# Patient Record
Sex: Female | Born: 1980 | Race: White | Hispanic: No | Smoking: Current every day smoker
Health system: Southern US, Community
[De-identification: ages and names within clinical notes are randomized; demographics above are authoritative.]

## PROBLEM LIST (undated history)

## (undated) DIAGNOSIS — I639 Cerebral infarction, unspecified: Secondary | ICD-10-CM

## (undated) DIAGNOSIS — F4310 Post-traumatic stress disorder, unspecified: Secondary | ICD-10-CM

## (undated) DIAGNOSIS — D332 Benign neoplasm of brain, unspecified: Secondary | ICD-10-CM

## (undated) DIAGNOSIS — F609 Personality disorder, unspecified: Secondary | ICD-10-CM

## (undated) DIAGNOSIS — F319 Bipolar disorder, unspecified: Secondary | ICD-10-CM

## (undated) DIAGNOSIS — R569 Unspecified convulsions: Secondary | ICD-10-CM

## (undated) DIAGNOSIS — F1910 Other psychoactive substance abuse, uncomplicated: Secondary | ICD-10-CM

## (undated) HISTORY — PX: TUMOR REMOVAL: SHX12

## (undated) HISTORY — PX: TUBAL LIGATION: SHX77

## (undated) HISTORY — PX: WRIST SURGERY: SHX841

## (undated) HISTORY — PX: LEG SURGERY: SHX1003

---

## 2008-05-12 ENCOUNTER — Emergency Department (HOSPITAL_BASED_OUTPATIENT_CLINIC_OR_DEPARTMENT_OTHER): Admission: EM | Admit: 2008-05-12 | Discharge: 2008-05-12 | Payer: Self-pay | Admitting: Emergency Medicine

## 2009-01-16 ENCOUNTER — Emergency Department (HOSPITAL_BASED_OUTPATIENT_CLINIC_OR_DEPARTMENT_OTHER): Admission: EM | Admit: 2009-01-16 | Discharge: 2009-01-16 | Payer: Self-pay | Admitting: Emergency Medicine

## 2009-01-16 ENCOUNTER — Ambulatory Visit: Payer: Self-pay | Admitting: Diagnostic Radiology

## 2009-06-24 ENCOUNTER — Emergency Department (HOSPITAL_BASED_OUTPATIENT_CLINIC_OR_DEPARTMENT_OTHER): Admission: EM | Admit: 2009-06-24 | Discharge: 2009-06-25 | Payer: Self-pay | Admitting: Emergency Medicine

## 2009-06-25 ENCOUNTER — Ambulatory Visit: Payer: Self-pay | Admitting: Radiology

## 2009-08-17 ENCOUNTER — Emergency Department (HOSPITAL_BASED_OUTPATIENT_CLINIC_OR_DEPARTMENT_OTHER): Admission: EM | Admit: 2009-08-17 | Discharge: 2009-08-17 | Payer: Self-pay | Admitting: Emergency Medicine

## 2009-08-17 ENCOUNTER — Ambulatory Visit: Payer: Self-pay | Admitting: Diagnostic Radiology

## 2010-05-11 LAB — URINALYSIS, ROUTINE W REFLEX MICROSCOPIC
Hgb urine dipstick: NEGATIVE
Ketones, ur: 40 mg/dL — AB
Protein, ur: 100 mg/dL — AB
Urobilinogen, UA: 4 mg/dL — ABNORMAL HIGH (ref 0.0–1.0)

## 2010-05-11 LAB — URINE MICROSCOPIC-ADD ON

## 2010-05-11 LAB — URINE CULTURE: Colony Count: >=100000

## 2010-05-28 LAB — URINALYSIS, ROUTINE W REFLEX MICROSCOPIC
Bilirubin Urine: NEGATIVE
Glucose, UA: NEGATIVE mg/dL
Hgb urine dipstick: NEGATIVE
Ketones, ur: NEGATIVE mg/dL
Nitrite: NEGATIVE
Protein, ur: NEGATIVE mg/dL
Specific Gravity, Urine: 1.02 (ref 1.005–1.030)
Urobilinogen, UA: 0.2 mg/dL (ref 0.0–1.0)

## 2010-05-28 LAB — DIFFERENTIAL
Eosinophils Relative: 0 % (ref 0–5)
Lymphocytes Relative: 11 % — ABNORMAL LOW (ref 12–46)

## 2010-05-28 LAB — CBC
Hemoglobin: 14.6 g/dL (ref 12.0–15.0)
MCHC: 34.5 g/dL (ref 30.0–36.0)
MCV: 84.2 fL (ref 78.0–100.0)
RBC: 5.03 MIL/uL (ref 3.87–5.11)
WBC: 9.7 10*3/uL (ref 4.0–10.5)

## 2010-05-28 LAB — COMPREHENSIVE METABOLIC PANEL
ALT: 17 U/L (ref 0–35)
Alkaline Phosphatase: 71 U/L (ref 39–117)
BUN: 13 mg/dL (ref 6–23)
Calcium: 9.4 mg/dL (ref 8.4–10.5)
Creatinine, Ser: 0.8 mg/dL (ref 0.4–1.2)
GFR calc Af Amer: >60 mL/min (ref >60)
GFR calc non Af Amer: >60 mL/min (ref >60)
Potassium: 4.4 mEq/L (ref 3.5–5.1)
Total Bilirubin: 0.6 mg/dL (ref 0.3–1.2)

## 2010-07-30 ENCOUNTER — Emergency Department (HOSPITAL_BASED_OUTPATIENT_CLINIC_OR_DEPARTMENT_OTHER)
Admission: EM | Admit: 2010-07-30 | Discharge: 2010-07-30 | Disposition: A | Discharge status: Home / Self Care | Payer: Medicaid Other | Attending: Emergency Medicine | Admitting: Emergency Medicine

## 2010-07-30 DIAGNOSIS — Z79899 Other long term (current) drug therapy: Secondary | ICD-10-CM | POA: Insufficient documentation

## 2010-07-30 DIAGNOSIS — L02219 Cutaneous abscess of trunk, unspecified: Secondary | ICD-10-CM | POA: Insufficient documentation

## 2010-07-30 DIAGNOSIS — L03319 Cellulitis of trunk, unspecified: Secondary | ICD-10-CM | POA: Insufficient documentation

## 2010-07-30 DIAGNOSIS — F319 Bipolar disorder, unspecified: Secondary | ICD-10-CM | POA: Insufficient documentation

## 2011-01-14 ENCOUNTER — Encounter: Payer: Self-pay | Admitting: *Deleted

## 2011-01-14 ENCOUNTER — Emergency Department (INDEPENDENT_AMBULATORY_CARE_PROVIDER_SITE_OTHER): Payer: Medicaid Other

## 2011-01-14 ENCOUNTER — Emergency Department (HOSPITAL_BASED_OUTPATIENT_CLINIC_OR_DEPARTMENT_OTHER)
Admission: EM | Admit: 2011-01-14 | Discharge: 2011-01-15 | Disposition: A | Discharge status: Home / Self Care | Payer: Medicaid Other | Attending: Emergency Medicine | Admitting: Emergency Medicine

## 2011-01-14 DIAGNOSIS — W1809XA Striking against other object with subsequent fall, initial encounter: Secondary | ICD-10-CM | POA: Insufficient documentation

## 2011-01-14 DIAGNOSIS — M7989 Other specified soft tissue disorders: Secondary | ICD-10-CM | POA: Insufficient documentation

## 2011-01-14 DIAGNOSIS — M79609 Pain in unspecified limb: Secondary | ICD-10-CM

## 2011-01-14 DIAGNOSIS — F17200 Nicotine dependence, unspecified, uncomplicated: Secondary | ICD-10-CM | POA: Insufficient documentation

## 2011-01-14 DIAGNOSIS — S82109A Unspecified fracture of upper end of unspecified tibia, initial encounter for closed fracture: Secondary | ICD-10-CM | POA: Insufficient documentation

## 2011-01-14 DIAGNOSIS — S82143A Displaced bicondylar fracture of unspecified tibia, initial encounter for closed fracture: Secondary | ICD-10-CM

## 2011-01-14 DIAGNOSIS — W19XXXA Unspecified fall, initial encounter: Secondary | ICD-10-CM

## 2011-01-14 HISTORY — DX: Unspecified convulsions: R56.9

## 2011-01-14 MED ORDER — HYDROMORPHONE HCL PF 1 MG/ML IJ SOLN
1.0000 mg | Freq: Once | INTRAMUSCULAR | Status: AC
  Administered 2011-01-14: 1 mg via INTRAVENOUS
  Filled 2011-01-14: qty 1

## 2011-01-14 MED ORDER — ONDANSETRON HCL 4 MG/2ML IJ SOLN
4.0000 mg | Freq: Once | INTRAMUSCULAR | Status: AC
  Administered 2011-01-14: 4 mg via INTRAVENOUS
  Filled 2011-01-14: qty 2

## 2011-01-14 MED ORDER — OXYCODONE-ACETAMINOPHEN 5-325 MG PO TABS
2.0000 | ORAL_TABLET | Freq: Once | ORAL | Status: AC
  Administered 2011-01-15: 2 via ORAL
  Filled 2011-01-14: qty 2

## 2011-01-14 MED ORDER — OXYCODONE-ACETAMINOPHEN 5-325 MG PO TABS: 2.0000 | ORAL_TABLET | ORAL | Status: AC | PRN

## 2011-01-14 MED ORDER — MORPHINE SULFATE 4 MG/ML IJ SOLN
6.0000 mg | Freq: Once | INTRAMUSCULAR | Status: AC
  Administered 2011-01-14: 4 mg via INTRAVENOUS
  Filled 2011-01-14: qty 1

## 2011-01-14 MED ORDER — MORPHINE SULFATE 2 MG/ML IJ SOLN
INTRAMUSCULAR | Status: AC
  Administered 2011-01-14: 2 mg via INTRAVENOUS
  Filled 2011-01-14: qty 1

## 2011-01-14 MED ORDER — LIDOCAINE HCL (PF) 1 % IJ SOLN
INTRAMUSCULAR | Status: AC
  Administered 2011-01-15: 2 mL via SUBCUTANEOUS
  Filled 2011-01-14: qty 5

## 2011-01-14 NOTE — ED Notes (Signed)
Family at bedside. 

## 2011-01-14 NOTE — ED Notes (Signed)
Pt presents to ED today after jumping a ditch playing with her kids.  Pt has noted deformity and swelling to outer aspect of RLE.  Pt reports pain extending up to knee and around to inner aspect.  No pain or swelling noted to foot

## 2011-01-14 NOTE — ED Provider Notes (Addendum)
History     CSN: [id number] Arrival date & time: 01/14/2011  6:56 PM   First MD Initiated Contact with Patient 01/14/11 1920      Chief Complaint  Patient presents with  . Leg Injury    (Consider location/radiation/quality/duration/timing/severity/associated sxs/prior treatment) HPI Comments: Pt did not hit head.  No neck or back pain  Patient is a 30 y.o. female presenting with fall.  Fall The accident occurred less than 1 hour ago. The fall occurred while walking (pt was trying to jump over ditch, hit root, twisted knee). She landed on dirt. The point of impact was the right knee. The pain is present in the right knee. The pain is severe. She was not ambulatory at the scene. Pertinent negatives include no fever, no numbness, no abdominal pain, no nausea, no vomiting, no hematuria, no headaches and no loss of consciousness. She has tried nothing for the symptoms.    Past Medical History  Diagnosis Date  . Seizures     Past Surgical History  Procedure Date  . Tubal ligation   . Tumor removal     No family history on file.  History  Substance Use Topics  . Smoking status: Current Everyday Smoker  . Smokeless tobacco: Not on file  . Alcohol Use: Yes    OB History    Grav Para Term Preterm Abortions TAB SAB Ect Mult Living                  Review of Systems  Constitutional: Negative for fever, chills, diaphoresis and fatigue.  HENT: Negative for congestion, rhinorrhea and sneezing.   Eyes: Negative.   Respiratory: Negative for cough, chest tightness and shortness of breath.   Cardiovascular: Negative for chest pain and leg swelling.  Gastrointestinal: Negative for nausea, vomiting, abdominal pain, diarrhea and blood in stool.  Genitourinary: Negative for frequency, hematuria, flank pain and difficulty urinating.  Musculoskeletal: Positive for joint swelling. Negative for back pain and arthralgias.  Skin: Negative for rash.  Neurological: Negative for dizziness,  loss of consciousness, speech difficulty, weakness, numbness and headaches.    Allergies  Sulfa antibiotics and Vicodin  Home Medications   Current Outpatient Rx  Name Route Sig Dispense Refill  . CLONAZEPAM 1 MG PO TABS Oral Take 1 mg by mouth 2 (two) times daily as needed. For anxiety     . ESCITALOPRAM OXALATE 20 MG PO TABS Oral Take 20 mg by mouth daily.      Marland Kitchen HYDROXYZINE PAMOATE 25 MG PO CAPS Oral Take 25 mg by mouth 2 (two) times daily.      Marland Kitchen LEVETIRACETAM 750 MG PO TABS Oral Take 750 mg by mouth every 12 (twelve) hours.      . TOPIRAMATE 50 MG PO TABS Oral Take 50-100 mg by mouth 2 (two) times daily. Take 1 tab in the morning and 2 tabs in the evening     . OXYCODONE-ACETAMINOPHEN 5-325 MG PO TABS Oral Take 2 tablets by mouth every 4 (four) hours as needed for pain. 30 tablet 0    BP 110/81  Pulse 98  Temp 98.9 F (37.2 C)  Resp 18  Ht 5\' 6"  (1.676 m)  Wt 130 lb (58.968 kg)  BMI 20.98 kg/m2  SpO2 99%  LMP 12/14/2010  Physical Exam  Constitutional: She is oriented to person, place, and time. She appears well-developed and well-nourished.  HENT:  Head: Normocephalic and atraumatic.  Eyes: Pupils are equal, round, and reactive to light.  Neck:  Normal range of motion. Neck supple.  Cardiovascular: Normal rate, regular rhythm and normal heart sounds.   Pulmonary/Chest: Effort normal and breath sounds normal. No respiratory distress. She has no wheezes. She has no rales. She exhibits no tenderness.  Abdominal: Soft. Bowel sounds are normal. There is no tenderness. There is no rebound and no guarding.  Musculoskeletal: Normal range of motion. She exhibits no edema.       Moderate swelling to knee/tib fib area on right.  Compartments soft.  Able to move foot without significant pain to compartments.  Good pulses in foot, normal sensation.  No open wounds  Lymphadenopathy:    She has no cervical adenopathy.  Neurological: She is alert and oriented to person, place, and time.   Skin: Skin is warm and dry. No rash noted.  Psychiatric: She has a normal mood and affect.    ED Course  ARTHOCENTESIS Date/Time: 01/14/2011 11:30 PM Performed by: Rolan Bucco Authorized by: Rolan Bucco Consent: Verbal consent obtained. Risks and benefits: risks, benefits and alternatives were discussed Consent given by: patient Patient understanding: patient states understanding of the procedure being performed Patient consent: the patient's understanding of the procedure matches consent given Procedure consent: procedure consent matches procedure scheduled Patient identity confirmed: verbally with patient Time out: Immediately prior to procedure a "time out" was called to verify the correct patient, procedure, equipment, support staff and site/side marked as required. Indications: joint swelling and pain  Body area: knee Joint: right knee Local anesthesia used: yes Anesthesia: local infiltration Local anesthetic: lidocaine 1% without epinephrine Anesthetic total: 3 ml Patient sedated: no Needle gauge: 18 G Approach: lateral Aspirate: bloody Aspirate amount: 50 ml Patient tolerance: Patient tolerated the procedure well with no immediate complications. Comments: Pain was improved after procedure   (including critical care time)  Labs Reviewed - No data to display Dg Tibia/fibula Right  01/14/2011  *RADIOLOGY REPORT*  Clinical Data: Fall, pain and swelling.  RIGHT TIBIA AND FIBULA - 2 VIEW  Comparison: None.  Findings: The patient has a fracture of the proximal tibia.  The fracture is best visualized exiting the medial tibial metaphysis and extending between the tibial eminences.  There is also a fracture of the fibular head.  Associated lipohemarthrosis noted.  IMPRESSION: Medial tibial plateau and fibular head fractures with associated lipohemarthrosis.  Original Report Authenticated By: Bernadene Bell. D'ALESSIO, M.D.     1. Tibial plateau fracture     Ortho paged, pt  given morphine, dilaudid for pain  MDM  Tibial plateau fx, no evidence of compartment syndrome   Spoke with Dr. Priscille Kluver who feels that pt can go home, f/u on Monday in their office.  Pt placed in knee immobilizer, advised absolutely non-weight bearing, keep elevated, ice, f/u with ortho on Monday.  Return for any increasing pain.  Advised symptoms to watch for for compartment syndrome.  Dr. Priscille Kluver recommended arthrocentesis of knee for better pain control which was done here.     Rolan Bucco, MD 01/14/11 1610  Rolan Bucco, MD 01/14/11 [id number]  Rolan Bucco, MD 01/15/11 0030

## 2011-01-14 NOTE — ED Notes (Signed)
Pt was playing with her kids and jumped over a ditch. Pt now has pain and swelling to her right lower leg just below her knee.

## 2011-01-14 NOTE — ED Notes (Signed)
Patient transported to X-ray 

## 2011-01-14 NOTE — ED Notes (Signed)
Pt given Dilaudid for unchanged pain 10/10

## 2011-01-14 NOTE — ED Notes (Signed)
Pt given additional ice bags and pillow and blankets for comfort.

## 2011-01-15 MED ORDER — LIDOCAINE HCL (PF) 1 % IJ SOLN
2.0000 mL | Freq: Once | INTRAMUSCULAR | Status: AC
  Administered 2011-01-15: 2 mL via SUBCUTANEOUS

## 2011-01-15 NOTE — ED Notes (Signed)
Extensive crutch teaching and mobility assistance giventopt adn family.  Assisted pt to car with assitance of security.

## 2012-11-21 ENCOUNTER — Emergency Department (HOSPITAL_BASED_OUTPATIENT_CLINIC_OR_DEPARTMENT_OTHER)
Admission: EM | Admit: 2012-11-21 | Discharge: 2012-11-21 | Disposition: A | Discharge status: Home / Self Care | Payer: Medicaid Other | Attending: Emergency Medicine | Admitting: Emergency Medicine

## 2012-11-21 ENCOUNTER — Encounter (HOSPITAL_BASED_OUTPATIENT_CLINIC_OR_DEPARTMENT_OTHER): Payer: Self-pay | Admitting: *Deleted

## 2012-11-21 DIAGNOSIS — X503XXA Overexertion from repetitive movements, initial encounter: Secondary | ICD-10-CM | POA: Insufficient documentation

## 2012-11-21 DIAGNOSIS — F17200 Nicotine dependence, unspecified, uncomplicated: Secondary | ICD-10-CM | POA: Insufficient documentation

## 2012-11-21 DIAGNOSIS — Y929 Unspecified place or not applicable: Secondary | ICD-10-CM | POA: Insufficient documentation

## 2012-11-21 DIAGNOSIS — Y990 Civilian activity done for income or pay: Secondary | ICD-10-CM | POA: Insufficient documentation

## 2012-11-21 DIAGNOSIS — Z8673 Personal history of transient ischemic attack (TIA), and cerebral infarction without residual deficits: Secondary | ICD-10-CM | POA: Insufficient documentation

## 2012-11-21 DIAGNOSIS — Z79899 Other long term (current) drug therapy: Secondary | ICD-10-CM | POA: Insufficient documentation

## 2012-11-21 DIAGNOSIS — Y9389 Activity, other specified: Secondary | ICD-10-CM | POA: Insufficient documentation

## 2012-11-21 DIAGNOSIS — G40909 Epilepsy, unspecified, not intractable, without status epilepticus: Secondary | ICD-10-CM | POA: Insufficient documentation

## 2012-11-21 DIAGNOSIS — IMO0002 Reserved for concepts with insufficient information to code with codable children: Secondary | ICD-10-CM | POA: Insufficient documentation

## 2012-11-21 HISTORY — DX: Cerebral infarction, unspecified: I63.9

## 2012-11-21 HISTORY — DX: Benign neoplasm of brain, unspecified: D33.2

## 2012-11-21 NOTE — ED Notes (Signed)
Pt presents with blisters on both feet from new shoes

## 2012-11-21 NOTE — ED Notes (Signed)
D/c home with rx x 1 for antibiotic/lisocaine cream. Wound care discussed

## 2012-11-21 NOTE — ED Provider Notes (Addendum)
CSN: [id number]     Arrival date & time 11/21/12  0124 History   First MD Initiated Contact with Patient 11/21/12 0138     Chief Complaint  Patient presents with  . 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 She bought some new shoes recently and worked in them for the past 2 days. She has rubbed blisters onto the dorsal medial aspects of both feet as well as the dorsal aspect of the left little toe. These blisters are open. She states she is having 9/10 pain, particularly when she walks in her shoes rubbed them. She is planning on avoiding work until these heal but requests help with the pain.  Past Medical History  Diagnosis Date  . Seizures     last seizure 2 weeks ago  . Stroke   . Brain tumor (benign)    Past Surgical History  Procedure Laterality Date  . Tubal ligation    . Tumor removal    . Leg surgery     No family history on file. History  Substance Use Topics  . Smoking status: Current Every Day Smoker    Types: Cigarettes  . Smokeless tobacco: Never Used  . Alcohol Use: No   OB History   Grav Para Term Preterm Abortions TAB SAB Ect Mult Living                 Review of Systems  All other systems reviewed and are negative.    Allergies  Sulfa antibiotics and Vicodin  Home Medications   Current Outpatient Rx  Name  Route  Sig  Dispense  Refill  . clonazePAM (KLONOPIN) 1 MG tablet   Oral   Take 1 mg by mouth 2 (two) times daily as needed. For anxiety          . escitalopram (LEXAPRO) 20 MG tablet   Oral   Take 20 mg by mouth daily.           . hydrOXYzine (VISTARIL) 25 MG capsule   Oral   Take 25 mg by mouth 2 (two) times daily.           Marland Kitchen levETIRAcetam (KEPPRA) 750 MG tablet   Oral   Take 750 mg by mouth every 12 (twelve) hours.           . topiramate (TOPAMAX) 50 MG tablet   Oral   Take 50-100 mg by mouth 2  (two) times daily. Take 1 tab in the morning and 2 tabs in the evening           BP 119/92  Pulse 100  Temp(Src) 98.3 F (36.8 C) (Oral)  Resp 20  SpO2 99%  LMP 11/07/2012  Physical Exam General: Well-developed, well-nourished female in no acute distress; appearance consistent with age of record HENT: normocephalic; atraumatic Eyes: pupils equal, round and reactive to light; extraocular muscles intact Neck: supple Heart: regular rate and rhythm Lungs: Normal respiratory effort and excursion Abdomen: soft; nondistended Extremities: No deformity; full range of motion Neurologic: Awake, alert and oriented; motor function intact in all extremities and symmetric; no facial droop Skin: Warm and dry; friction wounds of the dorsal medial aspects of both feet, approximately 0.5 cm x 3 cm without evidence of infection; friction wound of the dorsal left middle toe without evidence of infection Psychiatric: Normal mood and affect    ED Course  Procedures (including critical care time)  MDM  We'll treat with topical lidocaine 5% ointment mixed one-to-one with Neosporin ointment. Paper prescription written.    Hanley Seamen, MD 11/21/12 0141  Hanley Seamen, MD 11/21/12 9604

## 2013-06-14 ENCOUNTER — Encounter (HOSPITAL_BASED_OUTPATIENT_CLINIC_OR_DEPARTMENT_OTHER): Payer: Self-pay | Admitting: Emergency Medicine

## 2013-06-14 ENCOUNTER — Emergency Department (HOSPITAL_BASED_OUTPATIENT_CLINIC_OR_DEPARTMENT_OTHER)
Admission: EM | Admit: 2013-06-14 | Discharge: 2013-06-14 | Disposition: A | Discharge status: Home / Self Care | Payer: Medicaid Other | Attending: Emergency Medicine | Admitting: Emergency Medicine

## 2013-06-14 DIAGNOSIS — Z86011 Personal history of benign neoplasm of the brain: Secondary | ICD-10-CM | POA: Insufficient documentation

## 2013-06-14 DIAGNOSIS — F17200 Nicotine dependence, unspecified, uncomplicated: Secondary | ICD-10-CM | POA: Insufficient documentation

## 2013-06-14 DIAGNOSIS — R5383 Other fatigue: Secondary | ICD-10-CM

## 2013-06-14 DIAGNOSIS — IMO0002 Reserved for concepts with insufficient information to code with codable children: Secondary | ICD-10-CM | POA: Insufficient documentation

## 2013-06-14 DIAGNOSIS — R110 Nausea: Secondary | ICD-10-CM | POA: Insufficient documentation

## 2013-06-14 DIAGNOSIS — R5381 Other malaise: Secondary | ICD-10-CM | POA: Insufficient documentation

## 2013-06-14 DIAGNOSIS — G40909 Epilepsy, unspecified, not intractable, without status epilepticus: Secondary | ICD-10-CM | POA: Insufficient documentation

## 2013-06-14 DIAGNOSIS — Z79899 Other long term (current) drug therapy: Secondary | ICD-10-CM | POA: Insufficient documentation

## 2013-06-14 DIAGNOSIS — Z8673 Personal history of transient ischemic attack (TIA), and cerebral infarction without residual deficits: Secondary | ICD-10-CM | POA: Insufficient documentation

## 2013-06-14 DIAGNOSIS — L02414 Cutaneous abscess of left upper limb: Secondary | ICD-10-CM

## 2013-06-14 MED ORDER — IBUPROFEN 800 MG PO TABS
800.0000 mg | ORAL_TABLET | Freq: Once | ORAL | Status: AC
  Administered 2013-06-14: 800 mg via ORAL
  Filled 2013-06-14: qty 1

## 2013-06-14 MED ORDER — CEPHALEXIN 500 MG PO CAPS: 500.0000 mg | ORAL_CAPSULE | Freq: Two times a day (BID) | ORAL | Status: DC

## 2013-06-14 NOTE — ED Provider Notes (Signed)
CSN: [id number]     Arrival date & time 06/14/13  1755 History   First MD Initiated Contact with Patient 06/14/13 1805     Chief Complaint  Patient presents with  . Abscess    HPI  Heather Barton is a 33 y.o. female with a PMH of seizures, stroke and benign brain tumor who presents to the ED for evaluation of abscess. History was provided by the patient. Patient states that she has had an abscess to her left shoulder for the past week. Pain and redness has been gradually worsening. Minimal purulent drainage. Last week she had itching to her arms and back, which resulted in multiple scabs after vigorous scratching. She states she "just feet itchy." She denies any rash. She states she may have had a new soap which could have caused this. She states she has had abscesses in the past on her thighs and above her right eye. She denies any history of diabetes. She states she has been feeling tired and a little nauseated however denies any fever, chills, vomiting, abdominal pain or other concerns. She denies any IV drug use. Her tetanus is up-to-date.    Past Medical History  Diagnosis Date  . Seizures     last seizure 2 weeks ago  . Stroke   . Brain tumor (benign)    Past Surgical History  Procedure Laterality Date  . Tubal ligation    . Tumor removal    . Leg surgery     History reviewed. No pertinent family history. History  Substance Use Topics  . Smoking status: Current Every Day Smoker -- 0.50 packs/day    Types: Cigarettes  . Smokeless tobacco: Never Used  . Alcohol Use: No   OB History   Grav Para Term Preterm Abortions TAB SAB Ect Mult Living                 Review of Systems  Constitutional: Positive for fatigue. Negative for fever, chills, activity change and appetite change.  Gastrointestinal: Positive for nausea. Negative for vomiting and abdominal pain.  Musculoskeletal: Negative for arthralgias, back pain and myalgias.  Skin: Positive for color change and wound.  Negative for rash.  Neurological: Negative for weakness, light-headedness and headaches.    Allergies  Sulfa antibiotics and Vicodin  Home Medications   Prior to Admission medications   Medication Sig Start Date End Date Taking? Authorizing Provider  omeprazole (PRILOSEC) 40 MG capsule Take 40 mg by mouth daily.   Yes Historical Provider, MD  clonazePAM (KLONOPIN) 1 MG tablet Take 1 mg by mouth 2 (two) times daily as needed. For anxiety     Historical Provider, MD  dexmethylphenidate (FOCALIN XR) 15 MG 24 hr capsule Take 30 mg by mouth daily.    Historical Provider, MD  escitalopram (LEXAPRO) 20 MG tablet Take 20 mg by mouth daily.      Historical Provider, MD  gabapentin (NEURONTIN) 300 MG capsule Take 300 mg by mouth 3 (three) times daily.    Historical Provider, MD  hydrOXYzine (VISTARIL) 25 MG capsule Take 25 mg by mouth 2 (two) times daily.      Historical Provider, MD  levETIRAcetam (KEPPRA) 750 MG tablet Take 1,000 mg by mouth every 12 (twelve) hours.     Historical Provider, MD  Sertraline HCl (ZOLOFT PO) Take by mouth.    Historical Provider, MD  topiramate (TOPAMAX) 50 MG tablet Take 50-100 mg by mouth 2 (two) times daily. Take 1 tab in the morning  and 2 tabs in the evening     Historical Provider, MD   BP 135/82  Pulse 100  Temp(Src) 97.7 F (36.5 C) (Oral)  Resp 18  Ht 5\' 7"  (1.702 m)  Wt 145 lb (65.772 kg)  BMI 22.71 kg/m2  SpO2 100%  LMP 06/07/2013  Filed Vitals:   06/14/13 1758  BP: 135/82  Pulse: 100  Temp: 97.7 F (36.5 C)  TempSrc: Oral  Resp: 18  Height: 5\' 7"  (1.702 m)  Weight: 145 lb (65.772 kg)  SpO2: 100%    Physical Exam  Nursing note and vitals reviewed. Constitutional: She is oriented to person, place, and time. She appears well-developed and well-nourished. No distress.  Nontoxic  HENT:  Head: Normocephalic and atraumatic.  Right Ear: External ear normal.  Left Ear: External ear normal.  Mouth/Throat: Oropharynx is clear and moist.   Eyes: Conjunctivae are normal. Right eye exhibits no discharge. Left eye exhibits no discharge.  Neck: Neck supple.  Cardiovascular: Normal rate, regular rhythm and normal heart sounds.  Exam reveals no gallop and no friction rub.   No murmur heard. Pulmonary/Chest: Effort normal and breath sounds normal. No respiratory distress. She has no wheezes. She has no rales. She exhibits no tenderness.  Abdominal: Soft. She exhibits no distension. There is no tenderness.  Musculoskeletal: Normal range of motion. She exhibits no edema and no tenderness.       Back:  Neurological: She is alert and oriented to person, place, and time.  Skin: Skin is warm and dry. She is not diaphoretic.  4 cm x 4 cm circular area of erythema and induration to the left superior posterior shoulder. Closed scab present in the center of the area. Area is tender to palpation. No open lesion or drainage. Multiple scabs and scratch marks present in the arms and upper back. No rash    ED Course  INCISION AND DRAINAGE Date/Time: 06/14/2013 7:00 PM Performed by: Vernie Murders K Authorized by: Vernie Murders K Consent: Verbal consent obtained. Consent given by: patient Type: abscess Body area: upper extremity Location details: left shoulder Anesthesia: local infiltration Local anesthetic: lidocaine 2% without epinephrine Anesthetic total: 3 ml Patient sedated: no Risk factor: underlying major vessel Scalpel size: 11 Incision type: single straight Complexity: simple Drainage: purulent Drainage amount: scant Wound treatment: drain placed Packing material: 1/4 in gauze Patient tolerance: Patient tolerated the procedure well with no immediate complications.   (including critical care time) Labs Review Labs Reviewed - No data to display  Imaging Review No results found.   EKG Interpretation None      MDM   Heather Barton is a 33 y.o. female with a PMH of seizures, stroke and benign brain tumor who presents  to the ED for evaluation of abscess. Abscess drained in the ED and packed. Patient instructed to return to the ED in 2 days or follow-up with her PCP for packing removal. Wound care discussed with patient. Patient instructed to keep skin clean and dry and take Benadryl as needed for itching. No rash on exam. Patient afebrile and non-toxic in appearance. Due to multiple areas of skin lesions will treat with Keflex. Return precautions, discharge instructions, and follow-up was discussed with the patient before discharge.    Rechecks  7:30 PM = Patient upset about not getting anything for pain. Offered patient many options, however, she refused. Did not feel patient required narcotics at this time.     New Prescriptions   CEPHALEXIN (KEFLEX) 500 MG CAPSULE  Take 1 capsule (500 mg total) by mouth 2 (two) times daily.    Final impressions: 1. Abscess of left shoulder       Harold Hedge Evelyne Makepeace PA-C          Lucila Maine, PA-C 06/16/13 1133

## 2013-06-14 NOTE — Discharge Instructions (Signed)
Take antibiotics for full dose  Packing removal in 2 days! Warm compresses to abscess areas  Keep area clean and dry  Take Ibuprofen or Tylenol as needed for pain control  Apply antibiotic ointment to skin lesions  Avoid scratching skin - take benadryl for itching if needed  Return to the emergency department if you develop any changing/worsening condition, fever, spreading redness/swelling, increased drainage, or any other concerns (please read additional information regarding your condition below)  Abscess An abscess is an infected area that contains a collection of pus and debris.It can occur in almost any part of the body. An abscess is also known as a furuncle or boil. CAUSES  An abscess occurs when tissue gets infected. This can occur from blockage of oil or sweat glands, infection of hair follicles, or a minor injury to the skin. As the body tries to fight the infection, pus collects in the area and creates pressure under the skin. This pressure causes pain. People with weakened immune systems have difficulty fighting infections and get certain abscesses more often.  SYMPTOMS Usually an abscess develops on the skin and becomes a painful mass that is red, warm, and tender. If the abscess forms under the skin, you may feel a moveable soft area under the skin. Some abscesses break open (rupture) on their own, but most will continue to get worse without care. The infection can spread deeper into the body and eventually into the bloodstream, causing you to feel ill.  DIAGNOSIS  Your caregiver will take your medical history and perform a physical exam. A sample of fluid may also be taken from the abscess to determine what is causing your infection. TREATMENT  Your caregiver may prescribe antibiotic medicines to fight the infection. However, taking antibiotics alone usually does not cure an abscess. Your caregiver may need to make a small cut (incision) in the abscess to drain the pus. In some  cases, gauze is packed into the abscess to reduce pain and to continue draining the area. HOME CARE INSTRUCTIONS   Only take over-the-counter or prescription medicines for pain, discomfort, or fever as directed by your caregiver.  If you were prescribed antibiotics, take them as directed. Finish them even if you start to feel better.  If gauze is used, follow your caregiver's directions for changing the gauze.  To avoid spreading the infection:  Keep your draining abscess covered with a bandage.  Wash your hands well.  Do not share personal care items, towels, or whirlpools with others.  Avoid skin contact with others.  Keep your skin and clothes clean around the abscess.  Keep all follow-up appointments as directed by your caregiver. SEEK MEDICAL CARE IF:   You have increased pain, swelling, redness, fluid drainage, or bleeding.  You have muscle aches, chills, or a general ill feeling.  You have a fever. MAKE SURE YOU:   Understand these instructions.  Will watch your condition.  Will get help right away if you are not doing well or get worse. Document Released: 11/19/2004 Document Revised: 08/11/2011 Document Reviewed: 04/24/2011 [facility] Patient Information 2014 [place].   Emergency Department Resource Guide 1) Find a Doctor and Pay Out of Pocket Although you won't have to find out who is covered by your insurance plan, it is a good idea to ask around and get recommendations. You will then need to call the office and see if the doctor you have chosen will accept you as a new patient and what types of options they  offer for patients who are self-pay. Some doctors offer discounts or will set up payment plans for their patients who do not have insurance, but you will need to ask so you aren't surprised when you get to your appointment.  2) Contact Your Local Health Department Not all health departments have doctors that can see patients for sick visits, but many  do, so it is worth a call to see if yours does. If you don't know where your local health department is, you can check in your phone book. The CDC also has a tool to help you locate your state's health department, and many state websites also have listings of all of their local health departments.  3) Find a [facility] If your illness is not likely to be very severe or complicated, you may want to try a walk in clinic. These are popping up all over the country in pharmacies, drugstores, and shopping centers. They're usually staffed by nurse practitioners or physician assistants that have been trained to treat common illnesses and complaints. They're usually fairly quick and inexpensive. However, if you have serious medical issues or chronic medical problems, these are probably not your best option.  No Primary Care Doctor: - Call Health Connect at  [phone] - they can help you locate a primary care doctor that  accepts your insurance, provides certain services, etc. - Physician Referral Service- [phone]  Chronic Pain Problems: Organization         Address  Phone   Notes  [facility]  [phone] Patients need to be referred by their primary care doctor.   Medication Assistance: Organization         Address  Phone   Notes  [facility] Medication [facility] [address] [place], [place] [address] [phone] --Must be a resident of [facility] -- Must have NO insurance coverage whatsoever (no Medicaid/ Medicare, etc.) -- The pt. MUST have a primary care doctor that directs their care regularly and follows them in the community   [facility]  [phone]   [employer]  [phone]    Agencies that provide inexpensive medical care: Organization         Address  Phone   Notes  [place]  [phone]   [facility]    [phone]   [facility] [place], [place] [address] [phone]   [place] [address], [place] [phone]   Planned Parenthood    [phone]   [facility]    [phone]   [address], [place] Phone:  [phone], Fax:  [phone] Hours of Operation:  9 am - 6 pm, M-F.  Also accepts Medicaid/Medicare and self-pay.  [facility] for [address], [place] Phone: [phone], Fax: [phone]. Hours of Operation:  8:30 am - 5:30 pm, M-F.  Also accepts Medicaid and self-pay.  [facility] [address], [place] Phone: [phone]   [place], [place], [place] [phone], Ext. [phone] Mondays & Thursdays: 7-9 AM.  First 15 patients are seen on a first come, first serve basis.    [place] Providers:  Organization         Address  Phone   Notes  [facility] [address], [place] [phone] Also accepts self-pay patients.  [facility] [id number] [place], [place]  [phone]   [place], [address], [place] [phone]   [facility] [address], [place] [phone]   Lucianne Lei [address], [place]   [phone] Only accepts [place] Access [place] patients after they have their name applied to their card.   Self-Pay (no insurance) in [facility]:  Organization         Address  Phone   Notes  Sickle Cell Patients, [facility] Internal Medicine [place] [phone]   [facility] [place] [phone]   [facility] [place]  [place], [place], [place] [phone]   [facility]/Dr. Osei-Bonsu  [address] [place] or  [address], [place] [phone] Phone number for both [place] and [place] locations is the same.  Urgent Medical and [facility] [address], [place] [phone]   [facility] [address], [place] or [address] [phone]   [facility] [address] [place] [phone], phone; [phone], fax Sees patients 1st and 3rd Saturday of every month.  Must not qualify for public or private insurance (i.e. Medicaid, Medicare, [place] Health Choice, Veterans' Benefits)  Household income should be no more than 200% of the poverty level The clinic cannot treat you if you are pregnant or think you are pregnant  Sexually transmitted diseases are not treated at the clinic.    Dental Care: Organization         Address  Phone  Notes  [facility] [place] [phone] Accepts children up to age 10 who are enrolled in [place] or [place]; pregnant women with a Medicaid card; and children who have applied for Medicaid or [place] Health Choice, but were declined, whose parents can pay a reduced fee at time of service.  [facility] Department of [facility]  [address], [place] [phone] Accepts children up to age 22 who are enrolled in [place] or [place]; pregnant women with a Medicaid card; and children who have applied for Medicaid or [place] Health Choice, but were declined, whose parents can pay a reduced fee at time of service.  [place] Adult Dental Access PROGRAM  [place] [phone] Patients are seen by appointment only. Walk-ins are not accepted. [place] will see patients 61 years of age and older. Monday - Tuesday (8am-5pm) Most Wednesdays (8:30-5pm) $30 per visit, cash only  [facility] Adult Dental Access PROGRAM  [address], [facility] [phone] Patients are seen by appointment only. Walk-ins are not accepted. [place] will see patients 28 years of age and older. One Wednesday Evening (Monthly: Volunteer Based).  $30 per visit, cash only  [place]  [phone] for adults; Children under age 70, call Graduate Pediatric Dentistry at [phone]. Children aged 67-14, please call [phone] to request a pediatric application.  Dental services are provided in all areas of dental care including fillings, crowns and bridges, complete and partial dentures, implants,  gum treatment, root canals, and extractions. Preventive care is also provided. Treatment is provided to both adults and children. Patients are selected via a lottery and there is often a waiting list.   [facility] [address] [place]  [phone] [email or web address]   [facility] [address] [place], [place] [phone], Ext. [phone] Second and Fourth Thursday of each month, opens at 6:30 AM; Clinic ends at 9 AM.  Patients are seen on a first-come first-served basis, and a limited number are seen during each clinic.   [facility]  [address] Hillard Danker [place], [place] [phone]   Eligibility Requirements You must have lived in [place], [place], or [place] counties for at least the last three months.   You cannot be eligible for state or federal sponsored [employer], including [employer], [place], or [employer].   You generally cannot be eligible for healthcare insurance through your employer.    How to apply: Eligibility screenings are held every Tuesday and Wednesday afternoon from 1:00 pm until 4:00 pm. You do not need an appointment for the interview!  [facility] [address] [place], [place]   [place]  [place] Department  [place]  [phone]    Behavioral Health Resources in the Community: Intensive Outpatient Programs Organization         Address  Phone  Notes  [place] Bonsall. [address], [place], [place] [phone]   [facility] [address] [place], [place]   ADS: Alcohol & Drug S[facility] [address] [place], [place]   [place] [address]  [place], [place] or [phone]   Substance Abuse Resources Organization         Address  Phone  Notes  Alcohol and Drug Services  [phone]   [place]  [phone]   [facility] [place]   Chinita Pester  [phone]   Residential & Outpatient Substance Abuse Program  [phone]   Psychological Services Organization         Address  Phone  Notes  [facility] [place]  [place]  [phone]   [place] [address], [place] or [phone]    Mobile Crisis Teams Organization         Address  Phone  Notes  [facility], Mobile Crisis Care Unit  [phone]   [facility]  [address] [place], [place]   [place] [address], [place] [phone]    Self-Help/Support Groups Organization         Address  Phone             Notes  [place]. of [place] - variety of support groups  [place] Call for more information  Narcotics Anonymous (NA), [facility] [address], [employer] [place]  2 meetings at this location   Special educational needs teacher         Address  Phone  Notes  ASAP Residential Treatment [place],    [place]  [place]  [address], [place], [place], [place]   [place], [place] [phone]  Admissions: 8am-3pm M-F  Incentives Substance  Abuse Treatment Center [address],    [place], [place] [phone]   [facility] [address] [place], [place], [place]   [facility] [address]  [place], [place]   [facility] Programs - Intensive Outpatient [address], [place], [place]   [facility] ([place].,  [place], [place] [phone] or [phone]   [facility]idential Treatment Services (RTS) [address], [place], [place] Accepts Medicaid  [facility] [place] [address],  [place] [phone] Substance Abuse/Addiction Treatment   [facility] The Organization         Address  Phone  Notes  [facility]  [phone]   Domenic Schwab, PhD [address] Arlis Porta [place], [place]   [phone] or [phone]   [place], [place] [phone]   [facility] [address] [place], [place] [phone] Insurance/Medicaid/sponsorship through [facility] and Families [address], [address] [place]                                    [place], [place] [phone] [place] [address], [place] [phone]    Dr. Adele Schilder  [phone]   [facility] Dept. 1) [address], [place] 2) [place] 3)  [place] [address], [place] [phone]  [phone]   [place] [phone] or [phone] (After Hours)

## 2013-06-14 NOTE — ED Notes (Signed)
Pt angry that she did not get pain medication.  Asked to speak to PA.  Janett Billow informed.

## 2013-06-14 NOTE — ED Notes (Signed)
Pt angry after speaking to Webb.  Pt refused to sign out at discharge.

## 2013-06-14 NOTE — ED Notes (Signed)
Pt c/o abscess to left shoulder x 1 week

## 2013-06-17 NOTE — ED Provider Notes (Signed)
Medical screening examination/treatment/procedure(s) were performed by non-physician practitioner and as supervising physician I was immediately available for consultation/collaboration.   EKG Interpretation None        Malvin Johns, MD 06/17/13 [phone]

## 2014-03-22 ENCOUNTER — Emergency Department (HOSPITAL_BASED_OUTPATIENT_CLINIC_OR_DEPARTMENT_OTHER)
Admission: EM | Admit: 2014-03-22 | Discharge: 2014-03-22 | Disposition: A | Discharge status: Home / Self Care | Payer: Medicaid Other | Attending: Emergency Medicine | Admitting: Emergency Medicine

## 2014-03-22 ENCOUNTER — Encounter (HOSPITAL_BASED_OUTPATIENT_CLINIC_OR_DEPARTMENT_OTHER): Payer: Self-pay | Admitting: *Deleted

## 2014-03-22 DIAGNOSIS — B9789 Other viral agents as the cause of diseases classified elsewhere: Secondary | ICD-10-CM | POA: Insufficient documentation

## 2014-03-22 DIAGNOSIS — Z720 Tobacco use: Secondary | ICD-10-CM | POA: Insufficient documentation

## 2014-03-22 DIAGNOSIS — G40909 Epilepsy, unspecified, not intractable, without status epilepticus: Secondary | ICD-10-CM | POA: Insufficient documentation

## 2014-03-22 DIAGNOSIS — F4310 Post-traumatic stress disorder, unspecified: Secondary | ICD-10-CM | POA: Diagnosis not present

## 2014-03-22 DIAGNOSIS — Z792 Long term (current) use of antibiotics: Secondary | ICD-10-CM | POA: Insufficient documentation

## 2014-03-22 DIAGNOSIS — J028 Acute pharyngitis due to other specified organisms: Secondary | ICD-10-CM | POA: Insufficient documentation

## 2014-03-22 DIAGNOSIS — J029 Acute pharyngitis, unspecified: Secondary | ICD-10-CM

## 2014-03-22 DIAGNOSIS — Z8673 Personal history of transient ischemic attack (TIA), and cerebral infarction without residual deficits: Secondary | ICD-10-CM | POA: Diagnosis not present

## 2014-03-22 DIAGNOSIS — J069 Acute upper respiratory infection, unspecified: Secondary | ICD-10-CM | POA: Insufficient documentation

## 2014-03-22 DIAGNOSIS — Z79899 Other long term (current) drug therapy: Secondary | ICD-10-CM | POA: Insufficient documentation

## 2014-03-22 DIAGNOSIS — Z86011 Personal history of benign neoplasm of the brain: Secondary | ICD-10-CM | POA: Diagnosis not present

## 2014-03-22 HISTORY — DX: Post-traumatic stress disorder, unspecified: F43.10

## 2014-03-22 LAB — RAPID STREP SCREEN (MED CTR MEBANE ONLY): STREPTOCOCCUS, GROUP A SCREEN (DIRECT): NEGATIVE

## 2014-03-22 MED ORDER — SALINE SPRAY 0.65 % NA SOLN: 1.0000 | NASAL | Status: DC | PRN

## 2014-03-22 NOTE — Discharge Instructions (Signed)
Use nasal saline as prescribed along with salt water gargles. Stay well hydrated.  Salt Water Gargle This solution will help make your mouth and throat feel better. HOME CARE INSTRUCTIONS   Mix 1 teaspoon of salt in 8 ounces of warm water.  Gargle with this solution as much or often as you need or as directed. Swish and gargle gently if you have any sores or wounds in your mouth.  Do not swallow this mixture. Document Released: 11/14/2003 Document Revised: 05/04/2011 Document Reviewed: 04/06/2008 [facility] Patient Information 2015 [place], [place]. This information is not intended to replace advice given to you by your health care provider. Make sure you discuss any questions you have with your health care provider.  Pharyngitis Pharyngitis is redness, pain, and swelling (inflammation) of your pharynx.  CAUSES  Pharyngitis is usually caused by infection. Most of the time, these infections are from viruses (viral) and are part of a cold. However, sometimes pharyngitis is caused by bacteria (bacterial). Pharyngitis can also be caused by allergies. Viral pharyngitis may be spread from person to person by coughing, sneezing, and personal items or utensils (cups, forks, spoons, toothbrushes). Bacterial pharyngitis may be spread from person to person by more intimate contact, such as kissing.  SIGNS AND SYMPTOMS  Symptoms of pharyngitis include:   Sore throat.   Tiredness (fatigue).   Low-grade fever.   Headache.  Joint pain and muscle aches.  Skin rashes.  Swollen lymph nodes.  Plaque-like film on throat or tonsils (often seen with bacterial pharyngitis). DIAGNOSIS  Your health care provider will ask you questions about your illness and your symptoms. Your medical history, along with a physical exam, is often all that is needed to diagnose pharyngitis. Sometimes, a rapid strep test is done. Other lab tests may also be done, depending on the suspected cause.  TREATMENT  Viral  pharyngitis will usually get better in 3-4 days without the use of medicine. Bacterial pharyngitis is treated with medicines that kill germs (antibiotics).  HOME CARE INSTRUCTIONS   Drink enough water and fluids to keep your urine clear or pale yellow.   Only take over-the-counter or prescription medicines as directed by your health care provider:   If you are prescribed antibiotics, make sure you finish them even if you start to feel better.   Do not take aspirin.   Get lots of rest.   Gargle with 8 oz of salt water ( tsp of salt per 1 qt of water) as often as every 1-2 hours to soothe your throat.   Throat lozenges (if you are not at risk for choking) or sprays may be used to soothe your throat. SEEK MEDICAL CARE IF:   You have large, tender lumps in your neck.  You have a rash.  You cough up green, yellow-brown, or bloody spit. SEEK IMMEDIATE MEDICAL CARE IF:   Your neck becomes stiff.  You drool or are unable to swallow liquids.  You vomit or are unable to keep medicines or liquids down.  You have severe pain that does not go away with the use of recommended medicines.  You have trouble breathing (not caused by a stuffy nose). MAKE SURE YOU:   Understand these instructions.  Will watch your condition.  Will get help right away if you are not doing well or get worse. Document Released: 02/09/2005 Document Revised: 11/30/2012 Document Reviewed: 10/17/2012 [facility] Patient Information 2015 [place], [place]. This information is not intended to replace advice given to you by your health care  provider. Make sure you discuss any questions you have with your health care provider.  Upper Respiratory Infection, Adult An upper respiratory infection (URI) is also sometimes known as the common cold. The upper respiratory tract includes the nose, sinuses, throat, trachea, and bronchi. Bronchi are the airways leading to the lungs. Most people improve within 1 week, but  symptoms can last up to 2 weeks. A residual cough may last even longer.  CAUSES Many different viruses can infect the tissues lining the upper respiratory tract. The tissues become irritated and inflamed and often become very moist. Mucus production is also common. A cold is contagious. You can easily spread the virus to others by oral contact. This includes kissing, sharing a glass, coughing, or sneezing. Touching your mouth or nose and then touching a surface, which is then touched by another person, can also spread the virus. SYMPTOMS  Symptoms typically develop 1 to 3 days after you come in contact with a cold virus. Symptoms vary from person to person. They may include:  Runny nose.  Sneezing.  Nasal congestion.  Sinus irritation.  Sore throat.  Loss of voice (laryngitis).  Cough.  Fatigue.  Muscle aches.  Loss of appetite.  Headache.  Low-grade fever. DIAGNOSIS  You might diagnose your own cold based on familiar symptoms, since most people get a cold 2 to 3 times a year. Your caregiver can confirm this based on your exam. Most importantly, your caregiver can check that your symptoms are not due to another disease such as strep throat, sinusitis, pneumonia, asthma, or epiglottitis. Blood tests, throat tests, and X-rays are not necessary to diagnose a common cold, but they may sometimes be helpful in excluding other more serious diseases. Your caregiver will decide if any further tests are required. RISKS AND COMPLICATIONS  You may be at risk for a more severe case of the common cold if you smoke cigarettes, have chronic heart disease (such as heart failure) or lung disease (such as asthma), or if you have a weakened immune system. The very young and very old are also at risk for more serious infections. Bacterial sinusitis, middle ear infections, and bacterial pneumonia can complicate the common cold. The common cold can worsen asthma and chronic obstructive pulmonary disease  (COPD). Sometimes, these complications can require emergency medical care and may be life-threatening. PREVENTION  The best way to protect against getting a cold is to practice good hygiene. Avoid oral or hand contact with people with cold symptoms. Wash your hands often if contact occurs. There is no clear evidence that vitamin C, vitamin E, echinacea, or exercise reduces the chance of developing a cold. However, it is always recommended to get plenty of rest and practice good nutrition. TREATMENT  Treatment is directed at relieving symptoms. There is no cure. Antibiotics are not effective, because the infection is caused by a virus, not by bacteria. Treatment may include:  Increased fluid intake. Sports drinks offer valuable electrolytes, sugars, and fluids.  Breathing heated mist or steam (vaporizer or shower).  Eating chicken soup or other clear broths, and maintaining good nutrition.  Getting plenty of rest.  Using gargles or lozenges for comfort.  Controlling fevers with ibuprofen or acetaminophen as directed by your caregiver.  Increasing usage of your inhaler if you have asthma. Zinc gel and zinc lozenges, taken in the first 24 hours of the common cold, can shorten the duration and lessen the severity of symptoms. Pain medicines may help with fever, muscle aches, and throat  pain. A variety of non-prescription medicines are available to treat congestion and runny nose. Your caregiver can make recommendations and may suggest nasal or lung inhalers for other symptoms.  HOME CARE INSTRUCTIONS   Only take over-the-counter or prescription medicines for pain, discomfort, or fever as directed by your caregiver.  Use a warm mist humidifier or inhale steam from a shower to increase air moisture. This may keep secretions moist and make it easier to breathe.  Drink enough water and fluids to keep your urine clear or pale yellow.  Rest as needed.  Return to work when your temperature has  returned to normal or as your caregiver advises. You may need to stay home longer to avoid infecting others. You can also use a face mask and careful hand washing to prevent spread of the virus. SEEK MEDICAL CARE IF:   After the first few days, you feel you are getting worse rather than better.  You need your caregiver's advice about medicines to control symptoms.  You develop chills, worsening shortness of breath, or brown or red sputum. These may be signs of pneumonia.  You develop yellow or brown nasal discharge or pain in the face, especially when you bend forward. These may be signs of sinusitis.  You develop a fever, swollen neck glands, pain with swallowing, or white areas in the back of your throat. These may be signs of strep throat. SEEK IMMEDIATE MEDICAL CARE IF:   You have a fever.  You develop severe or persistent headache, ear pain, sinus pain, or chest pain.  You develop wheezing, a prolonged cough, cough up blood, or have a change in your usual mucus (if you have chronic lung disease).  You develop sore muscles or a stiff neck. Document Released: 08/05/2000 Document Revised: 05/04/2011 Document Reviewed: 05/17/2013 [facility] Patient Information 2015 [place], [place]. This information is not intended to replace advice given to you by your health care provider. Make sure you discuss any questions you have with your health care provider.

## 2014-03-22 NOTE — ED Notes (Signed)
Sore throat. Sore tongue. Symptoms x 1 week.

## 2014-03-22 NOTE — ED Provider Notes (Signed)
CSN: [id number]     Arrival date & time 03/22/14  1415 History   First MD Initiated Contact with Patient 03/22/14 1541     Chief Complaint  Patient presents with  . Sore Throat     (Consider location/radiation/quality/duration/timing/severity/associated sxs/prior Treatment) HPI Comments: 34 year old female presenting to the ED complaining of sore throat and sore tongue towards the back 1 week. Patient states her throat feels swollen, worse when swallowing. Denies any difficulty swallowing. Admits to associated productive cough with clear mucus. States she is congested. Mrs. subjective low-grade fevers. Denies nausea or vomiting. She states her boyfriend drank from the same cup as her and believes that is why she got sick. Her boyfriend is not sick. She has not tried any alleviating factors for her symptoms.  Patient is a 34 y.o. female presenting with pharyngitis. The history is provided by the patient.  Sore Throat Associated symptoms include chills, congestion, coughing, a fever and a sore throat.    Past Medical History  Diagnosis Date  . Seizures     last seizure 2 weeks ago  . Stroke   . Brain tumor (benign)   . PTSD (post-traumatic stress disorder)    Past Surgical History  Procedure Laterality Date  . Tubal ligation    . Tumor removal    . Leg surgery     No family history on file. History  Substance Use Topics  . Smoking status: Current Every Day Smoker -- 0.50 packs/day    Types: Cigarettes  . Smokeless tobacco: Never Used  . Alcohol Use: No   OB History    No data available     Review of Systems  Constitutional: Positive for fever and chills.  HENT: Positive for congestion and sore throat.   Respiratory: Positive for cough.   All other systems reviewed and are negative.     Allergies  Sulfa antibiotics and Vicodin  Home Medications   Prior to Admission medications   Medication Sig Start Date End Date Taking? Authorizing Provider  Atomoxetine HCl  (STRATTERA PO) Take by mouth.   Yes Historical Provider, MD  cephALEXin (KEFLEX) 500 MG capsule Take 1 capsule (500 mg total) by mouth 2 (two) times daily. 06/14/13   Lucila [place], PA-C  clonazePAM (KLONOPIN) 1 MG tablet Take 1 mg by mouth 2 (two) times daily as needed. For anxiety     Historical Provider, MD  dexmethylphenidate (FOCALIN XR) 15 MG 24 hr capsule Take 30 mg by mouth daily.    Historical Provider, MD  escitalopram (LEXAPRO) 20 MG tablet Take 20 mg by mouth daily.      Historical Provider, MD  gabapentin (NEURONTIN) 300 MG capsule Take 300 mg by mouth 3 (three) times daily.    Historical Provider, MD  hydrOXYzine (VISTARIL) 25 MG capsule Take 25 mg by mouth 2 (two) times daily.      Historical Provider, MD  levETIRAcetam (KEPPRA) 750 MG tablet Take 1,000 mg by mouth every 12 (twelve) hours.     Historical Provider, MD  omeprazole (PRILOSEC) 40 MG capsule Take 40 mg by mouth daily.    Historical Provider, MD  Sertraline HCl (ZOLOFT PO) Take by mouth.    Historical Provider, MD  sodium chloride (OCEAN) 0.65 % SOLN nasal spray Place 1 spray into both nostrils as needed for congestion. 03/22/14   Graylin Sperling M Pratham Cassatt, PA-C  topiramate (TOPAMAX) 50 MG tablet Take 50-100 mg by mouth 2 (two) times daily. Take 1 tab in the morning and 2  tabs in the evening     Historical Provider, MD   LMP 03/15/2014 Physical Exam  Constitutional: She is oriented to person, place, and time. She appears well-developed and well-nourished. No distress.  HENT:  Head: Normocephalic and atraumatic.  Post oropharyngeal erythema without edema or exudate. Nasal congestion, mucosal edema, post nasal drip.  Eyes: Conjunctivae and EOM are normal.  Neck: Normal range of motion. Neck supple.  Cardiovascular: Normal rate, regular rhythm and normal heart sounds.   Pulmonary/Chest: Effort normal and breath sounds normal. No respiratory distress.  Musculoskeletal: Normal range of motion. She exhibits no edema.   Lymphadenopathy:    She has no cervical adenopathy.  Neurological: She is alert and oriented to person, place, and time. No sensory deficit.  Skin: Skin is warm and dry.  Psychiatric: She has a normal mood and affect. Her behavior is normal.  Nursing note and vitals reviewed.   ED Course  Procedures (including critical care time) Labs Review Labs Reviewed  RAPID STREP SCREEN  CULTURE, GROUP A STREP    Imaging Review No results found.   EKG Interpretation None      MDM   Final diagnoses:  URI (upper respiratory infection)  Viral pharyngitis   Pt in NAD. Rapid strep negative. Lungs clear. Discussed symptomatic treatment- OTC decongestants, nasal saline, salt-water gargles. Stable for d/c. Return precautions given. Patient states understanding of treatment care plan and is agreeable.  Carman Ching, PA-C 03/22/14 [place], MD 03/22/14 [phone]

## 2014-03-24 LAB — CULTURE, GROUP A STREP

## 2014-09-05 ENCOUNTER — Encounter (HOSPITAL_COMMUNITY): Payer: Self-pay | Admitting: *Deleted

## 2014-09-05 ENCOUNTER — Emergency Department (HOSPITAL_COMMUNITY)
Admission: EM | Admit: 2014-09-05 | Discharge: 2014-09-05 | Disposition: A | Discharge status: Home / Self Care | Payer: Medicaid Other | Attending: Emergency Medicine | Admitting: Emergency Medicine

## 2014-09-05 DIAGNOSIS — X58XXXA Exposure to other specified factors, initial encounter: Secondary | ICD-10-CM | POA: Insufficient documentation

## 2014-09-05 DIAGNOSIS — F319 Bipolar disorder, unspecified: Secondary | ICD-10-CM | POA: Insufficient documentation

## 2014-09-05 DIAGNOSIS — Z792 Long term (current) use of antibiotics: Secondary | ICD-10-CM | POA: Diagnosis not present

## 2014-09-05 DIAGNOSIS — Y999 Unspecified external cause status: Secondary | ICD-10-CM | POA: Insufficient documentation

## 2014-09-05 DIAGNOSIS — Z720 Tobacco use: Secondary | ICD-10-CM | POA: Diagnosis not present

## 2014-09-05 DIAGNOSIS — S00512A Abrasion of oral cavity, initial encounter: Secondary | ICD-10-CM | POA: Diagnosis not present

## 2014-09-05 DIAGNOSIS — Z3202 Encounter for pregnancy test, result negative: Secondary | ICD-10-CM | POA: Diagnosis not present

## 2014-09-05 DIAGNOSIS — Y939 Activity, unspecified: Secondary | ICD-10-CM | POA: Insufficient documentation

## 2014-09-05 DIAGNOSIS — Y929 Unspecified place or not applicable: Secondary | ICD-10-CM | POA: Insufficient documentation

## 2014-09-05 DIAGNOSIS — Z8673 Personal history of transient ischemic attack (TIA), and cerebral infarction without residual deficits: Secondary | ICD-10-CM | POA: Diagnosis not present

## 2014-09-05 DIAGNOSIS — Z86011 Personal history of benign neoplasm of the brain: Secondary | ICD-10-CM | POA: Insufficient documentation

## 2014-09-05 DIAGNOSIS — G40909 Epilepsy, unspecified, not intractable, without status epilepticus: Secondary | ICD-10-CM | POA: Insufficient documentation

## 2014-09-05 DIAGNOSIS — R569 Unspecified convulsions: Secondary | ICD-10-CM

## 2014-09-05 DIAGNOSIS — Z79899 Other long term (current) drug therapy: Secondary | ICD-10-CM | POA: Insufficient documentation

## 2014-09-05 HISTORY — DX: Bipolar disorder, unspecified: F31.9

## 2014-09-05 HISTORY — DX: Personality disorder, unspecified: F60.9

## 2014-09-05 LAB — BASIC METABOLIC PANEL
Anion gap: 5 (ref 5–15)
BUN: 9 mg/dL (ref 6–20)
CO2: 20 mmol/L — AB (ref 22–32)
Calcium: 8.5 mg/dL — ABNORMAL LOW (ref 8.9–10.3)
Chloride: 112 mmol/L — ABNORMAL HIGH (ref 101–111)
Creatinine, Ser: 1 mg/dL (ref 0.44–1.00)
Glucose, Bld: 82 mg/dL (ref 65–99)
POTASSIUM: 3.7 mmol/L (ref 3.5–5.1)
Sodium: 137 mmol/L (ref 135–145)

## 2014-09-05 LAB — CBC WITH DIFFERENTIAL/PLATELET
Basophils Absolute: 0 10*3/uL (ref 0.0–0.1)
Basophils Relative: 0 % (ref 0–1)
EOS PCT: 0 % (ref 0–5)
Eosinophils Absolute: 0 10*3/uL (ref 0.0–0.7)
HEMATOCRIT: 33.6 % — AB (ref 36.0–46.0)
HEMOGLOBIN: 11.3 g/dL — AB (ref 12.0–15.0)
Lymphocytes Relative: 38 % (ref 12–46)
Lymphs Abs: 2.1 10*3/uL (ref 0.7–4.0)
MCH: 27.8 pg (ref 26.0–34.0)
MCHC: 33.6 g/dL (ref 30.0–36.0)
MCV: 82.8 fL (ref 78.0–100.0)
Monocytes Absolute: 0.4 10*3/uL (ref 0.1–1.0)
Monocytes Relative: 7 % (ref 3–12)
NEUTROS PCT: 55 % (ref 43–77)
Neutro Abs: 3 10*3/uL (ref 1.7–7.7)
PLATELETS: 195 10*3/uL (ref 150–400)
RBC: 4.06 MIL/uL (ref 3.87–5.11)
RDW: 16.2 % — ABNORMAL HIGH (ref 11.5–15.5)
WBC: 5.4 10*3/uL (ref 4.0–10.5)

## 2014-09-05 LAB — URINALYSIS, ROUTINE W REFLEX MICROSCOPIC
Bilirubin Urine: NEGATIVE
Glucose, UA: NEGATIVE mg/dL
HGB URINE DIPSTICK: NEGATIVE
KETONES UR: NEGATIVE mg/dL
LEUKOCYTES UA: NEGATIVE
NITRITE: NEGATIVE
PH: 7.5 (ref 5.0–8.0)
PROTEIN: NEGATIVE mg/dL
SPECIFIC GRAVITY, URINE: 1.011 (ref 1.005–1.030)
UROBILINOGEN UA: 1 mg/dL (ref 0.0–1.0)

## 2014-09-05 LAB — POC URINE PREG, ED: PREG TEST UR: NEGATIVE

## 2014-09-05 MED ORDER — SODIUM CHLORIDE 0.9 % IV SOLN
1000.0000 mg | Freq: Once | INTRAVENOUS | Status: AC
  Administered 2014-09-05: 1000 mg via INTRAVENOUS
  Filled 2014-09-05: qty 10

## 2014-09-05 MED ORDER — SODIUM CHLORIDE 0.9 % IV BOLUS (SEPSIS)
500.0000 mL | Freq: Once | INTRAVENOUS | Status: AC
  Administered 2014-09-05: 500 mL via INTRAVENOUS

## 2014-09-05 NOTE — ED Notes (Signed)
Patient resting in bed. BP decreased. Assisted patient to reposition to sitting position in bed. BP 108/71. Denies pain.

## 2014-09-05 NOTE — ED Provider Notes (Signed)
CSN: [id number]     Arrival date & time 09/05/14  1317 History   First MD Initiated Contact with Patient 09/05/14 1337     Chief Complaint  Patient presents with  . Seizures     (Consider location/radiation/quality/duration/timing/severity/associated sxs/prior Treatment) HPI Comments: Presents to the emergency department for evaluation of seizures. Patient does have a history of seizure disorder. She takes Keppra and Topamax for her seizures. She has not had any changes in her medications. [facility] Patient reports that she has been having frequent seizures over the past week. She has had significant social stressors and she believes this as a cause of her seizures. Her seizures are often caused by stress.  Patient reports that her seizures are generalized tonic-clonic in nature. Last seizure was last night. She did bite her tongue, has a small abrasion on the left side of her tongue.  Patient is a 34 y.o. female presenting with seizures.  Seizures   Past Medical History  Diagnosis Date  . Seizures     last seizure 2 weeks ago  . Stroke   . Brain tumor (benign)   . PTSD (post-traumatic stress disorder)   . Bipolar 1 disorder   . Personality disorder    Past Surgical History  Procedure Laterality Date  . Tubal ligation    . Tumor removal    . Leg surgery     History reviewed. No pertinent family history. History  Substance Use Topics  . Smoking status: Current Every Day Smoker -- 0.50 packs/day    Types: Cigarettes  . Smokeless tobacco: Never Used  . Alcohol Use: No   OB History    No data available     Review of Systems  Neurological: Positive for seizures.  All other systems reviewed and are negative.     Allergies  Sulfa antibiotics and Vicodin  Home Medications   Prior to Admission medications   Medication Sig Start Date End Date Taking? Authorizing Provider  atomoxetine (STRATTERA) 25 MG capsule Take 25 mg by  mouth daily.   Yes Historical Provider, MD  gabapentin (NEURONTIN) 300 MG capsule Take 300 mg by mouth 3 (three) times daily.   Yes Historical Provider, MD  levETIRAcetam (KEPPRA) 1000 MG tablet Take 1,000 mg by mouth 2 (two) times daily.   Yes Historical Provider, MD  sodium chloride (OCEAN) 0.65 % SOLN nasal spray Place 1 spray into both nostrils as needed for congestion. 03/22/14  Yes Robyn M Hess, PA-C  topiramate (TOPAMAX) 50 MG tablet Take 50 mg by mouth 3 (three) times daily. Take 1 tab in the morning and 2 tabs in the evening   Yes Historical Provider, MD  cephALEXin (KEFLEX) 500 MG capsule Take 1 capsule (500 mg total) by mouth 2 (two) times daily. Patient not taking: Reported on 09/05/2014 06/14/13   Lucila [place], PA-C   BP 111/68 mmHg  Pulse 71  Temp(Src) 98.2 F (36.8 C) (Oral)  Resp 13  Ht 5\' 6"  (1.676 m)  Wt 157 lb 1 oz (71.243 kg)  BMI 25.36 kg/m2  SpO2 100%  LMP 08/22/2014 (Approximate) Physical Exam  Constitutional: She is oriented to person, place, and time. She appears well-developed and well-nourished. No distress.  HENT:  Head: Normocephalic and atraumatic.  Right Ear: Hearing normal.  Left Ear: Hearing normal.  Nose: Nose normal.  Mouth/Throat: Oropharynx is clear and moist and mucous membranes are normal.    Eyes: Conjunctivae and EOM are normal. Pupils  are equal, round, and reactive to light.  Neck: Normal range of motion. Neck supple.  Cardiovascular: Regular rhythm, S1 normal and S2 normal.  Exam reveals no gallop and no friction rub.   No murmur heard. Pulmonary/Chest: Effort normal and breath sounds normal. No respiratory distress. She exhibits no tenderness.  Abdominal: Soft. Normal appearance and bowel sounds are normal. There is no hepatosplenomegaly. There is no tenderness. There is no rebound, no guarding, no tenderness at McBurney's point and negative Murphy's sign. No hernia.  Musculoskeletal: Normal range of motion.  Neurological: She is alert  and oriented to person, place, and time. She has normal strength. No cranial nerve deficit or sensory deficit. Coordination normal. GCS eye subscore is 4. GCS verbal subscore is 5. GCS motor subscore is 6.  Skin: Skin is warm, dry and intact. No rash noted. No cyanosis.  Psychiatric: She has a normal mood and affect. Her speech is normal and behavior is normal. Thought content normal.  Nursing note and vitals reviewed.   ED Course  Procedures (including critical care time) Labs Review Labs Reviewed  CBC WITH DIFFERENTIAL/PLATELET - Abnormal; Notable for the following:    Hemoglobin 11.3 (*)    HCT 33.6 (*)    RDW 16.2 (*)    All other components within normal limits  BASIC METABOLIC PANEL - Abnormal; Notable for the following:    Chloride 112 (*)    CO2 20 (*)    Calcium 8.5 (*)    All other components within normal limits  URINALYSIS, ROUTINE W REFLEX MICROSCOPIC (NOT AT [facility]) - Abnormal; Notable for the following:    APPearance HAZY (*)    All other components within normal limits  POC URINE PREG, ED    Imaging Review No results found.   EKG Interpretation None      MDM   Final diagnoses:  Seizure    Patient with a previous history of seizure disorder, treated with Keppra and Topamax, presents to the ER for increased seizure activity. Patient has been experiencing daily seizures for the last week. She has attributed this to increased stress. She does not think she has missed any medications. Neither Keppra or Topamax levels are easily obtained. Patient was given additional Keppra and IV fluids here in the ER. Her workup was otherwise unremarkable. Patient is to be discharged, follow-up with her neurologist tomorrow by phone.    Orpah Greek, MD 09/05/14 [id number]

## 2014-09-05 NOTE — ED Notes (Signed)
Removed 20 gauge catheter from L antecubital. Catheter intact. Patient tolerated well.

## 2014-09-05 NOTE — Discharge Instructions (Signed)
Seizure, Adult A seizure is abnormal electrical activity in the brain. Seizures usually last from 30 seconds to 2 minutes. There are various types of seizures. Before a seizure, you may have a warning sensation (aura) that a seizure is about to occur. An aura may include the following symptoms:   Fear or anxiety.  Nausea.  Feeling like the room is spinning (vertigo).  Vision changes, such as seeing flashing lights or spots. Common symptoms during a seizure include:  A change in attention or behavior (altered mental status).  Convulsions with rhythmic jerking movements.  Drooling.  Rapid eye movements.  Grunting.  Loss of bladder and bowel control.  Bitter taste in the mouth.  Tongue biting. After a seizure, you may feel confused and sleepy. You may also have an injury resulting from convulsions during the seizure. HOME CARE INSTRUCTIONS   If you are given medicines, take them exactly as prescribed by your health care provider.  Keep all follow-up appointments as directed by your health care provider.  Do not swim or drive or engage in risky activity during which a seizure could cause further injury to you or others until your health care provider says it is OK.  Get adequate rest.  Teach friends and family what to do if you have a seizure. They should:  Lay you on the ground to prevent a fall.  Put a cushion under your head.  Loosen any tight clothing around your neck.  Turn you on your side. If vomiting occurs, this helps keep your airway clear.  Stay with you until you recover.  Know whether or not you need emergency care. SEEK IMMEDIATE MEDICAL CARE IF:  The seizure lasts longer than 5 minutes.  The seizure is severe or you do not wake up immediately after the seizure.  You have an altered mental status after the seizure.  You are having more frequent or worsening seizures. Someone should drive you to the emergency department or call local emergency  services (911 in [place] MAKE SURE YOU:  Understand these instructions.  Will watch your condition.  Will get help right away if you are not doing well or get worse. Document Released: 02/07/2000 Document Revised: 11/30/2012 Document Reviewed: 09/21/2012 [facility] Patient Information 2015 [place], [place]. This information is not intended to replace advice given to you by your health care provider. Make sure you discuss any questions you have with your health care provider.  Epilepsy Epilepsy is a disorder in which a person has repeated seizures over time. A seizure is a release of abnormal electrical activity in the brain. Seizures can cause a change in attention, behavior, or the ability to remain awake and alert (altered mental status). Seizures often involve uncontrollable shaking (convulsions).  Most people with epilepsy lead normal lives. However, people with epilepsy are at an increased risk of falls, accidents, and injuries. Therefore, it is important to begin treatment right away. CAUSES  Epilepsy has many possible causes. Anything that disturbs the normal pattern of brain cell activity can lead to seizures. This may include:   Head injury.  Birth trauma.  High fever as a child.  Stroke.  Bleeding into or around the brain.  Certain drugs.  Prolonged low oxygen, such as what occurs after CPR efforts.  Abnormal brain development.  Certain illnesses, such as meningitis, encephalitis (brain infection), malaria, and other infections.  An imbalance of nerve signaling chemicals (neurotransmitters).  SIGNS AND SYMPTOMS  The symptoms of a seizure can vary greatly from one person  to another. Right before a seizure, you may have a warning (aura) that a seizure is about to occur. An aura may include the following symptoms:  Fear or anxiety.  Nausea.  Feeling like the room is spinning (vertigo).  Vision changes, such as seeing flashing lights or spots. Common symptoms during a  seizure include:  Abnormal sensations, such as an abnormal smell or a bitter taste in the mouth.   Sudden, general body stiffness.   Convulsions that involve rhythmic jerking of the face, arm, or leg on one or both sides.   Sudden change in consciousness.   Appearing to be awake but not responding.   Appearing to be asleep but cannot be awakened.   Grimacing, chewing, lip smacking, drooling, tongue biting, or loss of bowel or bladder control. After a seizure, you may feel sleepy for a while. DIAGNOSIS  Your health care provider will ask about your symptoms and take a medical history. Descriptions from any witnesses to your seizures will be very helpful in the diagnosis. A physical exam, including a detailed neurological exam, is necessary. Various tests may be done, such as:   An electroencephalogram (EEG). This is a painless test of your brain waves. In this test, a diagram is created of your brain waves. These diagrams can be interpreted by a specialist.  An MRI of the brain.   A CT scan of the brain.   A spinal tap (lumbar puncture, LP).  Blood tests to check for signs of infection or abnormal blood chemistry. TREATMENT  There is no cure for epilepsy, but it is generally treatable. Once epilepsy is diagnosed, it is important to begin treatment as soon as possible. For most people with epilepsy, seizures can be controlled with medicines. The following may also be used:  A pacemaker for the brain (vagus nerve stimulator) can be used for people with seizures that are not well controlled by medicine.  Surgery on the brain. For some people, epilepsy eventually goes away. HOME CARE INSTRUCTIONS   Follow your health care provider's recommendations on driving and safety in normal activities.  Get enough rest. Lack of sleep can cause seizures.  Only take over-the-counter or prescription medicines as directed by your health care provider. Take any prescribed medicine  exactly as directed.  Avoid any known triggers of your seizures.  Keep a seizure diary. Record what you recall about any seizure, especially any possible trigger.   Make sure the people you live and work with know that you are prone to seizures. They should receive instructions on how to help you. In general, a witness to a seizure should:   Cushion your head and body.   Turn you on your side.   Avoid unnecessarily restraining you.   Not place anything inside your mouth.   Call for emergency medical help if there is any question about what has occurred.   Follow up with your health care provider as directed. You may need regular blood tests to monitor the levels of your medicine.  SEEK MEDICAL CARE IF:   You develop signs of infection or other illness. This might increase the risk of a seizure.   You seem to be having more frequent seizures.   Your seizure pattern is changing.  SEEK IMMEDIATE MEDICAL CARE IF:   You have a seizure that does not stop after a few moments.   You have a seizure that causes any difficulty in breathing.   You have a seizure that results in a  very severe headache.   You have a seizure that leaves you with the inability to speak or use a part of your body.  Document Released: 02/09/2005 Document Revised: 11/30/2012 Document Reviewed: 09/21/2012 [facility] Patient Information 2015 [place], [place]. This information is not intended to replace advice given to you by your health care provider. Make sure you discuss any questions you have with your health care provider.

## 2014-09-05 NOTE — ED Notes (Signed)
MD Pollina aware of BP.

## 2014-09-05 NOTE — ED Notes (Signed)
Pt states she has been having seizures for 7 years. She has been having seizures every day for the last week. Her seizures are brought on by stress and she is having more stress this week. She is taking her meds. She takes keppa 1000mg  bid and topomax 50 mg tid and gabipenten 300mg  tid. No injury, she has had a fever for one day. No meds for fever taken today. Last seizure was last night.

## 2014-09-26 ENCOUNTER — Encounter (HOSPITAL_BASED_OUTPATIENT_CLINIC_OR_DEPARTMENT_OTHER): Payer: Self-pay

## 2014-09-26 ENCOUNTER — Emergency Department (HOSPITAL_BASED_OUTPATIENT_CLINIC_OR_DEPARTMENT_OTHER)
Admission: EM | Admit: 2014-09-26 | Discharge: 2014-09-26 | Disposition: A | Discharge status: Home / Self Care | Payer: Medicaid Other | Attending: Emergency Medicine | Admitting: Emergency Medicine

## 2014-09-26 ENCOUNTER — Emergency Department (HOSPITAL_BASED_OUTPATIENT_CLINIC_OR_DEPARTMENT_OTHER): Payer: Medicaid Other

## 2014-09-26 DIAGNOSIS — Z3202 Encounter for pregnancy test, result negative: Secondary | ICD-10-CM | POA: Insufficient documentation

## 2014-09-26 DIAGNOSIS — Y998 Other external cause status: Secondary | ICD-10-CM | POA: Diagnosis not present

## 2014-09-26 DIAGNOSIS — Z720 Tobacco use: Secondary | ICD-10-CM | POA: Diagnosis not present

## 2014-09-26 DIAGNOSIS — B9689 Other specified bacterial agents as the cause of diseases classified elsewhere: Secondary | ICD-10-CM

## 2014-09-26 DIAGNOSIS — X58XXXA Exposure to other specified factors, initial encounter: Secondary | ICD-10-CM | POA: Insufficient documentation

## 2014-09-26 DIAGNOSIS — N72 Inflammatory disease of cervix uteri: Secondary | ICD-10-CM | POA: Insufficient documentation

## 2014-09-26 DIAGNOSIS — N760 Acute vaginitis: Secondary | ICD-10-CM | POA: Diagnosis not present

## 2014-09-26 DIAGNOSIS — Z79899 Other long term (current) drug therapy: Secondary | ICD-10-CM | POA: Diagnosis not present

## 2014-09-26 DIAGNOSIS — Y9289 Other specified places as the place of occurrence of the external cause: Secondary | ICD-10-CM | POA: Diagnosis not present

## 2014-09-26 DIAGNOSIS — Z8673 Personal history of transient ischemic attack (TIA), and cerebral infarction without residual deficits: Secondary | ICD-10-CM | POA: Insufficient documentation

## 2014-09-26 DIAGNOSIS — G40909 Epilepsy, unspecified, not intractable, without status epilepticus: Secondary | ICD-10-CM | POA: Insufficient documentation

## 2014-09-26 DIAGNOSIS — F319 Bipolar disorder, unspecified: Secondary | ICD-10-CM | POA: Diagnosis not present

## 2014-09-26 DIAGNOSIS — R197 Diarrhea, unspecified: Secondary | ICD-10-CM | POA: Diagnosis not present

## 2014-09-26 DIAGNOSIS — Z9851 Tubal ligation status: Secondary | ICD-10-CM | POA: Insufficient documentation

## 2014-09-26 DIAGNOSIS — S9031XA Contusion of right foot, initial encounter: Secondary | ICD-10-CM | POA: Diagnosis not present

## 2014-09-26 DIAGNOSIS — Y9389 Activity, other specified: Secondary | ICD-10-CM | POA: Diagnosis not present

## 2014-09-26 DIAGNOSIS — R1030 Lower abdominal pain, unspecified: Secondary | ICD-10-CM | POA: Diagnosis present

## 2014-09-26 DIAGNOSIS — Z86011 Personal history of benign neoplasm of the brain: Secondary | ICD-10-CM | POA: Insufficient documentation

## 2014-09-26 DIAGNOSIS — F4310 Post-traumatic stress disorder, unspecified: Secondary | ICD-10-CM | POA: Insufficient documentation

## 2014-09-26 LAB — URINALYSIS, ROUTINE W REFLEX MICROSCOPIC
Bilirubin Urine: NEGATIVE
GLUCOSE, UA: NEGATIVE mg/dL
HGB URINE DIPSTICK: NEGATIVE
Ketones, ur: NEGATIVE mg/dL
Nitrite: NEGATIVE
Protein, ur: NEGATIVE mg/dL
Specific Gravity, Urine: 1.02 (ref 1.005–1.030)
UROBILINOGEN UA: 1 mg/dL (ref 0.0–1.0)
pH: 6 (ref 5.0–8.0)

## 2014-09-26 LAB — WET PREP, GENITAL
TRICH WET PREP: NONE SEEN
YEAST WET PREP: NONE SEEN

## 2014-09-26 LAB — URINE MICROSCOPIC-ADD ON

## 2014-09-26 LAB — PREGNANCY, URINE: Preg Test, Ur: NEGATIVE

## 2014-09-26 MED ORDER — CEFTRIAXONE SODIUM 250 MG IJ SOLR
250.0000 mg | Freq: Once | INTRAMUSCULAR | Status: AC
  Administered 2014-09-26: 250 mg via INTRAMUSCULAR
  Filled 2014-09-26: qty 250

## 2014-09-26 MED ORDER — DOXYCYCLINE HYCLATE 100 MG PO CAPS: 100.0000 mg | ORAL_CAPSULE | Freq: Two times a day (BID) | ORAL | Status: DC

## 2014-09-26 MED ORDER — METRONIDAZOLE 500 MG PO TABS
500.0000 mg | ORAL_TABLET | Freq: Two times a day (BID) | ORAL | Status: DC
Start: 2014-09-26 — End: 2014-12-14

## 2014-09-26 MED ORDER — AZITHROMYCIN 250 MG PO TABS
1000.0000 mg | ORAL_TABLET | Freq: Once | ORAL | Status: AC
  Administered 2014-09-26: 1000 mg via ORAL
  Filled 2014-09-26: qty 4

## 2014-09-26 MED ORDER — IBUPROFEN 800 MG PO TABS
800.0000 mg | ORAL_TABLET | Freq: Once | ORAL | Status: AC
  Administered 2014-09-26: 800 mg via ORAL
  Filled 2014-09-26: qty 1

## 2014-09-26 NOTE — ED Provider Notes (Signed)
CSN: [id number]     Arrival date & time 09/26/14  1218 History   First MD Initiated Contact with Patient 09/26/14 1219     Chief Complaint  Patient presents with  . Abdominal Pain     (Consider location/radiation/quality/duration/timing/severity/associated sxs/prior Treatment) HPI Comments: 34 year old female complaining of lower abdominal pain 1 week. Pain described as sharp and cramping, constant, radiating across her lower abdomen rated 9/10. She came off her menstrual cycle 3 days ago and states it lasted 3 days longer than normal, and when her menstrual cycle ended, her pain increased. Admits to associated nausea with one episode of vomiting 2 days ago. Also reports nonbloody diarrhea 2 days ago. Once her menstrual cycle ended she began having yellow/green, thick vaginal discharge with an odor. She is sexually active with one female partner, but states he has gone for months at a time and believes he is sleeping with other women. She is concerned she has a sexually transmitted disease. Denies fevers. Also complaining of right foot pain beginning today after hitting her foot on the bed. States there is a bruise. Pain increased with walking. No alleviating factors.  Patient is a 34 y.o. female presenting with abdominal pain. The history is provided by the patient.  Abdominal Pain Associated symptoms: diarrhea (2 days ago), nausea, vaginal discharge and vomiting (2 days ago)     Past Medical History  Diagnosis Date  . Seizures     last seizure 2 weeks ago  . Stroke   . Brain tumor (benign)   . PTSD (post-traumatic stress disorder)   . Bipolar 1 disorder   . Personality disorder    Past Surgical History  Procedure Laterality Date  . Tubal ligation    . Tumor removal    . Leg surgery     No family history on file. History  Substance Use Topics  . Smoking status: Current Every Day Smoker -- 0.50 packs/day    Types: Cigarettes  . Smokeless tobacco: Never Used  . Alcohol Use: No    OB History    No data available     Review of Systems  Gastrointestinal: Positive for nausea, vomiting (2 days ago), abdominal pain and diarrhea (2 days ago).  Genitourinary: Positive for vaginal discharge.  Musculoskeletal:       + R foot pain.  Skin: Positive for color change (bruise on foot).  All other systems reviewed and are negative.     Allergies  Sulfa antibiotics and Vicodin  Home Medications   Prior to Admission medications   Medication Sig Start Date End Date Taking? Authorizing Provider  METHADONE HCL PO Take by mouth.   Yes Historical Provider, MD  Sertraline HCl (ZOLOFT PO) Take by mouth.   Yes Historical Provider, MD  doxycycline (VIBRAMYCIN) 100 MG capsule Take 1 capsule (100 mg total) by mouth 2 (two) times daily. One po bid x 14 days 09/26/14   Carman Ching, PA-C  gabapentin (NEURONTIN) 300 MG capsule Take 300 mg by mouth 3 (three) times daily.    Historical Provider, MD  levETIRAcetam (KEPPRA) 1000 MG tablet Take 1,000 mg by mouth 2 (two) times daily.    Historical Provider, MD  metroNIDAZOLE (FLAGYL) 500 MG tablet Take 1 tablet (500 mg total) by mouth 2 (two) times daily. One po bid x 7 days 09/26/14   Carman Ching, PA-C  topiramate (TOPAMAX) 50 MG tablet Take 50 mg by mouth 3 (three) times daily. Take 1 tab in the morning and 2  tabs in the evening    Historical Provider, MD   BP 107/73 mmHg  Pulse 77  Temp(Src) 98.2 F (36.8 C) (Oral)  Resp 16  Ht 5\' 6"  (1.676 m)  Wt 147 lb (66.679 kg)  BMI 23.74 kg/m2  SpO2 100%  LMP 09/22/2014 Physical Exam  Constitutional: She is oriented to person, place, and time. She appears well-developed and well-nourished. No distress.  HENT:  Head: Normocephalic and atraumatic.  Mouth/Throat: Oropharynx is clear and moist.  Eyes: Conjunctivae and EOM are normal.  Neck: Normal range of motion. Neck supple.  Cardiovascular: Normal rate, regular rhythm and normal heart sounds.   Pulmonary/Chest: Effort normal and breath  sounds normal. No respiratory distress.  Abdominal: Normal appearance. She exhibits no distension. There is tenderness in the suprapubic area. There is no rigidity, no rebound, no guarding and no CVA tenderness.  No tenderness noted with distraction. No peritoneal signs.  Genitourinary: Uterus normal. Cervix exhibits discharge. Cervix exhibits no motion tenderness. No erythema, tenderness or bleeding in the vagina. Vaginal discharge (white/yellow) found.  BL adnexal tenderness, no tenderness with distraction.  Musculoskeletal: Normal range of motion. She exhibits no edema.  Neurological: She is alert and oriented to person, place, and time. No sensory deficit.  Skin: Skin is warm and dry.  Psychiatric: She has a normal mood and affect. Her behavior is normal.  Nursing note and vitals reviewed.   ED Course  Procedures (including critical care time) Labs Review Labs Reviewed  WET PREP, GENITAL - Abnormal; Notable for the following:    Clue Cells Wet Prep HPF POC FEW (*)    WBC, Wet Prep HPF POC TOO NUMEROUS TO COUNT (*)    All other components within normal limits  URINALYSIS, ROUTINE W REFLEX MICROSCOPIC (NOT AT [facility]) - Abnormal; Notable for the following:    Leukocytes, UA SMALL (*)    All other components within normal limits  URINE MICROSCOPIC-ADD ON - Abnormal; Notable for the following:    Squamous Epithelial / LPF FEW (*)    Bacteria, UA FEW (*)    All other components within normal limits  PREGNANCY, URINE  GC/CHLAMYDIA PROBE AMP ([place]) NOT AT [facility]    Imaging Review Dg Foot Complete Right  09/26/2014   CLINICAL DATA:  Dropped chair on the right foot. Pain at the base of the third toe.  EXAM: RIGHT FOOT COMPLETE - 3+ VIEW  COMPARISON:  None.  FINDINGS: There is no evidence of fracture or dislocation. There is no evidence of arthropathy or other focal bone abnormality. Soft tissues are unremarkable.  IMPRESSION: Negative.   Electronically Signed   By: Nelson Chimes M.D.    On: 09/26/2014 13:33     EKG Interpretation None      MDM   Final diagnoses:  Cervicitis  Foot contusion, right, initial encounter  BV (bacterial vaginosis)   Non-toxic appearing, NAD. AFVSS. Abdomen soft with no peritoneal signs. No tenderness with distraction. No tenderness of McBurney's point or RUQ. Doubt appendicitis/cholecystitis. Upreg negative. Unlikely ectopic. Large amount of vaginal discharge on exam, wet prep sig for TNTC WBC. Cervicitis treated with rocephin/azithro in ED, will d/c home with doxy/flagyl (few clue cells). No CMT. Doubt TOA. Safe sexual practices discussed. No sexual intercourse for 10 days after completing antibiotics. Regarding foot pain, x-ray negative. Advised rice and NSAIDs. Stable for discharge. Return precautions given. Patient states understanding of treatment care plan and is agreeable.  Carman Ching, PA-C 09/26/14 [place], MD  09/26/14 1427 

## 2014-09-26 NOTE — ED Notes (Addendum)
C/o abd pain x 1 week-also c/o injury to right foot today-also c/o HA x 1-2 months

## 2014-09-26 NOTE — ED Notes (Signed)
MD at bedside. 

## 2014-09-26 NOTE — Discharge Instructions (Signed)
Take both antibiotics as directed to completion. No sexual intercourse for 10 days after completing the antibiotics. Rest, ice and elevate your foot. You may take ibuprofen, Tylenol or naproxen for pain.  Cervicitis Cervicitis is a soreness and swelling (inflammation) of the cervix. Your cervix is located at the bottom of your uterus. It opens up to the vagina. CAUSES   Sexually transmitted infections (STIs).   Allergic reaction.   Medicines or birth control devices that are put in the vagina.   Injury to the cervix.   Bacterial infections.  RISK FACTORS You are at greater risk if you:  Have unprotected sexual intercourse.  Have sexual intercourse with many partners.  Began sexual intercourse at an early age.  Have a history of STIs. SYMPTOMS  There may be no symptoms. If symptoms occur, they may include:   Gray, white, yellow, or bad-smelling vaginal discharge.   Pain or itching of the area outside the vagina.   Painful sexual intercourse.   Lower abdominal or lower back pain, especially during intercourse.   Frequent urination.   Abnormal vaginal bleeding between periods, after sexual intercourse, or after menopause.   Pressure or a heavy feeling in the pelvis.  DIAGNOSIS  Diagnosis is made after a pelvic exam. Other tests may include:   Examination of any discharge under a microscope (wet prep).   A Pap test.  TREATMENT  Treatment will depend on the cause of cervicitis. If it is caused by an STI, both you and your partner will need to be treated. Antibiotic medicines will be given.  HOME CARE INSTRUCTIONS   Do not have sexual intercourse until your health care provider says it is okay.   Do not have sexual intercourse until your partner has been treated, if your cervicitis is caused by an STI.   Take your antibiotics as directed. Finish them even if you start to feel better.  SEEK MEDICAL CARE IF:  Your symptoms come back.   You have a  fever.  MAKE SURE YOU:   Understand these instructions.  Will watch your condition.  Will get help right away if you are not doing well or get worse. Document Released: 02/09/2005 Document Revised: 02/14/2013 Document Reviewed: 08/03/2012 [facility] Patient Information 2015 [place], [place]. This information is not intended to replace advice given to you by your health care provider. Make sure you discuss any questions you have with your health care provider.  Bacterial Vaginosis Bacterial vaginosis is a vaginal infection that occurs when the normal balance of bacteria in the vagina is disrupted. It results from an overgrowth of certain bacteria. This is the most common vaginal infection in women of childbearing age. Treatment is important to prevent complications, especially in pregnant women, as it can cause a premature delivery. CAUSES  Bacterial vaginosis is caused by an increase in harmful bacteria that are normally present in smaller amounts in the vagina. Several different kinds of bacteria can cause bacterial vaginosis. However, the reason that the condition develops is not fully understood. RISK FACTORS Certain activities or behaviors can put you at an increased risk of developing bacterial vaginosis, including:  Having a new sex partner or multiple sex partners.  Douching.  Using an intrauterine device (IUD) for contraception. Women do not get bacterial vaginosis from toilet seats, bedding, swimming pools, or contact with objects around them. SIGNS AND SYMPTOMS  Some women with bacterial vaginosis have no signs or symptoms. Common symptoms include:  Grey vaginal discharge.  A fishlike odor with discharge,  especially after sexual intercourse.  Itching or burning of the vagina and vulva.  Burning or pain with urination. DIAGNOSIS  Your health care provider will take a medical history and examine the vagina for signs of bacterial vaginosis. A sample of vaginal fluid may be  taken. Your health care provider will look at this sample under a microscope to check for bacteria and abnormal cells. A vaginal pH test may also be done.  TREATMENT  Bacterial vaginosis may be treated with antibiotic medicines. These may be given in the form of a pill or a vaginal cream. A second round of antibiotics may be prescribed if the condition comes back after treatment.  HOME CARE INSTRUCTIONS   Only take over-the-counter or prescription medicines as directed by your health care provider.  If antibiotic medicine was prescribed, take it as directed. Make sure you finish it even if you start to feel better.  Do not have sex until treatment is completed.  Tell all sexual partners that you have a vaginal infection. They should see their health care provider and be treated if they have problems, such as a mild rash or itching.  Practice safe sex by using condoms and only having one sex partner. SEEK MEDICAL CARE IF:   Your symptoms are not improving after 3 days of treatment.  You have increased discharge or pain.  You have a fever. MAKE SURE YOU:   Understand these instructions.  Will watch your condition.  Will get help right away if you are not doing well or get worse. FOR MORE INFORMATION  Centers for Disease Control and Prevention, Division of STD Prevention: [employer] American Sexual Health Association (ASHA): www.ashastd.org  Document Released: 02/09/2005 Document Revised: 11/30/2012 Document Reviewed: 09/21/2012 [facility] Patient Information 2015 [place], [place]. This information is not intended to replace advice given to you by your health care provider. Make sure you discuss any questions you have with your health care provider.  Foot Contusion A foot contusion is a deep bruise to the foot. Contusions are the result of an injury that caused bleeding under the skin. The contusion may turn blue, purple, or yellow. Minor injuries will give you a painless contusion,  but more severe contusions may stay painful and swollen for a few weeks. CAUSES  A foot contusion comes from a direct blow to that area, such as a heavy object falling on the foot. SYMPTOMS   Swelling of the foot.  Discoloration of the foot.  Tenderness or soreness of the foot. DIAGNOSIS  You will have a physical exam and will be asked about your history. You may need an X-ray of your foot to look for a broken bone (fracture).  TREATMENT  An elastic wrap may be recommended to support your foot. Resting, elevating, and applying cold compresses to your foot are often the best treatments for a foot contusion. Over-the-counter medicines may also be recommended for pain control. HOME CARE INSTRUCTIONS   Put ice on the injured area.  Put ice in a plastic bag.  Place a towel between your skin and the bag.  Leave the ice on for 15-20 minutes, 03-04 times a day.  Only take over-the-counter or prescription medicines for pain, discomfort, or fever as directed by your caregiver.  If told, use an elastic wrap as directed. This can help reduce swelling. You may remove the wrap for sleeping, showering, and bathing. If your toes become numb, cold, or blue, take the wrap off and reapply it more loosely.  Elevate your  foot with pillows to reduce swelling.  Try to avoid standing or walking while the foot is painful. Do not resume use until instructed by your caregiver. Then, begin use gradually. If pain develops, decrease use. Gradually increase activities that do not cause discomfort until you have normal use of your foot.  See your caregiver as directed. It is very important to keep all follow-up appointments in order to avoid any lasting problems with your foot, including long-term (chronic) pain. SEEK IMMEDIATE MEDICAL CARE IF:   You have increased redness, swelling, or pain in your foot.  Your swelling or pain is not relieved with medicines.  You have loss of feeling in your foot or are  unable to move your toes.  Your foot turns cold or blue.  You have pain when you move your toes.  Your foot becomes warm to the touch.  Your contusion does not improve in 2 days. MAKE SURE YOU:   Understand these instructions.  Will watch your condition.  Will get help right away if you are not doing well or get worse. Document Released: 12/01/2005 Document Revised: 08/11/2011 Document Reviewed: 01/13/2011 [facility] Patient Information 2015 [place], [place]. This information is not intended to replace advice given to you by your health care provider. Make sure you discuss any questions you have with your health care provider.

## 2014-09-27 LAB — GC/CHLAMYDIA PROBE AMP (~~LOC~~) NOT AT ARMC
Chlamydia: NEGATIVE
NEISSERIA GONORRHEA: NEGATIVE

## 2014-12-14 ENCOUNTER — Encounter (HOSPITAL_COMMUNITY): Payer: Self-pay | Admitting: Cardiology

## 2014-12-14 ENCOUNTER — Emergency Department (HOSPITAL_COMMUNITY)
Admission: EM | Admit: 2014-12-14 | Discharge: 2014-12-14 | Disposition: A | Discharge status: Home / Self Care | Payer: Medicaid Other | Attending: Emergency Medicine | Admitting: Emergency Medicine

## 2014-12-14 DIAGNOSIS — F4310 Post-traumatic stress disorder, unspecified: Secondary | ICD-10-CM | POA: Diagnosis not present

## 2014-12-14 DIAGNOSIS — Z3202 Encounter for pregnancy test, result negative: Secondary | ICD-10-CM | POA: Insufficient documentation

## 2014-12-14 DIAGNOSIS — Z8673 Personal history of transient ischemic attack (TIA), and cerebral infarction without residual deficits: Secondary | ICD-10-CM | POA: Diagnosis not present

## 2014-12-14 DIAGNOSIS — G40909 Epilepsy, unspecified, not intractable, without status epilepticus: Secondary | ICD-10-CM | POA: Diagnosis not present

## 2014-12-14 DIAGNOSIS — Z9851 Tubal ligation status: Secondary | ICD-10-CM | POA: Diagnosis not present

## 2014-12-14 DIAGNOSIS — Z79899 Other long term (current) drug therapy: Secondary | ICD-10-CM | POA: Insufficient documentation

## 2014-12-14 DIAGNOSIS — F1310 Sedative, hypnotic or anxiolytic abuse, uncomplicated: Secondary | ICD-10-CM | POA: Insufficient documentation

## 2014-12-14 DIAGNOSIS — F315 Bipolar disorder, current episode depressed, severe, with psychotic features: Secondary | ICD-10-CM | POA: Diagnosis not present

## 2014-12-14 DIAGNOSIS — Z720 Tobacco use: Secondary | ICD-10-CM | POA: Diagnosis not present

## 2014-12-14 DIAGNOSIS — R1084 Generalized abdominal pain: Secondary | ICD-10-CM | POA: Diagnosis not present

## 2014-12-14 DIAGNOSIS — R109 Unspecified abdominal pain: Secondary | ICD-10-CM

## 2014-12-14 DIAGNOSIS — Z86011 Personal history of benign neoplasm of the brain: Secondary | ICD-10-CM | POA: Insufficient documentation

## 2014-12-14 LAB — RAPID URINE DRUG SCREEN, HOSP PERFORMED
Amphetamines: NOT DETECTED
Barbiturates: NOT DETECTED
Benzodiazepines: POSITIVE — AB
COCAINE: NOT DETECTED
OPIATES: NOT DETECTED
Tetrahydrocannabinol: NOT DETECTED

## 2014-12-14 LAB — COMPREHENSIVE METABOLIC PANEL
ALT: 16 U/L (ref 14–54)
ANION GAP: 7 (ref 5–15)
AST: 20 U/L (ref 15–41)
Albumin: 3.5 g/dL (ref 3.5–5.0)
Alkaline Phosphatase: 58 U/L (ref 38–126)
BUN: 12 mg/dL (ref 6–20)
CO2: 21 mmol/L — AB (ref 22–32)
Calcium: 8.9 mg/dL (ref 8.9–10.3)
Chloride: 113 mmol/L — ABNORMAL HIGH (ref 101–111)
Creatinine, Ser: 1.07 mg/dL — ABNORMAL HIGH (ref 0.44–1.00)
GFR calc Af Amer: >60 mL/min (ref >60)
GFR calc non Af Amer: >60 mL/min (ref >60)
GLUCOSE: 99 mg/dL (ref 65–99)
POTASSIUM: 3.9 mmol/L (ref 3.5–5.1)
SODIUM: 141 mmol/L (ref 135–145)
Total Bilirubin: 0.4 mg/dL (ref 0.3–1.2)
Total Protein: 6 g/dL — ABNORMAL LOW (ref 6.5–8.1)

## 2014-12-14 LAB — CBC
HCT: 36.6 % (ref 36.0–46.0)
HEMOGLOBIN: 12.2 g/dL (ref 12.0–15.0)
MCH: 28.8 pg (ref 26.0–34.0)
MCHC: 33.3 g/dL (ref 30.0–36.0)
MCV: 86.5 fL (ref 78.0–100.0)
Platelets: 194 10*3/uL (ref 150–400)
RBC: 4.23 MIL/uL (ref 3.87–5.11)
RDW: 13.9 % (ref 11.5–15.5)
WBC: 4.9 10*3/uL (ref 4.0–10.5)

## 2014-12-14 LAB — URINALYSIS, ROUTINE W REFLEX MICROSCOPIC
GLUCOSE, UA: NEGATIVE mg/dL
HGB URINE DIPSTICK: NEGATIVE
Ketones, ur: NEGATIVE mg/dL
LEUKOCYTES UA: NEGATIVE
Nitrite: NEGATIVE
Protein, ur: NEGATIVE mg/dL
Specific Gravity, Urine: 1.034 — ABNORMAL HIGH (ref 1.005–1.030)
Urobilinogen, UA: 1 mg/dL (ref 0.0–1.0)
pH: 5.5 (ref 5.0–8.0)

## 2014-12-14 LAB — I-STAT BETA HCG BLOOD, ED (MC, WL, AP ONLY): I-stat hCG, quantitative: <5 m[IU]/mL (ref <5)

## 2014-12-14 LAB — ETHANOL

## 2014-12-14 LAB — LIPASE, BLOOD: Lipase: 38 U/L (ref 11–51)

## 2014-12-14 NOTE — Discharge Instructions (Signed)
Pregnancy test was negative. Tylenol for pain. Try to get a primary care doctor. Resource guide given.   Emergency Department Resource Guide 1) Find a Doctor and Pay Out of Pocket Although you won't have to find out who is covered by your insurance plan, it is a good idea to ask around and get recommendations. You will then need to call the office and see if the doctor you have chosen will accept you as a new patient and what types of options they offer for patients who are self-pay. Some doctors offer discounts or will set up payment plans for their patients who do not have insurance, but you will need to ask so you aren't surprised when you get to your appointment.  2) Contact Your Local Health Department Not all health departments have doctors that can see patients for sick visits, but many do, so it is worth a call to see if yours does. If you don't know where your local health department is, you can check in your phone book. The CDC also has a tool to help you locate your state's health department, and many state websites also have listings of all of their local health departments.  3) Find a [facility] If your illness is not likely to be very severe or complicated, you may want to try a walk in clinic. These are popping up all over the country in pharmacies, drugstores, and shopping centers. They're usually staffed by nurse practitioners or physician assistants that have been trained to treat common illnesses and complaints. They're usually fairly quick and inexpensive. However, if you have serious medical issues or chronic medical problems, these are probably not your best option.  No Primary Care Doctor: - Call Health Connect at  [phone] - they can help you locate a primary care doctor that  accepts your insurance, provides certain services, etc. - Physician Referral Service- [phone]  Chronic Pain Problems: Organization         Address  Phone   Notes  [facility]  [phone] Patients need to be referred by their primary care doctor.   Medication Assistance: Organization         Address  Phone   Notes  [facility] Medication [facility] [address] [place], [place] [address] [phone] --Must be a resident of [facility] -- Must have NO insurance coverage whatsoever (no Medicaid/ Medicare, etc.) -- The pt. MUST have a primary care doctor that directs their care regularly and follows them in the community   [facility]ist  [phone]   [employer]  [phone]    Agencies that provide inexpensive medical care: Organization         Address  Phone   Notes  [place]  [phone]   [facility]    [phone]   [facility] [place], [place] [address] [phone]   [place] [address] [place] [phone]   Planned Parenthood    [phone]   [facility]    [phone]   [place] and [place] [address], [place] Phone:  [phone], Fax:  [phone] Hours of Operation:  9 am - 6 pm, M-F.  Also accepts Medicaid/Medicare and self-pay.  [facility] for [place], [address], [place] Phone: [phone], Fax: [phone]. Hours of  Operation:  8:30 am - 5:30 pm, M-F.  Also accepts Medicaid and self-pay.  [facility] [place] [address] [place] Phone: [phone]   [place], [place], [place] [phone], Ext. [phone] Mondays & Thursdays: 7-9 AM.  First 15 patients are seen on a first come, first serve basis.    [place] Providers:  Organization         Address  Phone   Notes  [facility] [address], [place] [phone] Also accepts self-pay patients.  [facility] [address], [place]  [phone]   [place], [address], [place] [phone]   [facility] [address] [place] [phone]   Lucianne Lei [address], [place]   [phone] Only accepts [place] Access [place] patients after they have their name applied to their card.   Self-Pay (no insurance) in [facility]:  Organization         Address  Phone   Notes  Sickle Cell Patients, [facility] [place] [phone]   [facility]gent Care [place] [phone]   [facility] [place]  [place], [place], [place] [phone]   [facility]/Dr. Osei-Bonsu  [address], [place] or [address], [place] [phone] Phone number for both [place] and [place] locations is the same.  Urgent Medical and [facility] [address], [place] [phone]   [facility] [address], [place] or [address] [phone]   [facility] [address], [place] [phone], phone; [phone], fax Sees patients 1st and 3rd Saturday of every month.  Must not qualify for public or private insurance (i.e. Medicaid, Medicare, [place] Health Choice, Veterans' Benefits)  Household income should be no more than 200% of the poverty level The clinic cannot treat you if you are pregnant or think you are pregnant  Sexually transmitted diseases are not treated at the clinic.    Dental Care: Organization         Address  Phone  Notes  [facility] Department of [facility] [place] [phone] Accepts children up to age 56 who are enrolled in [place] or [place]; pregnant women with a Medicaid card; and children who have applied for Medicaid or [place] Health  Choice, but were declined, whose parents can pay a reduced fee at time of service.  [facility] Department of [facility]  [address], [place] [phone] Accepts children up to age 43 who are enrolled in [place] or [place]; pregnant women with a Medicaid card; and children who have applied for Medicaid or [place] Health Choice, but were declined, whose parents can pay a reduced fee at time of service.  [place] Adult Dental Access PROGRAM  [place] [phone] Patients are seen by appointment only. Walk-ins are not accepted. [place] will see patients 74 years of age and older. Monday - Tuesday (8am-5pm) Most Wednesdays (8:30-5pm) $30 per visit, cash only  [facility] Adult Dental Access PROGRAM  [address], [facility] [phone] Patients are seen  by appointment only. Walk-ins are not accepted. [place] will see patients 67 years of age and older. One Wednesday Evening (Monthly: Volunteer Based).  $30 per visit, cash only  [place]  [phone] for adults; Children under age 78, call Graduate Pediatric Dentistry at [phone]. Children aged 72-14, please call [phone] to request a pediatric application.  Dental services are provided in all areas of dental care including fillings, crowns and bridges, complete and partial dentures, implants, gum treatment, root canals, and extractions. Preventive care is also provided. Treatment is provided to both adults and children. Patients are selected via a lottery and there is often a waiting list.   [facility] [address], [place]  [phone] [email or web address]   [facility] [address] [place], [place] [phone], Ext. [phone] Second and Fourth Thursday of each month, opens at 6:30 AM; Clinic ends at 9 AM.  Patients are seen on a first-come first-served basis, and a limited number are seen during each  clinic.   [facility]  [address] Hillard Danker [place], [place] [phone]   Eligibility Requirements You must have lived in [place], [place], or [place] counties for at least the last three months.   You cannot be eligible for state or federal sponsored [employer], including [employer], [place], or [employer].   You generally cannot be eligible for healthcare insurance through your employer.    How to apply: Eligibility screenings are held every Tuesday and Wednesday afternoon from 1:00 pm until 4:00 pm. You do not need an appointment for the interview!  [facility] [address], [place], [place]   [place]  [place] Department  [place]  [phone]    Behavioral Health Resources in the Community: Intensive Outpatient Programs Organization         Address  Phone  Notes  [place]. [address], [place], [place] [phone]   [facility] Outpatient [address], [place], [place]   ADS: Alcohol & Drug Svcs [address], [place], [place]   [place] [address]  [place], [place] or [phone]   Substance Abuse Resources Organization         Address  Phone  Notes  Alcohol and Drug Services  [phone]   [place]  [phone]   The [place]   Chinita Pester  [phone]   Residential & Outpatient Substance Abuse Program  [phone]   Psychological Services Organization         Address  Phone  Notes  [facility] [place]  [place]  [phone]   [place] [address], [place] or [phone]    Mobile Crisis Teams Organization         Address  Phone  Notes  [facility], Mobile  Crisis Care Unit  [phone]   [facility]  [address] [place], [place]   [place] [address] [place] [phone]    Self-Help/Support Groups Organization         Address  Phone             Notes  [place]. of [place] - variety of support groups  [place] Call for more  information  Narcotics Anonymous (NA), [facility] [address], [place]  2 meetings at this location   Residential Facilities manager         Address  Phone  Notes  [facility] [place],    [place]  [phone]   [facility]  [address], [place] [phone], [place], [place]   [place], [place] [phone] Admissions: 8am-3pm M-F  Incentives Substance [place] [address]    [place], [place] [phone]   [facility] [address] [place], [place], [place]   [facility] [address],  [place], [place]   [facility] Programs - Intensive Outpatient [address], [place], [place]   [facility] ([place].,  [place], [place] [phone] or [phone]   [facility] Services (RTS) [address], [place], [place] Accepts Medicaid  [facility] [place] [address]  [place] [phone] Substance Abuse/Addiction Treatment   [facility] Organization         Address  Phone  Notes  [facility]  [phone]   Domenic Schwab, PhD [address] [place]lis [place], [place]   [phone] or [phone]   [place], [place] [phone]   [facility] [address] [place], [place] [phone] Insurance/Medicaid/sponsorship through [facility] and Families [address], [address]  [place]                                    [place], [place] [phone] [place] [address], [place] [phone]    Dr. Adele Schilder  [phone]   [facility] Dept. 1) [address],  2) [place] 3)  [place] [address], [place] [phone]  [phone]   [place] [phone] or [phone] (After Hours)

## 2014-12-14 NOTE — ED Notes (Signed)
Reports she has not had a period for the past 4 months. Reports abd pain and feels like something is kicking her at times.

## 2014-12-14 NOTE — ED Provider Notes (Addendum)
CSN: [id number]     Arrival date & time 12/14/14  1339 History   First MD Initiated Contact with Patient 12/14/14 1552     Chief Complaint  Patient presents with  . Abdominal Pain     (Consider location/radiation/quality/duration/timing/severity/associated sxs/prior Treatment) HPI..... Generalized abdominal pain for unknown length of time. Patient thinks there is something kicking in her abdomen. Last menstrual period July 2016. She is presently in a rehabilitation facility for drug abuse. She is able to eat. No fevers, sweats, chills, vomiting, diarrhea, dysuria. Past medical history includes bipolar disorder, PTSD, stroke, seizure  Past Medical History  Diagnosis Date  . Seizures ([place])     last seizure 2 weeks ago  . Stroke ([place])   . Brain tumor (benign) ([place])   . PTSD (post-traumatic stress disorder)   . Bipolar 1 disorder ([place])   . Personality disorder    Past Surgical History  Procedure Laterality Date  . Tubal ligation    . Tumor removal    . Leg surgery     History reviewed. No pertinent family history. Social History  Substance Use Topics  . Smoking status: Current Every Day Smoker -- 0.50 packs/day    Types: Cigarettes  . Smokeless tobacco: Never Used  . Alcohol Use: No   OB History    No data available     Review of Systems  All other systems reviewed and are negative.     Allergies  Sulfa antibiotics and Vicodin  Home Medications   Prior to Admission medications   Medication Sig Start Date End Date Taking? Authorizing Provider  gabapentin (NEURONTIN) 300 MG capsule Take 300 mg by mouth 3 (three) times daily.   Yes Historical Provider, MD  levETIRAcetam (KEPPRA) 1000 MG tablet Take 1,000 mg by mouth 2 (two) times daily. 1000mg  am 2000mg  qpm   Yes Historical Provider, MD  methadone (DOLOPHINE) 10 MG/5ML solution Take 90 mg by mouth daily.   Yes Historical Provider, MD  pantoprazole (PROTONIX) 40 MG tablet Take 40 mg by mouth daily. 06/22/13  Yes  Historical Provider, MD  sertraline (ZOLOFT) 100 MG tablet Take 100 mg by mouth daily.   Yes Historical Provider, MD  topiramate (TOPAMAX) 50 MG tablet Take 50 mg by mouth 3 (three) times daily. Take 1 tab in the morning and 2 tabs in the evening   Yes Historical Provider, MD   BP 96/53 mmHg  Pulse 65  Temp(Src) 97.8 F (36.6 C) (Oral)  Resp 14  Ht 5\' 6"  (1.676 m)  Wt 160 lb 11.2 oz (72.893 kg)  BMI 25.95 kg/m2  SpO2 100%  LMP 09/13/2014 Physical Exam  Constitutional: She is oriented to person, place, and time. She appears well-developed and well-nourished.  HENT:  Head: Normocephalic and atraumatic.  Eyes: Conjunctivae and EOM are normal. Pupils are equal, round, and reactive to light.  Neck: Normal range of motion. Neck supple.  Cardiovascular: Normal rate and regular rhythm.   Pulmonary/Chest: Effort normal and breath sounds normal.  Abdominal: Soft. Bowel sounds are normal.  Min diffuse tenderness  Musculoskeletal: Normal range of motion.  Neurological: She is alert and oriented to person, place, and time.  Skin: Skin is warm and dry.  Psychiatric: She has a normal mood and affect. Her behavior is normal.  Nursing note and vitals reviewed.   ED Course  Procedures (including critical care time) Labs Review Labs Reviewed  COMPREHENSIVE METABOLIC PANEL - Abnormal; Notable for the following:    Chloride 113 (*)  CO2 21 (*)    Creatinine, Ser 1.07 (*)    Total Protein 6.0 (*)    All other components within normal limits  URINALYSIS, ROUTINE W REFLEX MICROSCOPIC (NOT AT [facility]) - Abnormal; Notable for the following:    Color, Urine AMBER (*)    APPearance CLOUDY (*)    Specific Gravity, Urine 1.034 (*)    Bilirubin Urine SMALL (*)    All other components within normal limits  LIPASE, BLOOD  CBC  ETHANOL  URINE RAPID DRUG SCREEN, HOSP PERFORMED  I-STAT BETA HCG BLOOD, ED ([facility], [facility], AP ONLY)    Imaging Review No results found. I have personally reviewed and evaluated  these images and lab results as part of my medical decision-making.   EKG Interpretation None      MDM   Final diagnoses:  Abdominal pain, unspecified abdominal location   No acute abdomen. Screening labs and urinalysis negative for acute findings. Will obtain alcohol and drug screen.    Nat Christen, MD 12/14/14 1812  Nat Christen, MD 12/16/14 [phone]

## 2014-12-20 ENCOUNTER — Emergency Department (HOSPITAL_COMMUNITY): Payer: Medicaid Other

## 2014-12-20 ENCOUNTER — Encounter (HOSPITAL_COMMUNITY): Payer: Self-pay | Admitting: *Deleted

## 2014-12-20 ENCOUNTER — Emergency Department (HOSPITAL_COMMUNITY)
Admission: EM | Admit: 2014-12-20 | Discharge: 2014-12-20 | Disposition: A | Discharge status: Home / Self Care | Payer: Medicaid Other | Attending: Emergency Medicine | Admitting: Emergency Medicine

## 2014-12-20 DIAGNOSIS — G40909 Epilepsy, unspecified, not intractable, without status epilepticus: Secondary | ICD-10-CM | POA: Diagnosis not present

## 2014-12-20 DIAGNOSIS — Y9289 Other specified places as the place of occurrence of the external cause: Secondary | ICD-10-CM | POA: Insufficient documentation

## 2014-12-20 DIAGNOSIS — Z86011 Personal history of benign neoplasm of the brain: Secondary | ICD-10-CM | POA: Insufficient documentation

## 2014-12-20 DIAGNOSIS — Y998 Other external cause status: Secondary | ICD-10-CM | POA: Insufficient documentation

## 2014-12-20 DIAGNOSIS — Z720 Tobacco use: Secondary | ICD-10-CM | POA: Diagnosis not present

## 2014-12-20 DIAGNOSIS — W500XXA Accidental hit or strike by another person, initial encounter: Secondary | ICD-10-CM | POA: Diagnosis not present

## 2014-12-20 DIAGNOSIS — S0093XA Contusion of unspecified part of head, initial encounter: Secondary | ICD-10-CM

## 2014-12-20 DIAGNOSIS — S0083XA Contusion of other part of head, initial encounter: Secondary | ICD-10-CM | POA: Diagnosis not present

## 2014-12-20 DIAGNOSIS — Y9389 Activity, other specified: Secondary | ICD-10-CM | POA: Diagnosis not present

## 2014-12-20 DIAGNOSIS — S0990XA Unspecified injury of head, initial encounter: Secondary | ICD-10-CM | POA: Diagnosis present

## 2014-12-20 DIAGNOSIS — Z79899 Other long term (current) drug therapy: Secondary | ICD-10-CM | POA: Insufficient documentation

## 2014-12-20 DIAGNOSIS — Z8673 Personal history of transient ischemic attack (TIA), and cerebral infarction without residual deficits: Secondary | ICD-10-CM | POA: Insufficient documentation

## 2014-12-20 DIAGNOSIS — F319 Bipolar disorder, unspecified: Secondary | ICD-10-CM | POA: Diagnosis not present

## 2014-12-20 MED ORDER — ONDANSETRON HCL 4 MG PO TABS: 4.0000 mg | ORAL_TABLET | Freq: Three times a day (TID) | ORAL | Status: DC | PRN

## 2014-12-20 MED ORDER — ONDANSETRON 4 MG PO TBDP: ORAL_TABLET | ORAL | Status: DC
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
## 2014-12-21 NOTE — Discharge Instructions (Signed)
Please follow up with your PCP   Contusion A contusion is a deep bruise. Contusions are the result of a blunt injury to tissues and muscle fibers under the skin. The injury causes bleeding under the skin. The skin overlying the contusion may turn blue, purple, or yellow. Minor injuries will give you a painless contusion, but more severe contusions may stay painful and swollen for a few weeks.  CAUSES  This condition is usually caused by a blow, trauma, or direct force to an area of the body. SYMPTOMS  Symptoms of this condition include:  Swelling of the injured area.  Pain and tenderness in the injured area.  Discoloration. The area may have redness and then turn blue, purple, or yellow. DIAGNOSIS  This condition is diagnosed based on a physical exam and medical history. An X-ray, CT scan, or MRI may be needed to determine if there are any associated injuries, such as broken bones (fractures). TREATMENT  Specific treatment for this condition depends on what area of the body was injured. In general, the best treatment for a contusion is resting, icing, applying pressure to (compression), and elevating the injured area. This is often called the RICE strategy. Over-the-counter anti-inflammatory medicines may also be recommended for pain control.  HOME CARE INSTRUCTIONS   Rest the injured area.  If directed, apply ice to the injured area:  Put ice in a plastic bag.  Place a towel between your skin and the bag.  Leave the ice on for 20 minutes, 2-3 times per day.  If directed, apply light compression to the injured area using an elastic bandage. Make sure the bandage is not wrapped too tightly. Remove and reapply the bandage as directed by your health care provider.  If possible, raise (elevate) the injured area above the level of your heart while you are sitting or lying down.  Take over-the-counter and prescription medicines only as told by your health care provider. SEEK MEDICAL  CARE IF:  Your symptoms do not improve after several days of treatment.  Your symptoms get worse.  You have difficulty moving the injured area. SEEK IMMEDIATE MEDICAL CARE IF:   You have severe pain.  You have numbness in a hand or foot.  Your hand or foot turns pale or cold.   This information is not intended to replace advice given to you by your health care provider. Make sure you discuss any questions you have with your health care provider.   Document Released: 11/19/2004 Document Revised: 10/31/2014 Document Reviewed: 06/27/2014 Elsevier Interactive Patient Education 2016 [employer].  Emergency Department Resource Guide 1) Find a Doctor and Pay Out of Pocket Although you won't have to find out who is covered by your insurance plan, it is a good idea to ask around and get recommendations. You will then need to call the office and see if the doctor you have chosen will accept you as a new patient and what types of options they offer for patients who are self-pay. Some doctors offer discounts or will set up payment plans for their patients who do not have insurance, but you will need to ask so you aren't surprised when you get to your appointment.  2) Contact Your Local Health Department Not all health departments have doctors that can see patients for sick visits, but many do, so it is worth a call to see if yours does. If you don't know where your local health department is, you can check in your phone book. The  CDC also has a tool to help you locate your state's health department, and many state websites also have listings of all of their local health departments.  3) Find a [facility] If your illness is not likely to be very severe or complicated, you may want to try a walk in clinic. These are popping up all over the country in pharmacies, drugstores, and shopping centers. They're usually staffed by nurse practitioners or physician assistants that have been trained to treat  common illnesses and complaints. They're usually fairly quick and inexpensive. However, if you have serious medical issues or chronic medical problems, these are probably not your best option.   Chronic Pain Problems: Organization         Address     Phone             Notes  [facility]  [phone] Patients need to be referred by their primary care doctor.   Medication Assistance: Organization         Address     Phone             Notes  [facility] Medication [facility] [address] [place], [place] [address] [phone] --Must be a resident of [facility] -- Must have NO insurance coverage whatsoever (no Medicaid/ Medicare, etc.) -- The pt. MUST have a primary care doctor that directs their care regularly and follows them in the community   MedAssist  [phone]   [employer]  [phone]    Agencies that provide inexpensive medical care: Organization         Address     Phone             Notes  [place]  [phone]   [facility]    [phone]   [facility] [place], [place] [address] [phone]   [place] [address], [place] [phone]   Planned Parenthood    [phone]   [facility]    [phone]   [place] [address], [place] Phone:  [phone], Fax:  [phone] Hours of Operation:  9 am - 6 pm, M-F.  Also accepts Medicaid/Medicare and self-pay.  [facility] for [address] [place], [address], [place] Phone: [phone], Fax: [phone]. Hours of Operation:  8:30 am - 5:30 pm, M-F.  Also accepts Medicaid and self-pay.  [facility] [place] [address], [place] Phone: [phone]   [place], [place], [place] [phone], Ext. [phone] Mondays & Thursdays: 7-9 AM.   First 15 patients are seen on a first come, first serve basis.   [facility] [address] [place] [address] [phone] Accepts Medicaid   [place] Providers:  Organization         Address     Phone             Notes  [facility] [address], [place] [phone] Also accepts self-pay patients.  [place], [place]  [phone]   [place], [address], [place] [phone]   [facility] [address] [facility]  [address], [place] [phone]   Lucianne Lei [address], [place]   [phone] Only accepts [place] Access [place] patients after they have their name applied to their card.   Self-Pay (no insurance) in [facility]:  Organization         Address     Phone             Notes  Sickle Cell Patients, [facility] [place] [phone]   [facility] [place] [phone]   [facility] [place]  [place], [place], [place] [phone]   [facility]/Dr. Osei-Bonsu  [address] [place] or [address], [place] [phone] Phone number for both [place] and [place] locations is the same.  [facility] [address], [place] [phone]   [facility] [address], [place] or [address] [phone]   [facility] [address], [place] [phone], phone; [phone], fax Sees patients 1st and 3rd Saturday of every month.  Must not qualify for public or private insurance (i.e. Medicaid, Medicare, [place] Health Choice, Veterans' Benefits)  Household income should be no more than 200% of the poverty level The clinic cannot treat  you if you are pregnant or think you are pregnant  Sexually transmitted diseases are not treated at the clinic.    Dental Care:  Organization         Address     Phone             Notes  [facility] of [facility] [place] [phone] Accepts children up to age 9 who are enrolled in [place] or [place]; pregnant women with a Medicaid card; and children who have applied for Medicaid or [place] Health Choice, but were declined, whose parents can pay a reduced fee at time of service.  [facility] Department of [facility]  [address], [place] [phone] Accepts children up to age 4 who are enrolled in [place] or [place]; pregnant women with a Medicaid card; and children who have applied for Medicaid or [place] Health Choice, but were declined, whose parents can pay a reduced fee at time of service.  [facility] Adult Dental Access PROGRAM  [place] [phone] Patients are seen by appointment only. Walk-ins are not accepted. [place] will see patients 61 years of age and older. Monday - Tuesday (8am-5pm) Most Wednesdays (8:30-5pm) $30 per visit, cash only  [facility] Adult Dental Access PROGRAM  [address], [facility] [phone] Patients are seen by appointment only. Walk-ins are not accepted. [place] will see patients 65 years of age and older. One Wednesday Evening (Monthly: Volunteer Based).  $30 per visit, cash only  [place]  [phone] for adults; Children under age 89, call Graduate Pediatric Dentistry at [phone]. Children aged 28-14, please call [phone] to request a pediatric application.  Dental services are provided in all areas of dental care including fillings, crowns and bridges, complete and partial dentures, implants, gum treatment, root canals, and extractions. Preventive care is also  provided. Treatment is provided to both adults and children. Patients are selected via a lottery and  there is often a waiting list.   [facility] [address] [place]  [phone] [email or web address]   [facility] [address] [place], [place] [phone], Ext. [phone] Second and Fourth Thursday of each month, opens at 6:30 AM; Clinic ends at 9 AM.  Patients are seen on a first-come first-served basis, and a limited number are seen during each clinic.   [facility]  [address] Hillard Danker [place], [place] [phone]   Eligibility Requirements You must have lived in [place], [place], or [place] counties for at least the last three months.   You cannot be eligible for state or federal sponsored [employer], including [employer], [place], or [employer].   You generally cannot be eligible for healthcare insurance through your employer.    How to apply: Eligibility screenings are held every Tuesday and Wednesday afternoon from 1:00 pm until 4:00 pm. You do not need an appointment for the interview!  [facility] [address], [place], [place]   [place]  [employer] Department  [place]  [phone]    Behavioral Health Resources in the Community: Intensive Outpatient Programs Organization         Address     Phone             Notes  [place] Hayes. [address] [place], [place] [phone]   [facility] Outpatient [address], [place], [place]   ADS: Alcohol & Drug Svcs [address] [place], [place]   [place] [address]  [place], [place] or [phone]     Substance Abuse Resources Organization         Address     Phone             Notes  Alcohol and Drug Services  [phone]     [place]  [phone]   [facility] [place]   Chinita Pester  [phone]   Residential & Outpatient Substance Abuse Program  [phone]   Psychological Services Organization         Address     Phone             Notes  [facility]  [place]  [phone]   [place] [address] [place] or [phone]    Mobile Crisis Teams Organization         Address     Phone             Notes  [facility], Mobile Crisis Care Unit  [phone]   [facility]  [address] [place], [place]   [place] Levels [address] [place] [phone]    Self-Help/Support Groups Organization         Address     Phone             Notes  [place]. of [place] - variety of support groups  [place] Call for more information  Narcotics Anonymous (NA), [facility] [address], [employer] [place]  2 meetings at this location   Charity fundraiser  Notes  [facility] [address],    [place]  [phone]   [facility]  [address] [place] [address], [place], [place]   [place], [place] [phone] Admissions: 8am-3pm M-F  Incentives Substance [place] [address]    [place], [place] [phone]   [facility] [address] [place], [place], [place]   [facility] [address],  [place], [place]   [facility] Programs - Intensive Outpatient [address] Krist[address], [place], [place]   [facility] ([place].) [address].,  [place], [place] [phone] or [phone]   [facility] (RTS) [address] [place], [place] Accepts Medicaid   [facility] [place] [address],  [place] [phone] Substance Abuse/Addiction Treatment   [facility] Organization         Address     Phone             Notes  [facility]  [phone]   Domenic Schwab, PhD [address] Arlis Porta [place], [place]   [phone] or [phone]   Paxton DeWitt [place], [place] [phone]   [facility] [address] [place], [place] [phone] Insurance/Medicaid/sponsorship through [facility] and Families [address], [address]                                    [place], [place] [phone] [place] [address] [place], [place] [phone]    Dr. Adele Schilder  [phone]   [facility] Dept. 1) [address], [place] 2) [place] 3)  [address], [place] [phone]  [phone]   [place] [phone] or [phone] (After Hours)     [place]: Abuse and Neglect Organization         Address     Phone             Notes  Child/Elder Abuse Hotline  (650)070-5658   Family Abuse Services  6368291990 24 hour crisis line  [place] Telemark  [place] & Substance Use Organization         Address     Phone             Notes  [employer]   [phone] 24 hour crisis line  Advance Access  [address], [place] [phone] Monday- Friday, walk-in,  8am-8pm  RTSA Detoxification & Crisis Stabilization  [phone]   Alcoholics Anonymous (  [phone] [employer]  Narcotics Anonymous  [place] & Urgent Care Centers Organization         Address     Phone              Notes  [facility] Department  [place] at [place]   Christian   [facility]  [phone] Uninsured patients meeting eligibility requirement  [facility]  [place]  [phone]      [place]  [place]  [facility]   [place]  [phone]     Additional [facility] Resources Organization         Address     Phone             Notes  [place] and [place]  [place]  9384901251 Medicaid, Nutrition, Medicine Assistance, Utility Assistance  [place] [phone]   [place] [facility]  [phone]   [place]  [phone] [place] of Selena Lesser  [phone] Adult & family shelter, food, utility & rent assistance  24 Hour crisis line for those facing homelessness  671-034-1990   [facility]  [phone] Roxy Manns, [facility] public transportation system  Homecare Providers  [phone] HIV/AIDS Case Management, FREE HIV SCREEN  Medication Management  [phone] Ongoing medication assistance for patients meeting eligibility requirements  Medication Drop Box Locations: [place] Dept., [facility] Police Dept., [place] Dept., [facility] office  Safely rid of unused medications  [employer]  [phone] Crisis assistance, medication, housing, food, utility assistance  [place].  [facility])  [phone]

## 2014-12-30 ENCOUNTER — Emergency Department (HOSPITAL_COMMUNITY): Payer: Medicaid Other

## 2014-12-30 ENCOUNTER — Emergency Department (HOSPITAL_COMMUNITY)
Admission: EM | Admit: 2014-12-30 | Discharge: 2014-12-30 | Disposition: A | Discharge status: Home / Self Care | Payer: Medicaid Other | Attending: Emergency Medicine | Admitting: Emergency Medicine

## 2014-12-30 ENCOUNTER — Encounter (HOSPITAL_COMMUNITY): Payer: Self-pay | Admitting: Emergency Medicine

## 2014-12-30 DIAGNOSIS — F4310 Post-traumatic stress disorder, unspecified: Secondary | ICD-10-CM | POA: Diagnosis not present

## 2014-12-30 DIAGNOSIS — Z86011 Personal history of benign neoplasm of the brain: Secondary | ICD-10-CM | POA: Insufficient documentation

## 2014-12-30 DIAGNOSIS — W51XXXA Accidental striking against or bumped into by another person, initial encounter: Secondary | ICD-10-CM | POA: Diagnosis not present

## 2014-12-30 DIAGNOSIS — F319 Bipolar disorder, unspecified: Secondary | ICD-10-CM | POA: Insufficient documentation

## 2014-12-30 DIAGNOSIS — F609 Personality disorder, unspecified: Secondary | ICD-10-CM | POA: Diagnosis not present

## 2014-12-30 DIAGNOSIS — Y9389 Activity, other specified: Secondary | ICD-10-CM | POA: Diagnosis not present

## 2014-12-30 DIAGNOSIS — S99911A Unspecified injury of right ankle, initial encounter: Secondary | ICD-10-CM | POA: Diagnosis not present

## 2014-12-30 DIAGNOSIS — Z720 Tobacco use: Secondary | ICD-10-CM | POA: Insufficient documentation

## 2014-12-30 DIAGNOSIS — Z79899 Other long term (current) drug therapy: Secondary | ICD-10-CM | POA: Diagnosis not present

## 2014-12-30 DIAGNOSIS — Y9289 Other specified places as the place of occurrence of the external cause: Secondary | ICD-10-CM | POA: Insufficient documentation

## 2014-12-30 DIAGNOSIS — Z79891 Long term (current) use of opiate analgesic: Secondary | ICD-10-CM | POA: Diagnosis not present

## 2014-12-30 DIAGNOSIS — G40909 Epilepsy, unspecified, not intractable, without status epilepticus: Secondary | ICD-10-CM | POA: Diagnosis not present

## 2014-12-30 DIAGNOSIS — Z8673 Personal history of transient ischemic attack (TIA), and cerebral infarction without residual deficits: Secondary | ICD-10-CM | POA: Insufficient documentation

## 2014-12-30 DIAGNOSIS — S99921A Unspecified injury of right foot, initial encounter: Secondary | ICD-10-CM | POA: Diagnosis present

## 2014-12-30 DIAGNOSIS — Y998 Other external cause status: Secondary | ICD-10-CM | POA: Diagnosis not present

## 2014-12-30 DIAGNOSIS — M25571 Pain in right ankle and joints of right foot: Secondary | ICD-10-CM

## 2014-12-30 DIAGNOSIS — M79671 Pain in right foot: Secondary | ICD-10-CM

## 2014-12-30 MED ORDER — IBUPROFEN 800 MG PO TABS
800.0000 mg | ORAL_TABLET | Freq: Three times a day (TID) | ORAL | Status: DC
Start: 2014-12-30 — End: 2021-10-27

## 2014-12-30 MED ORDER — IBUPROFEN 800 MG PO TABS
800.0000 mg | ORAL_TABLET | Freq: Once | ORAL | Status: AC
  Administered 2014-12-30: 800 mg via ORAL
  Filled 2014-12-30: qty 1

## 2014-12-30 NOTE — Discharge Instructions (Signed)
1. Medications: ibuprofen, usual home medications 2. Treatment: rest, drink plenty of fluids, ice, elevate, wear ankle brace 3. Follow Up: please followup with your primary doctor for discussion of your diagnoses and further evaluation after today's visit; if you do not have a primary care doctor use the resource guide provided to find one; please return to the ER for severe pain or swelling, inability to feel your foot, significant color changes, new or worsening symptoms   Ankle Pain Ankle pain is a common symptom. The bones, cartilage, tendons, and muscles of the ankle joint perform a lot of work each day. The ankle joint holds your body weight and allows you to move around. Ankle pain can occur on either side or back of 1 or both ankles. Ankle pain may be sharp and burning or dull and aching. There may be tenderness, stiffness, redness, or warmth around the ankle. The pain occurs more often when a person walks or puts pressure on the ankle. CAUSES  There are many reasons ankle pain can develop. It is important to work with your caregiver to identify the cause since many conditions can impact the bones, cartilage, muscles, and tendons. Causes for ankle pain include:  Injury, including a break (fracture), sprain, or strain often due to a fall, sports, or a high-impact activity.  Swelling (inflammation) of a tendon (tendonitis).  Achilles tendon rupture.  Ankle instability after repeated sprains and strains.  Poor foot alignment.  Pressure on a nerve (tarsal tunnel syndrome).  Arthritis in the ankle or the lining of the ankle.  Crystal formation in the ankle (gout or pseudogout). DIAGNOSIS  A diagnosis is based on your medical history, your symptoms, results of your physical exam, and results of diagnostic tests. Diagnostic tests may include X-ray exams or a computerized magnetic scan (magnetic resonance imaging, MRI). TREATMENT  Treatment will depend on the cause of your ankle pain and  may include:  Keeping pressure off the ankle and limiting activities.  Using crutches or other walking support (a cane or brace).  Using rest, ice, compression, and elevation.  Participating in physical therapy or home exercises.  Wearing shoe inserts or special shoes.  Losing weight.  Taking medications to reduce pain or swelling or receiving an injection.  Undergoing surgery. HOME CARE INSTRUCTIONS   Only take over-the-counter or prescription medicines for pain, discomfort, or fever as directed by your caregiver.  Put ice on the injured area.  Put ice in a plastic bag.  Place a towel between your skin and the bag.  Leave the ice on for 15-20 minutes at a time, 03-04 times a day.  Keep your leg raised (elevated) when possible to lessen swelling.  Avoid activities that cause ankle pain.  Follow specific exercises as directed by your caregiver.  Record how often you have ankle pain, the location of the pain, and what it feels like. This information may be helpful to you and your caregiver.  Ask your caregiver about returning to work or sports and whether you should drive.  Follow up with your caregiver for further examination, therapy, or testing as directed. SEEK MEDICAL CARE IF:   Pain or swelling continues or worsens beyond 1 week.  You have an oral temperature above 102 F (38.9 C).  You are feeling unwell or have chills.  You are having an increasingly difficult time with walking.  You have loss of sensation or other new symptoms.  You have questions or concerns. MAKE SURE YOU:   Understand these  instructions.  Will watch your condition.  Will get help right away if you are not doing well or get worse.   This information is not intended to replace advice given to you by your health care provider. Make sure you discuss any questions you have with your health care provider.   Document Released: 07/30/2009 Document Revised: 05/04/2011 Document  Reviewed: 09/11/2014 Elsevier Interactive Patient Education 2016 [employer].   Emergency Department Resource Guide 1) Find a Doctor and Pay Out of Pocket Although you won't have to find out who is covered by your insurance plan, it is a good idea to ask around and get recommendations. You will then need to call the office and see if the doctor you have chosen will accept you as a new patient and what types of options they offer for patients who are self-pay. Some doctors offer discounts or will set up payment plans for their patients who do not have insurance, but you will need to ask so you aren't surprised when you get to your appointment.  2) Contact Your Local Health Department Not all health departments have doctors that can see patients for sick visits, but many do, so it is worth a call to see if yours does. If you don't know where your local health department is, you can check in your phone book. The CDC also has a tool to help you locate your state's health department, and many state websites also have listings of all of their local health departments.  3) Find a [facility] If your illness is not likely to be very severe or complicated, you may want to try a walk in clinic. These are popping up all over the country in pharmacies, drugstores, and shopping centers. They're usually staffed by nurse practitioners or physician assistants that have been trained to treat common illnesses and complaints. They're usually fairly quick and inexpensive. However, if you have serious medical issues or chronic medical problems, these are probably not your best option.  No Primary Care Doctor: - Call Health Connect at  [phone] - they can help you locate a primary care doctor that  accepts your insurance, provides certain services, etc. - Physician Referral Service- [phone]  Chronic Pain Problems: Organization         Address  Phone   Notes  [facility]  [phone]  Patients need to be referred by their primary care doctor.   Medication Assistance: Organization         Address  Phone   Notes  [facility] Medication [facility] [place], [place] [address] [phone] --Must be a resident of [facility] -- Must have NO insurance coverage whatsoever (no Medicaid/ Medicare, etc.) -- The pt. MUST have a primary care doctor that directs their care regularly and follows them in the community   MedAssist  [phone]   [employer]  [phone]    Agencies that provide inexpensive medical care: Organization         Address  Phone   Notes  [place]  [phone]   [facility]    [phone]   [facility] [place], [place] [address] [phone]   Levy Polson [phone]   Planned Parenthood    [phone]   [facility]    [phone]   Community Health and  [address], [place] Phone:  [phone], Fax:  [phone] Hours of Operation:  9 am - 6 pm, M-F.  Also accepts Medicaid/Medicare and self-pay.  [facility] for [place] [address], [address], [place] Phone: [phone], Fax: [phone]. Hours of Operation:  8:30 am - 5:30 pm, M-F.  Also accepts Medicaid and self-pay.  [facility] [place] [address] [place] Phone: [phone]   [place], [place], [place] [phone], Ext. [phone] Mondays & Thursdays: 7-9 AM.  First 15 patients are seen on a first come, first serve basis.    [place] Providers:  Organization         Address  Phone   Notes  [facility] [address], [place] [phone] Also accepts self-pay patients.  [facility] [address], [place]  [phone]   [place], [address], [place] [phone]   [facility] [address] [place] [phone]   Lucianne Lei [address], [place]   [phone] Only accepts [place] Access [place] patients after they have their name applied to their card.   Self-Pay (no insurance) in [facility]:  Organization         Address  Phone   Notes  Sickle Cell Patients, [facility] [place] [phone]   [facility] [place] [phone]   [facility] [place]  [place], [place], [place] [phone]   [facility]/Dr. Osei-Bonsu  [address] [place] or [address], [place] [phone] Phone number for both [place] and [place] locations is the same.  [facility] [address] [place] [phone]   [facility] [address], [place] or [address] [phone]   [facility] [address] [place] [phone], phone; [phone], fax Sees patients 1st and 3rd Saturday of every month.  Must not qualify for public or private insurance (i.e. Medicaid, Medicare, [place] Health Choice, Veterans' Benefits)  Household income should be no more than 200% of the poverty level The clinic cannot treat you if you are pregnant or think you are pregnant  Sexually transmitted diseases are not treated at the clinic.    Dental Care: Organization         Address  Phone  Notes  [facility] Department of [facility] [phone] Accepts children up to age 54 who are enrolled in [place] or [place]; pregnant women with a Medicaid card; and children who have applied for Medicaid or [place] Health Choice, but were  declined, whose parents can pay a reduced fee at time of service.  [facility] Department of [facility]  [address], [place] [phone] Accepts children up to age 15 who are enrolled in [place] or [place]; pregnant women with a Medicaid card; and children who have applied for Medicaid or [place] Health Choice, but were declined, whose parents can pay a reduced fee at time of service.  [place] Adult [facility] Access PROGRAM  [place],  [place] [phone] Patients are seen by appointment only. Walk-ins are not accepted. [place] will see patients 27 years of age and older. Monday - Tuesday (8am-5pm) Most Wednesdays (8:30-5pm) $30 per visit, cash only  [facility] Adult Dental Access PROGRAM  [address], [facility] [phone] Patients are seen by appointment only. Walk-ins are not accepted. [place] will see patients 82 years of age and older. One Wednesday Evening (Monthly: Volunteer Based).  $30 per visit, cash only  [place]  [phone] for adults; Children under age 65, call Graduate Pediatric Dentistry at [phone]. Children aged 69-14, please call [phone] to request a pediatric application.  Dental services are provided in all areas of dental care including fillings, crowns and bridges, complete and partial dentures, implants, gum treatment, root canals, and extractions. Preventive care is also provided. Treatment is provided to both adults and children. Patients are selected via a lottery and there is often a waiting list.   [facility] [address] [place]  [phone] [email or web address]   [facility] [address] [place], [place] [phone], Ext. [phone] Second and Fourth Thursday of each month, opens at 6:30 AM; Clinic ends at 9 AM.  Patients are seen on a first-come first-served basis, and a limited number are seen during each clinic.    [facility]  [address] Hillard Danker [place], [place] [phone]   Eligibility Requirements You must have lived in [place], [place], or [place] counties for at least the last three months.   You cannot be eligible for state or federal sponsored [employer], including [employer], [place], or [employer].   You generally cannot be eligible for healthcare insurance through your employer.    How to apply: Eligibility screenings are held every Tuesday and Wednesday afternoon from 1:00 pm until 4:00 pm. You do not need an appointment for the interview!  [facility] [address], [place], [place]   [place]edom  [place] Department  [place]  [phone]    Behavioral Health Resources in the Community: Intensive Outpatient Programs Organization         Address  Phone  Notes  [place]. [address], [place], [place] [phone]   [facility]patient [address], [place], [place]   ADS: Alcohol [facility] Drug [facility] [address] [place], [place]   [place] [address],  [place], [place] or [phone]   Substance Abuse Resources Organization         Address  Phone  Notes  Alcohol and Drug Services  [phone]   [place]  [phone]   [facility] [place]   Chinita Pester  [phone]   Residential & Outpatient Substance Abuse Program  [phone]   Psychological Services Organization         Address  Phone  Notes  [facility] [place]  [place]  [phone]   [place] [address], [place] or [phone]    Mobile Crisis Teams Organization         Address  Phone  Notes  [facility], Mobile Crisis Care  Unit  [phone]   Assertive Psychotherapeutic Services  [address] [place], [place]   [place] [phone]  [address] [place] [phone]    Self-Help/Support Groups Organization         Address  Phone             Notes  Mental Health Assoc. of [place] - variety of support groups  [place] Call for more information  Narcotics Anonymous (NA), [facility] [address], [employer] [place]  2 meetings at this location   Special educational needs teacher         Address  Phone  Notes  [facility] Residential Treatment [place],    [place]  [phone]   [facility]  [address] [place] [address], [place], [place]   [place], [place] [phone] Admissions: 8am-3pm M-F  Incentives Substance [place] [address]    [place], [place] [phone]   [facility] [address] [place], [place], [place]   [facility] [address],  [place], [place]   [facility] - Intensive Outpatient [address], [place], [place]   [facility] [place].,  [place], [place] [phone] or [phone]   [facility] (RTS) [address] [place], [place] Accepts Medicaid  [facility] [place] [address],  [place] [phone] Substance Abuse/Addiction Treatment   [facility] Organization         Address  Phone  Notes  [facility]  [phone]   Domenic Schwab, PhD [address] Arlis Porta [place], [place]   [phone] or [phone]   [place] Cantril [place], [place] [phone]   [facility] [address] [place], [place] [phone] Insurance/Medicaid/sponsorship through [facility] and Families [address]                                     [place], [place] [phone] [place] [address], [place] [phone]    Dr. Adele Schilder  [phone]   [facility] Dept. 1) [address], [place] 2) [place] 3)  [place] [address], [place] [phone]  [phone]   [place] [phone] or [phone] (After Hours)

## 2014-12-30 NOTE — ED Provider Notes (Signed)
CSN: [id number]     Arrival date & time 12/30/14  1226 History  By signing my name below, I, Heather Barton, attest that this documentation has been prepared under the direction and in the presence of Heather Barton, [place]. Electronically Signed: Starleen Barton ED Scribe. 12/30/2014. 2:56 PM.    Chief Complaint  Patient presents with  . Foot Injury    blunt object landed on r/foot    The history is provided by the patient. No language interpreter was used.    HPI Comments: Heather Barton is a 34 y.o. female who presents to the Emergency Department complaining of constant, moderate right foot pain which started around 10:30 AM today after her son jumped on her foot. She reports pressure and movement exacerbate her pain. She has tried ice for symptom relief, which has been minimally effective. She reports associated numbness and weakness.       Past Medical History  Diagnosis Date  . Seizures ([place])     last seizure 2 weeks ago  . Stroke ([place])   . Brain tumor (benign) ([place])   . PTSD (post-traumatic stress disorder)   . Bipolar 1 disorder ([place])   . Personality disorder    Past Surgical History  Procedure Laterality Date  . Tubal ligation    . Tumor removal    . Leg surgery     History reviewed. No pertinent family history. Social History  Substance Use Topics  . Smoking status: Current Every Day Smoker -- 0.50 packs/day    Types: Cigarettes  . Smokeless tobacco: Never Used  . Alcohol Use: No   OB History    No data available       Review of Systems  Musculoskeletal: Positive for arthralgias.  Neurological: Positive for weakness and numbness.      Allergies  Sulfa antibiotics; Aripiprazole; Duloxetine hcl; and Vicodin  Home Medications   Prior to Admission medications   Medication Sig Start Date End Date Taking? Authorizing Provider  ARIPiprazole (ABILIFY) 10 MG tablet Take 10 mg by mouth daily.    Historical Provider, MD  clonazePAM (KLONOPIN) 0.5 MG tablet  Take 0.5 mg by mouth 2 (two) times daily as needed for anxiety.    Historical Provider, MD  gabapentin (NEURONTIN) 300 MG capsule Take 600 mg by mouth 3 (three) times daily.     Historical Provider, MD  ibuprofen (ADVIL,MOTRIN) 800 MG tablet Take 1 tablet (800 mg total) by mouth 3 (three) times daily. 12/30/14   Marella Chimes, PA-C  levETIRAcetam (KEPPRA) 1000 MG tablet Take 1,000 mg by mouth 2 (two) times daily. 1000mg  am 2000mg  qpm    Historical Provider, MD  methadone (DOLOPHINE) 10 MG/5ML solution Take 95 mg by mouth daily.     Historical Provider, MD  ondansetron (ZOFRAN ODT) 4 MG disintegrating tablet 4mg  ODT q4 hours prn nausea/vomit 12/20/14   [place] Ferguson, MD  ondansetron (ZOFRAN) 4 MG tablet Take 1 tablet (4 mg total) by mouth every 8 (eight) hours as needed for nausea or vomiting. 12/20/14   Maryellen Pile, MD  pantoprazole (PROTONIX) 40 MG tablet Take 40 mg by mouth daily. 06/22/13   Historical Provider, MD  PE-DM-APAP & Doxylamin-DM-APAP (VICKS DAYQUIL/NYQUIL CLD & FLU) (LIQUID) MISC Take 1 Dose by mouth daily as needed (cold symptoms).    Historical Provider, MD  sertraline (ZOLOFT) 100 MG tablet Take 100 mg by mouth daily.    Historical Provider, MD  topiramate (TOPAMAX) 50 MG tablet Take 100 mg by mouth 2 (  two) times daily. Take 1 tab in the morning and 2 tabs in the evening    Historical Provider, MD  traZODone (DESYREL) 50 MG tablet Take 50 mg by mouth at bedtime.    Historical Provider, MD    BP 101/66 mmHg  Pulse 69  Temp(Src) 97.7 F (36.5 C) (Oral)  Resp 16  SpO2 95%  LMP 09/13/2014 Physical Exam  Constitutional: She is oriented to person, place, and time. She appears well-developed and well-nourished. No distress.  HENT:  Head: Normocephalic and atraumatic.  Right Ear: External ear normal.  Left Ear: External ear normal.  Nose: Nose normal.  Mouth/Throat: Oropharynx is clear and moist.  Eyes: Conjunctivae and EOM are normal. Pupils are equal, round, and  reactive to light. Right eye exhibits no discharge. Left eye exhibits no discharge. No scleral icterus.  Neck: Normal range of motion. Neck supple. No tracheal deviation present.  Cardiovascular: Normal rate and regular rhythm.   Pulmonary/Chest: Effort normal and breath sounds normal. No respiratory distress.  Musculoskeletal: She exhibits tenderness. She exhibits no edema.  Diffuse TTP of right ankle and dorsal aspect of right foot with decreased range of motion of foot due to pain. No significant erythema, edema, or heat. Strength and sensation intact. Distal pulses intact.  Neurological: She is alert and oriented to person, place, and time. She has normal strength. No sensory deficit.  Skin: Skin is warm and dry. She is not diaphoretic.  Psychiatric: She has a normal mood and affect. Her behavior is normal.  Nursing note and vitals reviewed.   ED Course  Procedures (including critical care time)  DIAGNOSTIC STUDIES: Oxygen Saturation is 95% on RA, adequate by my interpretation.    COORDINATION OF CARE: 2:19 PM Will order ibuprofen and imaging of foot.  Patient acknowledges and agrees with plan.    Labs Review Labs Reviewed - No data to display  Imaging Review Dg Ankle Complete Right  12/30/2014  CLINICAL DATA:  Lateral right ankle pain after injury. EXAM: RIGHT ANKLE - COMPLETE 3+ VIEW COMPARISON:  Right tibia/ fibula radiographs from 01/14/2011. FINDINGS: There is no evidence of fracture, dislocation, or joint effusion. There is no evidence of arthropathy or other focal bone abnormality. Soft tissues are unremarkable. IMPRESSION: Negative. Electronically Signed   By: Ilona Sorrel M.D.   On: 12/30/2014 14:42   Dg Foot Complete Right  12/30/2014  CLINICAL DATA:  Lateral right foot and ankle pain after injury. EXAM: RIGHT FOOT COMPLETE - 3+ VIEW COMPARISON:  09/26/2014 right foot radiographs FINDINGS: There is no evidence of fracture or dislocation. There is no evidence of arthropathy  or other focal bone abnormality. Soft tissues are unremarkable. IMPRESSION: Negative. Electronically Signed   By: Ilona Sorrel M.D.   On: 12/30/2014 14:41   I have personally reviewed and evaluated these images and lab results as part of my medical decision-making.   EKG Interpretation None      MDM   Final diagnoses:  Right foot pain  Right ankle pain    34 year old female presents with right ankle and foot pain after her son jumped on her foot earlier this morning. Reports associated numbness and weakness. Patient is afebrile. Vital signs stable. Diffuse TTP of right ankle and dorsal aspect of right foot with decreased range of motion of foot due to pain. No significant erythema, edema, or heat. Strength and sensation intact. Distal pulses intact.  Imaging negative for fracture, dislocation, effusion, focal bone abnormality, soft tissue swelling.  Will  treat with ankle brace, ibuprofen, and RICE therapy. Patient to follow-up with PCP. Return precautions discussed. Patient verbalizes her understanding and is in agreement with plan.  BP 101/66 mmHg  Pulse 69  Temp(Src) 97.7 F (36.5 C) (Oral)  Resp 16  SpO2 95%  LMP 09/13/2014  I personally performed the services described in this documentation, which was scribed in my presence. The recorded information has been reviewed and is accurate.    Marella Chimes, PA-C 12/30/14 [address], MD 12/31/14 [phone]

## 2014-12-30 NOTE — ED Notes (Signed)
C/o pain and swelling  in r/foot x 3 hours. Pt stated that her husband sat on top of her r/foot. Ice applied

## 2015-01-14 ENCOUNTER — Emergency Department (HOSPITAL_COMMUNITY)
Admission: EM | Admit: 2015-01-14 | Discharge: 2015-01-14 | Disposition: A | Discharge status: Home / Self Care | Payer: Medicaid Other | Attending: Emergency Medicine | Admitting: Emergency Medicine

## 2015-01-14 ENCOUNTER — Encounter (HOSPITAL_COMMUNITY): Payer: Self-pay

## 2015-01-14 ENCOUNTER — Emergency Department (HOSPITAL_COMMUNITY): Payer: Medicaid Other

## 2015-01-14 DIAGNOSIS — J029 Acute pharyngitis, unspecified: Secondary | ICD-10-CM | POA: Diagnosis present

## 2015-01-14 DIAGNOSIS — Z3202 Encounter for pregnancy test, result negative: Secondary | ICD-10-CM | POA: Insufficient documentation

## 2015-01-14 DIAGNOSIS — L509 Urticaria, unspecified: Secondary | ICD-10-CM | POA: Diagnosis not present

## 2015-01-14 DIAGNOSIS — R079 Chest pain, unspecified: Secondary | ICD-10-CM | POA: Diagnosis not present

## 2015-01-14 DIAGNOSIS — Z8673 Personal history of transient ischemic attack (TIA), and cerebral infarction without residual deficits: Secondary | ICD-10-CM | POA: Insufficient documentation

## 2015-01-14 DIAGNOSIS — Z86018 Personal history of other benign neoplasm: Secondary | ICD-10-CM | POA: Insufficient documentation

## 2015-01-14 DIAGNOSIS — F319 Bipolar disorder, unspecified: Secondary | ICD-10-CM | POA: Insufficient documentation

## 2015-01-14 DIAGNOSIS — Z79899 Other long term (current) drug therapy: Secondary | ICD-10-CM | POA: Diagnosis not present

## 2015-01-14 DIAGNOSIS — F4310 Post-traumatic stress disorder, unspecified: Secondary | ICD-10-CM | POA: Insufficient documentation

## 2015-01-14 DIAGNOSIS — F17210 Nicotine dependence, cigarettes, uncomplicated: Secondary | ICD-10-CM | POA: Diagnosis not present

## 2015-01-14 LAB — CBC
HEMATOCRIT: 37.8 % (ref 36.0–46.0)
Hemoglobin: 12.8 g/dL (ref 12.0–15.0)
MCH: 29.6 pg (ref 26.0–34.0)
MCHC: 33.9 g/dL (ref 30.0–36.0)
MCV: 87.5 fL (ref 78.0–100.0)
Platelets: 185 10*3/uL (ref 150–400)
RBC: 4.32 MIL/uL (ref 3.87–5.11)
RDW: 13.7 % (ref 11.5–15.5)
WBC: 5.2 10*3/uL (ref 4.0–10.5)

## 2015-01-14 LAB — BASIC METABOLIC PANEL
Anion gap: 7 (ref 5–15)
BUN: 16 mg/dL (ref 6–20)
CHLORIDE: 110 mmol/L (ref 101–111)
CO2: 23 mmol/L (ref 22–32)
Calcium: 9.3 mg/dL (ref 8.9–10.3)
Creatinine, Ser: 0.98 mg/dL (ref 0.44–1.00)
GFR calc non Af Amer: >60 mL/min (ref >60)
Glucose, Bld: 80 mg/dL (ref 65–99)
POTASSIUM: 4 mmol/L (ref 3.5–5.1)
SODIUM: 140 mmol/L (ref 135–145)

## 2015-01-14 LAB — I-STAT BETA HCG BLOOD, ED (MC, WL, AP ONLY)

## 2015-01-14 LAB — I-STAT TROPONIN, ED: Troponin i, poc: 0.01 ng/mL (ref 0.00–0.08)

## 2015-01-14 NOTE — ED Notes (Signed)
Patient transported to X-ray 

## 2015-01-14 NOTE — Discharge Instructions (Signed)
Nonspecific Chest Pain  Chest pain can be caused by many different conditions. There is always a chance that your pain could be related to something serious, such as a heart attack or a blood clot in your lungs. Chest pain can also be caused by conditions that are not life-threatening. If you have chest pain, it is very important to follow up with your health care provider. CAUSES  Chest pain can be caused by:  Heartburn.  Pneumonia or bronchitis.  Anxiety or stress.  Inflammation around your heart (pericarditis) or lung (pleuritis or pleurisy).  A blood clot in your lung.  A collapsed lung (pneumothorax). It can develop suddenly on its own (spontaneous pneumothorax) or from trauma to the chest.  Shingles infection (varicella-zoster virus).  Heart attack.  Damage to the bones, muscles, and cartilage that make up your chest wall. This can include:  Bruised bones due to injury.  Strained muscles or cartilage due to frequent or repeated coughing or overwork.  Fracture to one or more ribs.  Sore cartilage due to inflammation (costochondritis). RISK FACTORS  Risk factors for chest pain may include:  Activities that increase your risk for trauma or injury to your chest.  Respiratory infections or conditions that cause frequent coughing.  Medical conditions or overeating that can cause heartburn.  Heart disease or family history of heart disease.  Conditions or health behaviors that increase your risk of developing a blood clot.  Having had chicken pox (varicella zoster). SIGNS AND SYMPTOMS Chest pain can feel like:  Burning or tingling on the surface of your chest or deep in your chest.  Crushing, pressure, aching, or squeezing pain.  Dull or sharp pain that is worse when you move, cough, or take a deep breath.  Pain that is also felt in your back, neck, shoulder, or arm, or pain that spreads to any of these areas. Your chest pain may come and go, or it may stay  constant. DIAGNOSIS Lab tests or other studies may be needed to find the cause of your pain. Your health care provider may have you take a test called an ambulatory ECG (electrocardiogram). An ECG records your heartbeat patterns at the time the test is performed. You may also have other tests, such as:  Transthoracic echocardiogram (TTE). During echocardiography, sound waves are used to create a picture of all of the heart structures and to look at how blood flows through your heart.  Transesophageal echocardiogram (TEE).This is a more advanced imaging test that obtains images from inside your body. It allows your health care provider to see your heart in finer detail.  Cardiac monitoring. This allows your health care provider to monitor your heart rate and rhythm in real time.  Holter monitor. This is a portable device that records your heartbeat and can help to diagnose abnormal heartbeats. It allows your health care provider to track your heart activity for several days, if needed.  Stress tests. These can be done through exercise or by taking medicine that makes your heart beat more quickly.  Blood tests.  Imaging tests. TREATMENT  Your treatment depends on what is causing your chest pain. Treatment may include:  Medicines. These may include:  Acid blockers for heartburn.  Anti-inflammatory medicine.  Pain medicine for inflammatory conditions.  Antibiotic medicine, if an infection is present.  Medicines to dissolve blood clots.  Medicines to treat coronary artery disease.  Supportive care for conditions that do not require medicines. This may include:  Resting.  Applying heat  or cold packs to injured areas.  Limiting activities until pain decreases. HOME CARE INSTRUCTIONS  If you were prescribed an antibiotic medicine, finish it all even if you start to feel better.  Avoid any activities that bring on chest pain.  Do not use any tobacco products, including  cigarettes, chewing tobacco, or electronic cigarettes. If you need help quitting, ask your health care provider.  Do not drink alcohol.  Take medicines only as directed by your health care provider.  Keep all follow-up visits as directed by your health care provider. This is important. This includes any further testing if your chest pain does not go away.  If heartburn is the cause for your chest pain, you may be told to keep your head raised (elevated) while sleeping. This reduces the chance that acid will go from your stomach into your esophagus.  Make lifestyle changes as directed by your health care provider. These may include:  Getting regular exercise. Ask your health care provider to suggest some activities that are safe for you.  Eating a heart-healthy diet. A registered dietitian can help you to learn healthy eating options.  Maintaining a healthy weight.  Managing diabetes, if necessary.  Reducing stress. SEEK MEDICAL CARE IF:  Your chest pain does not go away after treatment.  You have a rash with blisters on your chest.  You have a fever. SEEK IMMEDIATE MEDICAL CARE IF:   Your chest pain is worse.  You have an increasing cough, or you cough up blood.  You have severe abdominal pain.  You have severe weakness.  You faint.  You have chills.  You have sudden, unexplained chest discomfort.  You have sudden, unexplained discomfort in your arms, back, neck, or jaw.  You have shortness of breath at any time.  You suddenly start to sweat, or your skin gets clammy.  You feel nauseous or you vomit.  You suddenly feel light-headed or dizzy.  Your heart begins to beat quickly, or it feels like it is skipping beats. These symptoms may represent a serious problem that is an emergency. Do not wait to see if the symptoms will go away. Get medical help right away. Call your local emergency services (911 in the [place] Do not drive yourself to the hospital.   This  information is not intended to replace advice given to you by your health care provider. Make sure you discuss any questions you have with your health care provider.   Document Released: 11/19/2004 Document Revised: 03/02/2014 Document Reviewed: 09/15/2013 Elsevier Interactive Patient Education 2016 Elsevier Inc[employer] Pharyngitis Pharyngitis is redness, pain, and swelling (inflammation) of your pharynx.  CAUSES  Pharyngitis is usually caused by infection. Most of the time, these infections are from viruses (viral) and are part of a cold. However, sometimes pharyngitis is caused by bacteria (bacterial). Pharyngitis can also be caused by allergies. Viral pharyngitis may be spread from person to person by coughing, sneezing, and personal items or utensils (cups, forks, spoons, toothbrushes). Bacterial pharyngitis may be spread from person to person by more intimate contact, such as kissing.  SIGNS AND SYMPTOMS  Symptoms of pharyngitis include:   Sore throat.   Tiredness (fatigue).   Low-grade fever.   Headache.  Joint pain and muscle aches.  Skin rashes.  Swollen lymph nodes.  Plaque-like film on throat or tonsils (often seen with bacterial pharyngitis). DIAGNOSIS  Your health care provider will ask you questions about your illness and your symptoms. Your medical history, along with a physical  exam, is often all that is needed to diagnose pharyngitis. Sometimes, a rapid strep test is done. Other lab tests may also be done, depending on the suspected cause.  TREATMENT  Viral pharyngitis will usually get better in 3-4 days without the use of medicine. Bacterial pharyngitis is treated with medicines that kill germs (antibiotics).  HOME CARE INSTRUCTIONS   Drink enough water and fluids to keep your urine clear or pale yellow.   Only take over-the-counter or prescription medicines as directed by your health care provider:   If you are prescribed antibiotics, make sure you finish them  even if you start to feel better.   Do not take aspirin.   Get lots of rest.   Gargle with 8 oz of salt water ( tsp of salt per 1 qt of water) as often as every 1-2 hours to soothe your throat.   Throat lozenges (if you are not at risk for choking) or sprays may be used to soothe your throat. SEEK MEDICAL CARE IF:   You have large, tender lumps in your neck.  You have a rash.  You cough up green, yellow-brown, or bloody spit. SEEK IMMEDIATE MEDICAL CARE IF:   Your neck becomes stiff.  You drool or are unable to swallow liquids.  You vomit or are unable to keep medicines or liquids down.  You have severe pain that does not go away with the use of recommended medicines.  You have trouble breathing (not caused by a stuffy nose). MAKE SURE YOU:   Understand these instructions.  Will watch your condition.  Will get help right away if you are not doing well or get worse.   This information is not intended to replace advice given to you by your health care provider. Make sure you discuss any questions you have with your health care provider.   Document Released: 02/09/2005 Document Revised: 11/30/2012 Document Reviewed: 10/17/2012 Elsevier Interactive Patient Education [employer].

## 2015-01-14 NOTE — ED Notes (Signed)
Pt c/o intermittent, sharp, central chest pain, SOB, sore throat, and hives x 1 week.  Pain score 7/10.  Pt reports taking Benadryl w/o relief.  No rash or hives noted.

## 2015-01-14 NOTE — Progress Notes (Addendum)
34 yr old [place] access pt Cm noted a pediatrician listed for pcp Cm called [phone] and spoke with staff there who confirm pt has note been seen and would not be seen at this pediatrician office Noted to be tearful as Cm entered her room with EDP attempting to assist her with her IV side bandage CM offered to assist but informed that pt was "just upset"  When EDP left room pt states she "can't get what I need here." Pt began to pull off her gown without curtains closed. Female visitor left room as soon as CM entered pt room Cm reviewed with pt that her pcp assigned by [facility] was incorrectly listed as a pediatrician office that is not accepting new pts Cm discussed with pt the need to call or go to local DSS to have them to assist her with a new medicaid [place] access doctor  Pt states she has a Doctor Dr Rosine Door" that she sees "every 4 weeks" Cm discussed with her that she would need to have this dr placed on her medicaid card to prevent further billing to her since he was not assigned by [facility] access. Pt interrupted Cm x 2 stating ED would not give her what she wanted.  Discussed with her that [facility] EDPs are NOT pcps to provide prescriptions on an ongoing basis.  Discussed the pcp would be able to assist with this CM inquired if she was on a contract with the dr she is seeing "every four weeks" and she stated "Yes"  Cm discussed that the EDP would not provide medication other than what she is contracted to receive.  Cm encouraged her to get DSS to help her correct her pcp and she would be able to have f/u care Pt given a list of medicaid doctors in [place] to also assist her to get assigned a provider Pt requesting ED RN to come to d/c her "or I'm walking out"  CM discussed leaving AMA is not advised and explained why.  Pt agreed to await ED RN d/c process Female visitor returned to pt room when she was completely dressed Cm inquired if he was ok and  he stated "yes" as he stood near the wall  Pt seen as she was leaving [facility] ED Pt no longer tearful Pt had used ED telephone to call DSS to set up per pt an appt for January 24 2015 to get assistance Female visitor thanked CM for assistance  Bernita Raisin, MD not in [facility] for CM to send an in basket message

## 2015-01-15 NOTE — ED Provider Notes (Addendum)
CSN: [id number]     Arrival date & time 01/14/15  1036 History   First MD Initiated Contact with Patient 01/14/15 1111     Chief Complaint  Patient presents with  . Chest Pain  . Sore Throat  . Urticaria      HPI Patient presents the emergency department complaining of itching of her back and chest.  She also reports sharp intermittent chest pain.  She reports symptoms of the sharp pain comes on it makes her feel short of breath.  Last for a few seconds.  She reports her pain is moderate to severe in severity and 7 out of 10.  She tried Benadryl without improvement in her symptoms.  No history of eczema.  No rash noted.  No new contacts, lotions, perfumes, detergents.  No prior history of cardiac disease.  No history of DVT or PE.  No recent surgery or long travel.  Denies unilateral leg swelling.   Past Medical History  Diagnosis Date  . Seizures ([place])     last seizure 2 weeks ago  . Stroke ([place])   . Brain tumor (benign) ([place])   . PTSD (post-traumatic stress disorder)   . Bipolar 1 disorder ([place])   . Personality disorder    Past Surgical History  Procedure Laterality Date  . Tubal ligation    . Tumor removal    . Leg surgery     History reviewed. No pertinent family history. Social History  Substance Use Topics  . Smoking status: Current Every Day Smoker -- 0.50 packs/day    Types: Cigarettes  . Smokeless tobacco: Never Used  . Alcohol Use: No   OB History    No data available     Review of Systems  All other systems reviewed and are negative.     Allergies  Sulfa antibiotics; Aripiprazole; Duloxetine hcl; and Vicodin  Home Medications   Prior to Admission medications   Medication Sig Start Date End Date Taking? Authorizing Provider  ARIPiprazole (ABILIFY) 15 MG tablet Take 15 mg by mouth daily.   Yes Historical Provider, MD  clonazePAM (KLONOPIN) 0.5 MG tablet Take 0.5 mg by mouth 2 (two) times daily as needed for anxiety.   Yes Historical Provider, MD   gabapentin (NEURONTIN) 300 MG capsule Take 600 mg by mouth 3 (three) times daily.    Yes Historical Provider, MD  ibuprofen (ADVIL,MOTRIN) 800 MG tablet Take 1 tablet (800 mg total) by mouth 3 (three) times daily. Patient taking differently: Take 800 mg by mouth every 8 (eight) hours as needed for fever or moderate pain.  12/30/14  Yes Marella Chimes, PA-C  levETIRAcetam (KEPPRA) 1000 MG tablet Take 2,000 mg by mouth 2 (two) times daily.    Yes Historical Provider, MD  methadone (DOLOPHINE) 10 MG/5ML solution Take 105 mg by mouth daily.    Yes Historical Provider, MD  ondansetron (ZOFRAN ODT) 4 MG disintegrating tablet 4mg  ODT q4 hours prn nausea/vomit Patient taking differently: Take 4 mg by mouth every 4 (four) hours as needed for nausea or vomiting.  12/20/14  Yes Milton Ferguson, MD  pantoprazole (PROTONIX) 40 MG tablet Take 40 mg by mouth daily. 06/22/13  Yes Historical Provider, MD  sertraline (ZOLOFT) 100 MG tablet Take 100 mg by mouth daily.   Yes Historical Provider, MD  topiramate (TOPAMAX) 50 MG tablet Take 100 mg by mouth 2 (two) times daily.    Yes Historical Provider, MD  traZODone (DESYREL) 50 MG tablet Take 50 mg by mouth  at bedtime.   Yes Historical Provider, MD  ondansetron (ZOFRAN) 4 MG tablet Take 1 tablet (4 mg total) by mouth every 8 (eight) hours as needed for nausea or vomiting. Patient not taking: Reported on 01/14/2015 12/20/14   Maryellen Pile, MD   BP 104/72 mmHg  Pulse 81  Temp(Src) 97.6 F (36.4 C) (Oral)  Resp 16  SpO2 97%  LMP 01/04/2015 Physical Exam  Constitutional: She is oriented to person, place, and time. She appears well-developed and well-nourished. No distress.  HENT:  Head: Normocephalic and atraumatic.  Uvula midline.  Mild erythema of her posterior pharynx.  No tonsillar swelling or exudate.  Tolerating secretions.  Oral airway patent.  Eyes: EOM are normal.  Neck: Normal range of motion.  Cardiovascular: Normal rate, regular rhythm and  normal heart sounds.   Pulmonary/Chest: Effort normal and breath sounds normal.  Abdominal: Soft. She exhibits no distension. There is no tenderness.  Musculoskeletal: Normal range of motion.  Neurological: She is alert and oriented to person, place, and time.  Skin: Skin is warm and dry. No rash noted. No erythema.  Psychiatric: She has a normal mood and affect. Judgment normal.  Nursing note and vitals reviewed.   ED Course  Procedures (including critical care time) Labs Review Labs Reviewed  BASIC METABOLIC PANEL  CBC  I-STAT [place], ED  I-STAT BETA HCG BLOOD, ED ([facility], [facility], [facility] ONLY)    Imaging Review Dg Chest 2 View  01/14/2015  CLINICAL DATA:  Chest pain EXAM: CHEST - 2 VIEW COMPARISON:  08/17/2009 FINDINGS: The heart size and mediastinal contours are within normal limits. Both lungs are clear. The visualized skeletal structures are unremarkable. IMPRESSION: No active disease. Electronically Signed   By: Inez Catalina M.D.   On: 01/14/2015 11:47   I have personally reviewed and evaluated these images and lab results as part of my medical decision-making.   EKG Interpretation   Date/Time:  Monday January 14 2015 10:43:26 EST Ventricular Rate:  75 PR Interval:  145 QRS Duration: 77 QT Interval:  383 QTC Calculation: 428 R Axis:   62 Text Interpretation:  Sinus rhythm No old tracing to compare Confirmed by  Sumaya Riedesel  MD, Lennette Bihari ([id number]) on 01/14/2015 12:22:51 PM      MDM   Final diagnoses:  Chest pain, unspecified chest pain type  Pharyngitis    Patient is overall well-appearing.  Doubt PE.  Doubt ACS.  EKG without ischemic changes.  Troponin negative.  Unclear etiology of her pruritus.  Recommended cooler showers and lotion as this may represent dry irritated skin from showers.  No indication for additional workup in emergency department.  Medical screening examination completed.  Patient does report mild sore throat.  Her throat appears consistent with viral  pharyngitis.  No significant exudate.  Tolerating secretions.  Uvula midline.  No peritonsillar abscess.    Jola Schmidt, MD 01/15/15 [place], MD 01/15/15 [phone]

## 2015-01-21 ENCOUNTER — Emergency Department (HOSPITAL_COMMUNITY)
Admission: EM | Admit: 2015-01-21 | Discharge: 2015-01-22 | Discharge status: Against Medical Advice | Payer: Medicaid Other | Attending: Emergency Medicine | Admitting: Emergency Medicine

## 2015-01-21 ENCOUNTER — Encounter (HOSPITAL_COMMUNITY): Payer: Self-pay | Admitting: Emergency Medicine

## 2015-01-21 DIAGNOSIS — S4991XA Unspecified injury of right shoulder and upper arm, initial encounter: Secondary | ICD-10-CM | POA: Diagnosis not present

## 2015-01-21 DIAGNOSIS — Y998 Other external cause status: Secondary | ICD-10-CM | POA: Insufficient documentation

## 2015-01-21 DIAGNOSIS — Y9289 Other specified places as the place of occurrence of the external cause: Secondary | ICD-10-CM | POA: Diagnosis not present

## 2015-01-21 DIAGNOSIS — Y9389 Activity, other specified: Secondary | ICD-10-CM | POA: Insufficient documentation

## 2015-01-21 DIAGNOSIS — W108XXA Fall (on) (from) other stairs and steps, initial encounter: Secondary | ICD-10-CM | POA: Diagnosis not present

## 2015-01-21 NOTE — ED Notes (Signed)
Patient reports falling down approximately 6 steps, denies LOC, denies hitting head, c/o right shoulder pain radiating down mid back. Patient reports "And there's something in my stomach". Patient reports LMP x3 months. States "Everytime I take a pregnancy test, it's negative, but something is kicking me and it kicks my boyfriend".

## 2015-01-22 NOTE — ED Notes (Signed)
Patient states "Can you take me out the system, I'm going to [facility]".

## 2015-02-26 ENCOUNTER — Emergency Department (HOSPITAL_COMMUNITY)
Admission: EM | Admit: 2015-02-26 | Discharge: 2015-02-26 | Disposition: A | Discharge status: Home / Self Care | Payer: Medicaid Other | Attending: Emergency Medicine | Admitting: Emergency Medicine

## 2015-02-26 ENCOUNTER — Emergency Department (HOSPITAL_COMMUNITY): Payer: Medicaid Other

## 2015-02-26 ENCOUNTER — Encounter (HOSPITAL_COMMUNITY): Payer: Self-pay

## 2015-02-26 DIAGNOSIS — Z791 Long term (current) use of non-steroidal anti-inflammatories (NSAID): Secondary | ICD-10-CM | POA: Diagnosis not present

## 2015-02-26 DIAGNOSIS — R05 Cough: Secondary | ICD-10-CM | POA: Diagnosis not present

## 2015-02-26 DIAGNOSIS — R300 Dysuria: Secondary | ICD-10-CM | POA: Diagnosis not present

## 2015-02-26 DIAGNOSIS — Z8673 Personal history of transient ischemic attack (TIA), and cerebral infarction without residual deficits: Secondary | ICD-10-CM | POA: Diagnosis not present

## 2015-02-26 DIAGNOSIS — Z3202 Encounter for pregnancy test, result negative: Secondary | ICD-10-CM | POA: Insufficient documentation

## 2015-02-26 DIAGNOSIS — F319 Bipolar disorder, unspecified: Secondary | ICD-10-CM | POA: Diagnosis not present

## 2015-02-26 DIAGNOSIS — R1030 Lower abdominal pain, unspecified: Secondary | ICD-10-CM | POA: Diagnosis not present

## 2015-02-26 DIAGNOSIS — Z79899 Other long term (current) drug therapy: Secondary | ICD-10-CM | POA: Diagnosis not present

## 2015-02-26 DIAGNOSIS — R059 Cough, unspecified: Secondary | ICD-10-CM

## 2015-02-26 DIAGNOSIS — F17210 Nicotine dependence, cigarettes, uncomplicated: Secondary | ICD-10-CM | POA: Diagnosis not present

## 2015-02-26 DIAGNOSIS — R110 Nausea: Secondary | ICD-10-CM | POA: Diagnosis not present

## 2015-02-26 DIAGNOSIS — R1033 Periumbilical pain: Secondary | ICD-10-CM | POA: Diagnosis present

## 2015-02-26 DIAGNOSIS — N898 Other specified noninflammatory disorders of vagina: Secondary | ICD-10-CM | POA: Diagnosis not present

## 2015-02-26 DIAGNOSIS — Z79891 Long term (current) use of opiate analgesic: Secondary | ICD-10-CM | POA: Insufficient documentation

## 2015-02-26 DIAGNOSIS — Z86011 Personal history of benign neoplasm of the brain: Secondary | ICD-10-CM | POA: Diagnosis not present

## 2015-02-26 DIAGNOSIS — F4310 Post-traumatic stress disorder, unspecified: Secondary | ICD-10-CM | POA: Insufficient documentation

## 2015-02-26 DIAGNOSIS — R102 Pelvic and perineal pain: Secondary | ICD-10-CM

## 2015-02-26 DIAGNOSIS — R350 Frequency of micturition: Secondary | ICD-10-CM | POA: Diagnosis not present

## 2015-02-26 LAB — CBC WITH DIFFERENTIAL/PLATELET
BASOS ABS: 0 10*3/uL (ref 0.0–0.1)
BASOS PCT: 0 %
Eosinophils Absolute: 0 10*3/uL (ref 0.0–0.7)
Eosinophils Relative: 0 %
HEMATOCRIT: 39.9 % (ref 36.0–46.0)
HEMOGLOBIN: 13.2 g/dL (ref 12.0–15.0)
LYMPHS PCT: 25 %
Lymphs Abs: 1.1 10*3/uL (ref 0.7–4.0)
MCH: 28 pg (ref 26.0–34.0)
MCHC: 33.1 g/dL (ref 30.0–36.0)
MCV: 84.5 fL (ref 78.0–100.0)
Monocytes Absolute: 0.2 10*3/uL (ref 0.1–1.0)
Monocytes Relative: 5 %
NEUTROS ABS: 3 10*3/uL (ref 1.7–7.7)
NEUTROS PCT: 70 %
Platelets: 233 10*3/uL (ref 150–400)
RBC: 4.72 MIL/uL (ref 3.87–5.11)
RDW: 13.9 % (ref 11.5–15.5)
WBC: 4.2 10*3/uL (ref 4.0–10.5)

## 2015-02-26 LAB — LIPASE, BLOOD: Lipase: 30 U/L (ref 11–51)

## 2015-02-26 LAB — BASIC METABOLIC PANEL
ANION GAP: 9 (ref 5–15)
BUN: 11 mg/dL (ref 6–20)
CALCIUM: 9.1 mg/dL (ref 8.9–10.3)
CHLORIDE: 109 mmol/L (ref 101–111)
CO2: 19 mmol/L — AB (ref 22–32)
Creatinine, Ser: 1.28 mg/dL — ABNORMAL HIGH (ref 0.44–1.00)
GFR calc non Af Amer: 53 mL/min — ABNORMAL LOW (ref >60)
Glucose, Bld: 115 mg/dL — ABNORMAL HIGH (ref 65–99)
Potassium: 3.5 mmol/L (ref 3.5–5.1)
Sodium: 137 mmol/L (ref 135–145)

## 2015-02-26 LAB — GC/CHLAMYDIA PROBE AMP (~~LOC~~) NOT AT ARMC
Chlamydia: NEGATIVE
Neisseria Gonorrhea: NEGATIVE

## 2015-02-26 LAB — WET PREP, GENITAL
Clue Cells Wet Prep HPF POC: NONE SEEN
SPERM: NONE SEEN
Trich, Wet Prep: NONE SEEN
WBC, Wet Prep HPF POC: NONE SEEN
YEAST WET PREP: NONE SEEN

## 2015-02-26 LAB — I-STAT BETA HCG BLOOD, ED (MC, WL, AP ONLY)

## 2015-02-26 MED ORDER — BENZONATATE 100 MG PO CAPS: 100.0000 mg | ORAL_CAPSULE | Freq: Three times a day (TID) | ORAL | Status: DC

## 2015-02-26 NOTE — Discharge Instructions (Signed)
Please read and follow all provided instructions.  Your diagnoses today include:  1. Periumbilical abdominal pain   2. Pelvic pain in female   3. Cough     Tests performed today include:  Blood counts and electrolytes  Blood tests to check liver and kidney function  Blood tests to check pancreas function  Urine test to look for infection and pregnancy (in women)  Ultrasound - no signs of problems from miscarriage  Wet prep - no vaginal infections  Vital signs. See below for your results today.   Medications prescribed:   Tessalon Perles - cough suppressant medication  Take any prescribed medications only as directed.  Home care instructions:   Follow any educational materials contained in this packet.  Follow-up instructions: Please follow-up with your primary care provider in the next 2 days for further evaluation of your symptoms.    Return instructions:  SEEK IMMEDIATE MEDICAL ATTENTION IF:  The pain does not go away or becomes severe   A temperature above 101F develops   Repeated vomiting occurs (multiple episodes)   The pain becomes localized to portions of the abdomen. The right side could possibly be appendicitis. In an adult, the left lower portion of the abdomen could be colitis or diverticulitis.   Blood is being passed in stools or vomit (bright red or black tarry stools)   You develop chest pain, difficulty breathing, dizziness or fainting, or become confused, poorly responsive, or inconsolable (young children)  If you have any other emergent concerns regarding your health  Additional Information: Abdominal (belly) pain can be caused by many things. Your caregiver performed an examination and possibly ordered blood/urine tests and imaging (CT scan, x-rays, ultrasound). Many cases can be observed and treated at home after initial evaluation in the emergency department. Even though you are being discharged home, abdominal pain can be unpredictable.  Therefore, you need a repeated exam if your pain does not resolve, returns, or worsens. Most patients with abdominal pain don't have to be admitted to the hospital or have surgery, but serious problems like appendicitis and gallbladder attacks can start out as nonspecific pain. Many abdominal conditions cannot be diagnosed in one visit, so follow-up evaluations are very important.  Your vital signs today were: BP 114/81 mmHg   Pulse 64   Temp(Src) 97.6 F (36.4 C) (Oral)   Resp 18   Ht 5\' 6"  (1.676 m)   Wt 73.936 kg   BMI 26.32 kg/m2   SpO2 98%   LMP 11/05/2014 If your blood pressure (bp) was elevated above 135/85 this visit, please have this repeated by your doctor within one month. --------------

## 2015-02-26 NOTE — ED Provider Notes (Signed)
CSN: [id number]     Arrival date & time 02/26/15  [id number] History   First MD Initiated Contact with Patient 02/26/15 [phone]     Chief Complaint  Patient presents with  . Abdominal Pain     (Consider location/radiation/quality/duration/timing/severity/associated sxs/prior Treatment) HPI Comments: Patient with no past surgical history presents with c/o periumbilical pain , waxing and waning since a miscarriage approximately one week ago. Patient states that she was approximally [redacted] weeks pregnant and awoke with blood and birth products in her bed. She denies fever, vomiting, diarrhea. Patient does report dysuria with urgency and frequency. No current vaginal bleeding or discharge. No treatments PTA. The onset of this condition was acute. The course is constant. Aggravating factors: none. Alleviating factors: none.    The history is provided by the patient.    Past Medical History  Diagnosis Date  . Seizures ([place])     last seizure 2 weeks ago  . Stroke ([place])   . Brain tumor (benign) ([place])   . PTSD (post-traumatic stress disorder)   . Bipolar 1 disorder ([place])   . Personality disorder    Past Surgical History  Procedure Laterality Date  . Tubal ligation    . Tumor removal    . Leg surgery     History reviewed. No pertinent family history. Social History  Substance Use Topics  . Smoking status: Current Every Day Smoker -- 0.50 packs/day    Types: Cigarettes  . Smokeless tobacco: Never Used  . Alcohol Use: No   OB History    Gravida Para Term Preterm AB TAB SAB Ectopic Multiple Living   6 2   4  4   2      Review of Systems  Constitutional: Negative for fever.  HENT: Negative for rhinorrhea and sore throat.   Eyes: Negative for redness.  Respiratory: Positive for cough.   Cardiovascular: Negative for chest pain.  Gastrointestinal: Positive for nausea and abdominal pain. Negative for vomiting and diarrhea.  Genitourinary: Negative for dysuria, vaginal bleeding and vaginal  discharge.  Musculoskeletal: Negative for myalgias.  Skin: Negative for rash.  Neurological: Negative for headaches.    Allergies  Sulfa antibiotics; Aripiprazole; Duloxetine hcl; and Vicodin  Home Medications   Prior to Admission medications   Medication Sig Start Date End Date Taking? Authorizing Provider  ARIPiprazole (ABILIFY) 15 MG tablet Take 15 mg by mouth daily.    Historical Provider, MD  clonazePAM (KLONOPIN) 0.5 MG tablet Take 0.5 mg by mouth 2 (two) times daily as needed for anxiety.    Historical Provider, MD  gabapentin (NEURONTIN) 300 MG capsule Take 600 mg by mouth 3 (three) times daily.     Historical Provider, MD  ibuprofen (ADVIL,MOTRIN) 800 MG tablet Take 1 tablet (800 mg total) by mouth 3 (three) times daily. Patient taking differently: Take 800 mg by mouth every 8 (eight) hours as needed for fever or moderate pain.  12/30/14   Marella Chimes, PA-C  levETIRAcetam (KEPPRA) 1000 MG tablet Take 2,000 mg by mouth 2 (two) times daily.     Historical Provider, MD  methadone (DOLOPHINE) 10 MG/5ML solution Take 105 mg by mouth daily.     Historical Provider, MD  ondansetron (ZOFRAN ODT) 4 MG disintegrating tablet 4mg  ODT q4 hours prn nausea/vomit Patient taking differently: Take 4 mg by mouth every 4 (four) hours as needed for nausea or vomiting.  12/20/14   Milton Ferguson, MD  ondansetron (ZOFRAN) 4 MG tablet Take 1 tablet (4 mg total) by  mouth every 8 (eight) hours as needed for nausea or vomiting. Patient not taking: Reported on 01/14/2015 12/20/14   Maryellen Pile, MD  pantoprazole (PROTONIX) 40 MG tablet Take 40 mg by mouth daily. 06/22/13   Historical Provider, MD  sertraline (ZOLOFT) 100 MG tablet Take 100 mg by mouth daily.    Historical Provider, MD  topiramate (TOPAMAX) 50 MG tablet Take 100 mg by mouth 2 (two) times daily.     Historical Provider, MD  traZODone (DESYREL) 50 MG tablet Take 50 mg by mouth at bedtime.    Historical Provider, MD   BP 128/86 mmHg   Pulse 80  Temp(Src) 97.6 F (36.4 C) (Oral)  Resp 18  Ht 5\' 6"  (1.676 m)  Wt 73.936 kg  BMI 26.32 kg/m2  SpO2 100%  LMP 11/05/2014   Physical Exam  Constitutional: She appears well-developed and well-nourished.  HENT:  Head: Normocephalic and atraumatic.  Eyes: Conjunctivae are normal. Right eye exhibits no discharge. Left eye exhibits no discharge.  Neck: Normal range of motion. Neck supple.  Cardiovascular: Normal rate and regular rhythm.   No murmur heard. Pulmonary/Chest: Breath sounds normal. No respiratory distress. She has no wheezes. She has no rales.  Abdominal: Soft. She exhibits no distension. There is tenderness (Mild periumbilical, also suprapubic to a lesser extent). There is no rebound and no guarding.  Genitourinary: There is no lesion on the right labia. There is no lesion on the left labia. Uterus is not tender. Cervix exhibits discharge (clear, non-purulent, non-bloody). Cervix exhibits no motion tenderness and no friability. Right adnexum displays no mass and no tenderness. Left adnexum displays tenderness. Left adnexum displays no mass. No tenderness or bleeding in the vagina. No signs of injury around the vagina. Vaginal discharge (white, clear) found.    Neurological: She is alert.  Skin: Skin is warm and dry.  Psychiatric: She has a normal mood and affect.  Nursing note and vitals reviewed.   ED Course  Procedures (including critical care time) Labs Review Labs Reviewed  BASIC METABOLIC PANEL - Abnormal; Notable for the following:    CO2 19 (*)    Glucose, Bld 115 (*)    Creatinine, Ser 1.28 (*)    GFR calc non Af Amer 53 (*)    All other components within normal limits  WET PREP, GENITAL  CBC WITH DIFFERENTIAL/PLATELET  LIPASE, BLOOD  I-STAT BETA HCG BLOOD, ED ([facility], [facility], [facility] ONLY)  GC/CHLAMYDIA PROBE AMP ([place]) NOT AT [facility]    Imaging Review US Transvaginal Non-ob  02/26/2015  CLINICAL DATA:  Pelvic pain, midline and toward the left. Patient  reports recent spontaneous abortion EXAM: TRANSABDOMINAL AND TRANSVAGINAL ULTRASOUND OF PELVIS TECHNIQUE: Study was performed transabdominally to optimize pelvic field of view evaluation and transvaginally to optimize internal visceral architecture evaluation. COMPARISON:  October 15, 2008 FINDINGS: Uterus Measurements: 7.9 x 4.1 x 4.5 cm. Uterus is retroverted. No fibroids or other mass visualized. Endometrium Thickness: 14 mm. No focal abnormality visualized. No evidence suggesting retained products of conception. Contour of the endometrium is smooth. Right ovary Measurements: 3.7 x 2.4 x 1.4 cm. Normal appearance/no adnexal mass. Left ovary Measurements: 3.0 x 2.6 x 3.9 cm. Normal appearance/no adnexal mass. Dominant follicle measures 1.8 x 1.5 x 1.9 cm. Other findings No abnormal free fluid. IMPRESSION: Uterus retroverted. No intrauterine mass. No extrauterine mass beyond physiologic ovarian follicles. No free pelvic fluid. No inflammatory focus. Endometrium appears unremarkable. Electronically Signed   By: Lowella Grip III M.D.  On: 02/26/2015 10:35   US Pelvis Complete  02/26/2015  CLINICAL DATA:  Pelvic pain, midline and toward the left. Patient reports recent spontaneous abortion EXAM: TRANSABDOMINAL AND TRANSVAGINAL ULTRASOUND OF PELVIS TECHNIQUE: Study was performed transabdominally to optimize pelvic field of view evaluation and transvaginally to optimize internal visceral architecture evaluation. COMPARISON:  October 15, 2008 FINDINGS: Uterus Measurements: 7.9 x 4.1 x 4.5 cm. Uterus is retroverted. No fibroids or other mass visualized. Endometrium Thickness: 14 mm. No focal abnormality visualized. No evidence suggesting retained products of conception. Contour of the endometrium is smooth. Right ovary Measurements: 3.7 x 2.4 x 1.4 cm. Normal appearance/no adnexal mass. Left ovary Measurements: 3.0 x 2.6 x 3.9 cm. Normal appearance/no adnexal mass. Dominant follicle measures 1.8 x 1.5 x 1.9 cm.  Other findings No abnormal free fluid. IMPRESSION: Uterus retroverted. No intrauterine mass. No extrauterine mass beyond physiologic ovarian follicles. No free pelvic fluid. No inflammatory focus. Endometrium appears unremarkable. Electronically Signed   By: Lowella Grip III M.D.   On: 02/26/2015 10:35   I have personally reviewed and evaluated these images and lab results as part of my medical decision-making.   EKG Interpretation None       8:04 AM Patient seen and examined. Work-up initiated. Medications ordered.   Vital signs reviewed and are as follows: BP 128/86 mmHg  Pulse 80  Temp(Src) 97.6 F (36.4 C) (Oral)  Resp 18  Ht 5\' 6"  (1.676 m)  Wt 73.936 kg  BMI 26.32 kg/m2  SpO2 100%  LMP 11/05/2014  9:20 AM Pelvic performed with NT chaperone Yvetta Coder). [place] ordered.   11:33 AM ultrasound unremarkable. Labs unremarkable. No sign of UTI. Patient updated on results.  Will discharge to home with Tylenol/ibuprofen for symptomatic control. Patient requests something for cough, Tessalon written.  he patient was urged to return to the Emergency Department immediately with worsening of current symptoms, worsening abdominal pain, persistent vomiting, blood noted in stools, fever, or any other concerns. The patient verbalized understanding.    MDM   Final diagnoses:  Periumbilical abdominal pain  Cough   Patient with abdominal pain, reported miscarriage. It is unclear to me whether patient was actually pregnant, regardless ultrasound does not show any retained products of conception or other problems in the pelvis. Patient has pain higher in the periumbilical area which is minor. Lab work is reassuring and I do not feel that CT imaging is indicated at this time. Return in follow-up instructions as above. Vitals are stable, no fever. No signs of dehydration, tolerating PO's. Lungs are clear and I doubt PNA. No hypoxia or fever. No focal abdominal pain concerning for appendicitis,  cholecystitis, pancreatitis, ruptured viscus, UTI, kidney stone, or any other emergent abdominal etiology. Supportive therapy indicated with return if symptoms worsen. Patient counseled.      Carlisle Cater, PA-C 02/26/15 [place], MD 03/01/15 [phone]

## 2015-02-26 NOTE — ED Notes (Signed)
Pt reports she had a miscarriage at home aprox. 1.5 weeks ago.  Pt was aprox. [redacted] weeks pregnant and was not seen by a medical professional after miscarriage.  Pt reports continued mid abdominal pain with nausea.

## 2015-02-26 NOTE — ED Notes (Signed)
Patient transported to Ultrasound 

## 2015-03-07 ENCOUNTER — Encounter (HOSPITAL_COMMUNITY): Payer: Self-pay | Admitting: Nurse Practitioner

## 2015-03-07 ENCOUNTER — Emergency Department (HOSPITAL_COMMUNITY)
Admission: EM | Admit: 2015-03-07 | Discharge: 2015-03-07 | Discharge status: Against Medical Advice | Payer: Medicaid Other | Attending: Emergency Medicine | Admitting: Emergency Medicine

## 2015-03-07 DIAGNOSIS — F17210 Nicotine dependence, cigarettes, uncomplicated: Secondary | ICD-10-CM | POA: Insufficient documentation

## 2015-03-07 DIAGNOSIS — R51 Headache: Secondary | ICD-10-CM | POA: Insufficient documentation

## 2015-03-07 DIAGNOSIS — R112 Nausea with vomiting, unspecified: Secondary | ICD-10-CM | POA: Diagnosis not present

## 2015-03-07 DIAGNOSIS — Z3202 Encounter for pregnancy test, result negative: Secondary | ICD-10-CM | POA: Insufficient documentation

## 2015-03-07 DIAGNOSIS — R3911 Hesitancy of micturition: Secondary | ICD-10-CM | POA: Diagnosis not present

## 2015-03-07 DIAGNOSIS — R509 Fever, unspecified: Secondary | ICD-10-CM | POA: Insufficient documentation

## 2015-03-07 LAB — COMPREHENSIVE METABOLIC PANEL
ALBUMIN: 4.4 g/dL (ref 3.5–5.0)
ALT: 13 U/L — AB (ref 14–54)
AST: 17 U/L (ref 15–41)
Alkaline Phosphatase: 64 U/L (ref 38–126)
Anion gap: 14 (ref 5–15)
BUN: 16 mg/dL (ref 6–20)
CHLORIDE: 108 mmol/L (ref 101–111)
CO2: 16 mmol/L — AB (ref 22–32)
CREATININE: 0.92 mg/dL (ref 0.44–1.00)
Calcium: 9.2 mg/dL (ref 8.9–10.3)
GFR calc Af Amer: >60 mL/min (ref >60)
GFR calc non Af Amer: >60 mL/min (ref >60)
Glucose, Bld: 84 mg/dL (ref 65–99)
POTASSIUM: 4.1 mmol/L (ref 3.5–5.1)
SODIUM: 138 mmol/L (ref 135–145)
Total Bilirubin: 0.6 mg/dL (ref 0.3–1.2)
Total Protein: 7.4 g/dL (ref 6.5–8.1)

## 2015-03-07 LAB — CBC
HEMATOCRIT: 42.9 % (ref 36.0–46.0)
Hemoglobin: 14.6 g/dL (ref 12.0–15.0)
MCH: 28.8 pg (ref 26.0–34.0)
MCHC: 34 g/dL (ref 30.0–36.0)
MCV: 84.6 fL (ref 78.0–100.0)
PLATELETS: 223 10*3/uL (ref 150–400)
RBC: 5.07 MIL/uL (ref 3.87–5.11)
RDW: 14.1 % (ref 11.5–15.5)
WBC: 6.5 10*3/uL (ref 4.0–10.5)

## 2015-03-07 LAB — I-STAT BETA HCG BLOOD, ED (MC, WL, AP ONLY): I-stat hCG, quantitative: <5 m[IU]/mL (ref <5)

## 2015-03-07 NOTE — ED Notes (Signed)
Patient called 3X with no response for room

## 2015-03-07 NOTE — ED Notes (Signed)
Pt reports chills, fevers, n/v, headaches, urinary hesitancy x 2 days. She is a&Ox4, resp e/u

## 2015-04-14 ENCOUNTER — Emergency Department (HOSPITAL_COMMUNITY)
Admission: EM | Admit: 2015-04-14 | Discharge: 2015-04-14 | Disposition: A | Discharge status: Home / Self Care | Payer: Medicaid Other | Attending: Emergency Medicine | Admitting: Emergency Medicine

## 2015-04-14 ENCOUNTER — Emergency Department (HOSPITAL_COMMUNITY): Payer: Medicaid Other

## 2015-04-14 ENCOUNTER — Encounter (HOSPITAL_COMMUNITY): Payer: Self-pay | Admitting: Emergency Medicine

## 2015-04-14 DIAGNOSIS — Z8673 Personal history of transient ischemic attack (TIA), and cerebral infarction without residual deficits: Secondary | ICD-10-CM | POA: Insufficient documentation

## 2015-04-14 DIAGNOSIS — F17210 Nicotine dependence, cigarettes, uncomplicated: Secondary | ICD-10-CM | POA: Insufficient documentation

## 2015-04-14 DIAGNOSIS — Z86011 Personal history of benign neoplasm of the brain: Secondary | ICD-10-CM | POA: Diagnosis not present

## 2015-04-14 DIAGNOSIS — R05 Cough: Secondary | ICD-10-CM | POA: Diagnosis not present

## 2015-04-14 DIAGNOSIS — F319 Bipolar disorder, unspecified: Secondary | ICD-10-CM | POA: Diagnosis not present

## 2015-04-14 DIAGNOSIS — L509 Urticaria, unspecified: Secondary | ICD-10-CM | POA: Diagnosis not present

## 2015-04-14 DIAGNOSIS — R0981 Nasal congestion: Secondary | ICD-10-CM | POA: Diagnosis not present

## 2015-04-14 DIAGNOSIS — R6889 Other general symptoms and signs: Secondary | ICD-10-CM

## 2015-04-14 DIAGNOSIS — R51 Headache: Secondary | ICD-10-CM | POA: Diagnosis not present

## 2015-04-14 DIAGNOSIS — R21 Rash and other nonspecific skin eruption: Secondary | ICD-10-CM | POA: Diagnosis present

## 2015-04-14 DIAGNOSIS — R509 Fever, unspecified: Secondary | ICD-10-CM | POA: Diagnosis not present

## 2015-04-14 DIAGNOSIS — Z79899 Other long term (current) drug therapy: Secondary | ICD-10-CM | POA: Diagnosis not present

## 2015-04-14 LAB — CBC WITH DIFFERENTIAL/PLATELET
BASOS PCT: 0 %
Basophils Absolute: 0 10*3/uL (ref 0.0–0.1)
Eosinophils Absolute: 0 10*3/uL (ref 0.0–0.7)
Eosinophils Relative: 0 %
HEMATOCRIT: 36.9 % (ref 36.0–46.0)
HEMOGLOBIN: 12.3 g/dL (ref 12.0–15.0)
LYMPHS PCT: 19 %
Lymphs Abs: 0.9 10*3/uL (ref 0.7–4.0)
MCH: 28.8 pg (ref 26.0–34.0)
MCHC: 33.3 g/dL (ref 30.0–36.0)
MCV: 86.4 fL (ref 78.0–100.0)
MONOS PCT: 8 %
Monocytes Absolute: 0.4 10*3/uL (ref 0.1–1.0)
NEUTROS ABS: 3.5 10*3/uL (ref 1.7–7.7)
NEUTROS PCT: 73 %
Platelets: 148 10*3/uL — ABNORMAL LOW (ref 150–400)
RBC: 4.27 MIL/uL (ref 3.87–5.11)
RDW: 14.3 % (ref 11.5–15.5)
WBC: 4.7 10*3/uL (ref 4.0–10.5)

## 2015-04-14 LAB — COMPREHENSIVE METABOLIC PANEL
ALBUMIN: 3.8 g/dL (ref 3.5–5.0)
ALK PHOS: 74 U/L (ref 38–126)
ALT: 26 U/L (ref 14–54)
AST: 25 U/L (ref 15–41)
Anion gap: 8 (ref 5–15)
BILIRUBIN TOTAL: 0.2 mg/dL — AB (ref 0.3–1.2)
BUN: 12 mg/dL (ref 6–20)
CALCIUM: 8.6 mg/dL — AB (ref 8.9–10.3)
CO2: 21 mmol/L — ABNORMAL LOW (ref 22–32)
Chloride: 111 mmol/L (ref 101–111)
Creatinine, Ser: 1.25 mg/dL — ABNORMAL HIGH (ref 0.44–1.00)
GFR calc Af Amer: >60 mL/min (ref >60)
GFR calc non Af Amer: 55 mL/min — ABNORMAL LOW (ref >60)
GLUCOSE: 117 mg/dL — AB (ref 65–99)
Potassium: 3.3 mmol/L — ABNORMAL LOW (ref 3.5–5.1)
Sodium: 140 mmol/L (ref 135–145)
TOTAL PROTEIN: 7.2 g/dL (ref 6.5–8.1)

## 2015-04-14 MED ORDER — FAMOTIDINE IN NACL 20-0.9 MG/50ML-% IV SOLN
20.0000 mg | Freq: Once | INTRAVENOUS | Status: AC
  Administered 2015-04-14: 20 mg via INTRAVENOUS
  Filled 2015-04-14: qty 50

## 2015-04-14 MED ORDER — DM-GUAIFENESIN ER 30-600 MG PO TB12: 1.0000 | ORAL_TABLET | Freq: Two times a day (BID) | ORAL | Status: DC

## 2015-04-14 MED ORDER — DIPHENHYDRAMINE HCL 25 MG PO TABS
25.0000 mg | ORAL_TABLET | Freq: Four times a day (QID) | ORAL | Status: AC
Start: 2015-04-14 — End: ?

## 2015-04-14 MED ORDER — DIPHENHYDRAMINE HCL 50 MG/ML IJ SOLN
25.0000 mg | Freq: Once | INTRAMUSCULAR | Status: AC
  Administered 2015-04-14: 25 mg via INTRAVENOUS
  Filled 2015-04-14: qty 1

## 2015-04-14 MED ORDER — SODIUM CHLORIDE 0.9 % IV BOLUS (SEPSIS)
1000.0000 mL | Freq: Once | INTRAVENOUS | Status: AC
  Administered 2015-04-14: 1000 mL via INTRAVENOUS

## 2015-04-14 MED ORDER — FAMOTIDINE 20 MG PO TABS: 20.0000 mg | ORAL_TABLET | Freq: Two times a day (BID) | ORAL | Status: DC

## 2015-04-14 MED ORDER — ONDANSETRON HCL 4 MG/2ML IJ SOLN
4.0000 mg | Freq: Once | INTRAMUSCULAR | Status: AC
  Administered 2015-04-14: 4 mg via INTRAVENOUS
  Filled 2015-04-14: qty 2

## 2015-04-14 MED ORDER — METHYLPREDNISOLONE SODIUM SUCC 125 MG IJ SOLR
125.0000 mg | Freq: Once | INTRAMUSCULAR | Status: AC
  Administered 2015-04-14: 125 mg via INTRAVENOUS
  Filled 2015-04-14: qty 2

## 2015-04-14 MED ORDER — PREDNISONE 10 MG PO TABS: 40.0000 mg | ORAL_TABLET | Freq: Every day | ORAL | Status: DC

## 2015-04-14 MED ORDER — SODIUM CHLORIDE 0.9 % IV SOLN
INTRAVENOUS | Status: DC
  Administered 2015-04-14: 10:00:00 via INTRAVENOUS

## 2015-04-14 NOTE — ED Notes (Signed)
Pt c/o productive cough with green mucus, fever, chills, and "itching bites" on her extremities x 4 days. Pt sweating in triage, has been trying to manage symptoms with OTC cold medication without improvement.

## 2015-04-14 NOTE — ED Provider Notes (Signed)
CSN: [id number]     Arrival date & time 04/14/15  [id number] History   First MD Initiated Contact with Patient 04/14/15 0805     Chief Complaint  Patient presents with  . Cough  . Fever     (Consider location/radiation/quality/duration/timing/severity/associated sxs/prior Treatment) Patient is a 35 y.o. female presenting with cough and fever. The history is provided by the patient.  Cough Associated symptoms: fever, headaches, myalgias and rash   Fever Associated symptoms: congestion, cough, headaches, myalgias and rash   Associated symptoms: no confusion, no diarrhea, no dysuria, no nausea and no vomiting    a patient with four-day history of cough congestion productive cough of green mucus fevers chills body aches. Fever broke yesterday and has not reoccurred. Today patient broke out with a red itchy rash kind of well like on her legs and arms. Denies any tongue swelling lip swelling or trouble breathing. No nausea vomiting or diarrhea.  Past Medical History  Diagnosis Date  . Seizures ([place])     last seizure 2 weeks ago  . Stroke ([place])   . Brain tumor (benign) ([place])   . PTSD (post-traumatic stress disorder)   . Bipolar 1 disorder ([place])   . Personality disorder    Past Surgical History  Procedure Laterality Date  . Tubal ligation    . Tumor removal    . Leg surgery     History reviewed. No pertinent family history. Social History  Substance Use Topics  . Smoking status: Current Every Day Smoker -- 0.50 packs/day    Types: Cigarettes  . Smokeless tobacco: Never Used  . Alcohol Use: No   OB History    Gravida Para Term Preterm AB TAB SAB Ectopic Multiple Living   6 2   4  4   2      Review of Systems  Constitutional: Positive for fever.  HENT: Positive for congestion.   Eyes: Negative for redness and visual disturbance.  Respiratory: Positive for cough.   Gastrointestinal: Negative for nausea, vomiting, abdominal pain and diarrhea.  Genitourinary: Negative for dysuria.   Musculoskeletal: Positive for myalgias.  Skin: Positive for rash.  Neurological: Positive for headaches.  Psychiatric/Behavioral: Negative for confusion.      Allergies  Sulfa antibiotics; Aripiprazole; Duloxetine hcl; and Vicodin  Home Medications   Prior to Admission medications   Medication Sig Start Date End Date Taking? Authorizing Provider  gabapentin (NEURONTIN) 300 MG capsule Take 600 mg by mouth 3 (three) times daily.    Yes Historical Provider, MD  levETIRAcetam (KEPPRA) 1000 MG tablet Take 2,000 mg by mouth 2 (two) times daily.    Yes Historical Provider, MD  methadone (DOLOPHINE) 10 MG/5ML solution Take 100 mg by mouth daily.    Yes Historical Provider, MD  topiramate (TOPAMAX) 100 MG tablet Take 100 mg by mouth 2 (two) times daily.   Yes Historical Provider, MD  traZODone (DESYREL) 50 MG tablet Take 50 mg by mouth at bedtime.   Yes Historical Provider, MD  benzonatate (TESSALON) 100 MG capsule Take 1 capsule (100 mg total) by mouth every 8 (eight) hours. Patient not taking: Reported on 04/14/2015 02/26/15   Carlisle Cater, PA-C  dextromethorphan-guaiFENesin [facility] DM) 30-600 MG 12hr tablet Take 1 tablet by mouth 2 (two) times daily. 04/14/15   Fredia Sorrow, MD  diphenhydrAMINE (BENADRYL) 25 MG tablet Take 1 tablet (25 mg total) by mouth every 6 (six) hours. 04/14/15   Fredia Sorrow, MD  famotidine (PEPCID) 20 MG tablet Take 1 tablet (20  mg total) by mouth 2 (two) times daily. 04/14/15   Fredia Sorrow, MD  ibuprofen (ADVIL,MOTRIN) 800 MG tablet Take 1 tablet (800 mg total) by mouth 3 (three) times daily. Patient not taking: Reported on 04/14/2015 12/30/14   Marella Chimes, PA-C  ondansetron (ZOFRAN ODT) 4 MG disintegrating tablet 4mg  ODT q4 hours prn nausea/vomit Patient not taking: Reported on 04/14/2015 12/20/14   Milton Ferguson, MD  predniSONE (DELTASONE) 10 MG tablet Take 4 tablets (40 mg total) by mouth daily. 04/14/15   Fredia Sorrow, MD   BP 108/78 mmHg   Pulse 62  Temp(Src) 98.5 F (36.9 C) (Oral)  Resp 18  SpO2 98%  LMP  Physical Exam  Constitutional: She is oriented to person, place, and time. She appears well-developed and well-nourished. No distress.  HENT:  Head: Normocephalic and atraumatic.  Mucous membranes dry. No lip swelling no tongue swelling  Eyes: Conjunctivae and EOM are normal.  Neck: Normal range of motion. Neck supple.  Cardiovascular: Normal rate, regular rhythm and normal heart sounds.   No murmur heard. Pulmonary/Chest: Effort normal. No respiratory distress. She has no wheezes.  Abdominal: Soft. Bowel sounds are normal. There is no tenderness.  Musculoskeletal: Normal range of motion. She exhibits no edema.  Neurological: She is alert and oriented to person, place, and time. No cranial nerve deficit. She exhibits normal muscle tone. Coordination normal.  Skin: Skin is warm. Rash noted.  Rash consistent with hives. No bullae. Scattered throughout her body. More prominent dominant on the thighs.  Nursing note and vitals reviewed.   ED Course  Procedures (including critical care time) Labs Review Labs Reviewed  CBC WITH DIFFERENTIAL/PLATELET - Abnormal; Notable for the following:    Platelets 148 (*)    All other components within normal limits  COMPREHENSIVE METABOLIC PANEL - Abnormal; Notable for the following:    Potassium 3.3 (*)    CO2 21 (*)    Glucose, Bld 117 (*)    Creatinine, Ser 1.25 (*)    Calcium 8.6 (*)    Total Bilirubin 0.2 (*)    GFR calc non Af Amer 55 (*)    All other components within normal limits   Results for orders placed or performed during the hospital encounter of 04/14/15  CBC with Differential/Platelet  Result Value Ref Range   WBC 4.7 4.0 - 10.5 K/uL   RBC 4.27 3.87 - 5.11 MIL/uL   Hemoglobin 12.3 12.0 - 15.0 g/dL   HCT 36.9 36.0 - 46.0 %   MCV 86.4 78.0 - 100.0 fL   MCH 28.8 26.0 - 34.0 pg   MCHC 33.3 30.0 - 36.0 g/dL   RDW 14.3 11.5 - 15.5 %   Platelets 148 (L) 150  - 400 K/uL   Neutrophils Relative % 73 %   Neutro Abs 3.5 1.7 - 7.7 K/uL   Lymphocytes Relative 19 %   Lymphs Abs 0.9 0.7 - 4.0 K/uL   Monocytes Relative 8 %   Monocytes Absolute 0.4 0.1 - 1.0 K/uL   Eosinophils Relative 0 %   Eosinophils Absolute 0.0 0.0 - 0.7 K/uL   Basophils Relative 0 %   Basophils Absolute 0.0 0.0 - 0.1 K/uL  Comprehensive metabolic panel  Result Value Ref Range   Sodium 140 135 - 145 mmol/L   Potassium 3.3 (L) 3.5 - 5.1 mmol/L   Chloride 111 101 - 111 mmol/L   CO2 21 (L) 22 - 32 mmol/L   Glucose, Bld 117 (H) 65 - 99 mg/dL  BUN 12 6 - 20 mg/dL   Creatinine, Ser 1.25 (H) 0.44 - 1.00 mg/dL   Calcium 8.6 (L) 8.9 - 10.3 mg/dL   Total Protein 7.2 6.5 - 8.1 g/dL   Albumin 3.8 3.5 - 5.0 g/dL   AST 25 15 - 41 U/L   ALT 26 14 - 54 U/L   Alkaline Phosphatase 74 38 - 126 U/L   Total Bilirubin 0.2 (L) 0.3 - 1.2 mg/dL   GFR calc non Af Amer 55 (L) >60 mL/min   GFR calc Af Amer >60 >60 mL/min   Anion gap 8 5 - 15     Imaging Review Dg Chest 2 View  04/14/2015  CLINICAL DATA:  Pt c/o productive cough with green mucus, fever, chills, x4days Current smoker. EXAM: CHEST  2 VIEW COMPARISON:  01/14/2015 FINDINGS: The heart size and mediastinal contours are within normal limits. Both lungs are clear. No pleural effusion or pneumothorax. The visualized skeletal structures are unremarkable. IMPRESSION: No active cardiopulmonary disease. Electronically Signed   By: Lajean Manes M.D.   On: 04/14/2015 10:04   I have personally reviewed and evaluated these images and lab results as part of my medical decision-making.   EKG Interpretation None      MDM   Final diagnoses:  Flu-like symptoms  Hives   Patient with a history of flulike illness for the past 4 days. Associated with productive cough fever which broke yesterday chills body aches. And then of today started with a rash that was itchy. Rash consistent with hives. No bullae.  Other symptoms seem to be consistent  with influenza. Chest x-ray negative for pneumonia. No leukocytosis no significant lab abnormalities other than some mild hypokalemia.   Findings no evidence of any angioedema C probably related to immune system being geared up for the influenza.   Patient treated here with signed Medrol Benadryl and Pepcid with some improvement. Certainly no worsening symptoms. Patient stable for discharge home and symptomatic treatment. We'll also treat with Mucinex DM for the cough and congestion.   Patient feeling better after 1 L of normal saline.  Patient not a candidate for an antiviral since symptoms been ongoing for 4 days.   Fredia Sorrow, MD 04/14/15 1225

## 2015-04-14 NOTE — Discharge Instructions (Signed)
Symptoms most consistent with flulike illness. No evidence of pneumonia. Hives probably secondary to the flu illness. Take medications as directed. Take Mucinex DM for the cough and congestion and phlegm. Take the prednisone for the next 5 days. Take the Benadryl for the next 2 days then as needed. Pepcid for the next 7 days. Return for any new or worse symptoms.

## 2016-06-18 ENCOUNTER — Emergency Department (HOSPITAL_BASED_OUTPATIENT_CLINIC_OR_DEPARTMENT_OTHER): Payer: Medicaid Other

## 2016-06-18 ENCOUNTER — Emergency Department (HOSPITAL_BASED_OUTPATIENT_CLINIC_OR_DEPARTMENT_OTHER)
Admission: EM | Admit: 2016-06-18 | Discharge: 2016-06-18 | Disposition: A | Discharge status: Home / Self Care | Payer: Medicaid Other | Attending: Emergency Medicine | Admitting: Emergency Medicine

## 2016-06-18 ENCOUNTER — Encounter (HOSPITAL_BASED_OUTPATIENT_CLINIC_OR_DEPARTMENT_OTHER): Payer: Self-pay | Admitting: Emergency Medicine

## 2016-06-18 DIAGNOSIS — Z791 Long term (current) use of non-steroidal anti-inflammatories (NSAID): Secondary | ICD-10-CM | POA: Diagnosis not present

## 2016-06-18 DIAGNOSIS — Y939 Activity, unspecified: Secondary | ICD-10-CM | POA: Insufficient documentation

## 2016-06-18 DIAGNOSIS — S92515A Nondisplaced fracture of proximal phalanx of left lesser toe(s), initial encounter for closed fracture: Secondary | ICD-10-CM | POA: Diagnosis not present

## 2016-06-18 DIAGNOSIS — S99922A Unspecified injury of left foot, initial encounter: Secondary | ICD-10-CM | POA: Diagnosis present

## 2016-06-18 DIAGNOSIS — W2203XA Walked into furniture, initial encounter: Secondary | ICD-10-CM | POA: Diagnosis not present

## 2016-06-18 DIAGNOSIS — Y929 Unspecified place or not applicable: Secondary | ICD-10-CM | POA: Insufficient documentation

## 2016-06-18 DIAGNOSIS — F17210 Nicotine dependence, cigarettes, uncomplicated: Secondary | ICD-10-CM | POA: Diagnosis not present

## 2016-06-18 DIAGNOSIS — Y999 Unspecified external cause status: Secondary | ICD-10-CM | POA: Diagnosis not present

## 2016-06-18 DIAGNOSIS — R112 Nausea with vomiting, unspecified: Secondary | ICD-10-CM | POA: Insufficient documentation

## 2016-06-18 DIAGNOSIS — Z79899 Other long term (current) drug therapy: Secondary | ICD-10-CM | POA: Insufficient documentation

## 2016-06-18 MED ORDER — NAPROXEN 500 MG PO TABS: 500.0000 mg | ORAL_TABLET | Freq: Two times a day (BID) | ORAL | 0 refills | Status: DC

## 2016-06-18 MED ORDER — IBUPROFEN 800 MG PO TABS
800.0000 mg | ORAL_TABLET | Freq: Once | ORAL | Status: AC
  Administered 2016-06-18: 800 mg via ORAL
  Filled 2016-06-18: qty 1

## 2016-06-18 MED ORDER — OXYCODONE-ACETAMINOPHEN 5-325 MG PO TABS: 1.0000 | ORAL_TABLET | Freq: Once | ORAL | Status: DC

## 2016-06-18 MED FILL — NAPROXEN 500 MG TABLET: 500 | 7 days supply | Qty: 14 | Fill #0

## 2016-06-18 NOTE — ED Provider Notes (Signed)
[place] DEPT [facility] Provider Note   CSN: [id number] Arrival date & time: 06/18/16  1145     History   Chief Complaint Chief Complaint  Patient presents with  . Foot Pain    HPI Heather Barton is a 36 y.o. female who presents today with acute onset constant sharp left 5th toe pain which began 4 days ago. Pt states she hit the leg of a chair with her lft little toe with immediate onset of pain, swelling, and bruising. Ice, ibuprofen, and Excedrin have not been helpful. Pain is 5/10 sharp throbbing intermittent, worse with walking, moving toe, and touching it. Pt states she is able to ambulate and bear weight on her foot.  Endorses nausea and vomiting which she believes is due to pain.  Denies numbness, tingling, weakness, hitting head, LOC, CP/SOB, v/d, neck pain, dysuria, hematuria.   The history is provided by the patient.    Past Medical History:  Diagnosis Date  . Bipolar 1 disorder ([place])   . Brain tumor (benign) ([place])   . Personality disorder   . PTSD (post-traumatic stress disorder)   . Seizures ([place])    last seizure 2 weeks ago  . Stroke [facility])     There are no active problems to display for this patient.   Past Surgical History:  Procedure Laterality Date  . LEG SURGERY    . TUBAL LIGATION    . TUMOR REMOVAL      OB History    Gravida Para Term Preterm AB Living   6 2     4 2    SAB TAB Ectopic Multiple Live Births   4               Home Medications    Prior to Admission medications   Medication Sig Start Date End Date Taking? Authorizing Provider  ARIPiprazole (ABILIFY) 10 MG tablet Take 10 mg by mouth daily.   Yes Historical Provider, MD  sertraline (ZOLOFT) 100 MG tablet Take 100 mg by mouth daily.   Yes Historical Provider, MD  traZODone (DESYREL) 100 MG tablet Take 100 mg by mouth at bedtime. Takes 4 tablets     400mg    Yes Historical Provider, MD  diphenhydrAMINE (BENADRYL) 25 MG tablet Take 1 tablet (25 mg total) by mouth every 6 (six)  hours. 04/14/15   Fredia Sorrow, MD  gabapentin (NEURONTIN) 300 MG capsule Take 600 mg by mouth 3 (three) times daily.     Historical Provider, MD  ibuprofen (ADVIL,MOTRIN) 800 MG tablet Take 1 tablet (800 mg total) by mouth 3 (three) times daily. Patient not taking: Reported on 04/14/2015 12/30/14   Marella Chimes, PA-C  levETIRAcetam (KEPPRA) 1000 MG tablet Take 2,000 mg by mouth 2 (two) times daily.     Historical Provider, MD  naproxen (NAPROSYN) 500 MG tablet Take 1 tablet (500 mg total) by mouth 2 (two) times daily. 06/18/16   Fredia Sorrow, MD    Family History No family history on file.  Social History Social History  Substance Use Topics  . Smoking status: Current Every Day Smoker    Packs/day: 0.50    Types: Cigarettes  . Smokeless tobacco: Never Used  . Alcohol use No     Allergies   Sulfa antibiotics; Aripiprazole; Duloxetine hcl; and Vicodin [hydrocodone-acetaminophen]   Review of Systems Review of Systems  Constitutional: Negative for chills and fever.  Respiratory: Negative for shortness of breath.   Cardiovascular: Negative for chest pain.  Gastrointestinal: Positive for  nausea and vomiting. Negative for abdominal pain and diarrhea.  Genitourinary: Negative for dysuria and hematuria.  Musculoskeletal: Positive for arthralgias. Negative for back pain and neck pain.  Skin: Positive for color change.  Neurological: Negative for syncope, weakness, numbness and headaches.     Physical Exam Updated Vital Signs BP (!) 116/93 (BP Location: Left Arm)   Pulse 82   Temp 98.2 F (36.8 C) (Oral)   Resp 16   Ht 5\' 6"  (1.676 m)   Wt 71.7 kg   SpO2 97%   BMI 25.50 kg/m   Physical Exam  Constitutional: She is oriented to person, place, and time. She appears well-developed and well-nourished.  HENT:  Head: Normocephalic and atraumatic.  Eyes: EOM are normal.  Neck: Normal range of motion. Neck supple.  Cardiovascular: Normal rate and intact distal pulses.    2+ DP/PT pulses bilaterally, negative Homans bilaterally  Pulmonary/Chest: Effort normal.  Abdominal: She exhibits no distension.  Musculoskeletal: Normal range of motion. She exhibits edema and tenderness.  Normal range of motion of right ankle. Normal range of motion of the left ankle with pain. There is ecchymosis to the left lateral foot, which is tender to palpation. There is swelling to the fifth left toe and limited range of motion of the toes. 5/5 strength with dorsiflexion, plantarflexion, inversion, eversion of left ankle and 5/5 EHL strength. No significant swelling to left ankle, no erythema, no crepitus, no ligamentous laxity. Calf is normal with soft compartments  Neurological: She is alert and oriented to person, place, and time. No sensory deficit.  Fluent speech, no facial droop, sensation intact globally, antalgic gait  Skin: Skin is warm and dry. Capillary refill takes less than 2 seconds. No erythema.  Psychiatric: She has a normal mood and affect. Her behavior is normal.     ED Treatments / Results  Labs (all labs ordered are listed, but only abnormal results are displayed) Labs Reviewed - No data to display  EKG  EKG Interpretation None       Radiology Dg Ankle Complete Left  Result Date: 06/18/2016 CLINICAL DATA:  Lateral foot pain following blunt trauma 4 days ago, initial encounter EXAM: LEFT ANKLE COMPLETE - 3+ VIEW COMPARISON:  06/25/2009 FINDINGS: There is no evidence of fracture, dislocation, or joint effusion. There is no evidence of arthropathy or other focal bone abnormality. Soft tissues are unremarkable. IMPRESSION: No acute abnormality noted. Electronically Signed   By: Inez Catalina M.D.   On: 06/18/2016 13:23   Dg Foot Complete Left  Result Date: 06/18/2016 CLINICAL DATA:  Blunt trauma 4 days ago with lateral foot pain, initial encounter EXAM: LEFT FOOT - COMPLETE 3+ VIEW COMPARISON:  06/25/2009 FINDINGS: Mildly impacted fracture at the distal  aspect of the fifth proximal phalanx is noted. No other focal abnormality is seen. Chronic irregularity is noted at the distal aspect of the lateral calcaneus. No other focal abnormality is seen. IMPRESSION: Fifth proximal phalangeal fracture. Chronic changes along the distal aspect of the calcaneus laterally. Electronically Signed   By: Inez Catalina M.D.   On: 06/18/2016 13:22    Procedures Procedures (including critical care time)  Medications Ordered in ED Medications  ibuprofen (ADVIL,MOTRIN) tablet 800 mg (800 mg Oral Given 06/18/16 1317)     Initial Impression / Assessment and Plan / ED Course  I have reviewed the triage vital signs and the nursing notes.  Pertinent labs & imaging results that were available during my care of the patient were reviewed by  me and considered in my medical decision making (see chart for details).     Patient with left pinky toe pain for 4 days after running into a chair leg. Pt afebrile, VSS, ambulatory while in ED. Low suspicion DVT, compartment syndrome, septic joint, gout. Xrays show fifth proximal phalangeal fracture. Buddy tape and post op boot applied. Recommend follow up with sports medicine for further management. Discussed RICE, tylenol, and ibuprofen in the meantime. Per [place] controlled substance registry, pt is receiving suboxone and has pain management physician; explained to pt that I am unable to prescribe pain medication for her and that she should follow up with her pain management physician for pain control. Rx for EC naprosyn provided. Discussed strict ED return precautions. Pt verbalized understanding of and agreement with plan and is safe for discharge home at this time.   Final Clinical Impressions(s) / ED Diagnoses   Final diagnoses:  Closed nondisplaced fracture of proximal phalanx of lesser toe of left foot, initial encounter    New Prescriptions Discharge Medication List as of 06/18/2016  2:09 PM    START taking these medications    Details  naproxen (NAPROSYN) 500 MG tablet Take 1 tablet (500 mg total) by mouth 2 (two) times daily., Starting Thu 06/18/2016, [place], PA-C 06/19/16 [address] [place], PA-C 06/19/16 1049    Fredia Sorrow, MD 06/25/16 2106

## 2016-06-18 NOTE — ED Notes (Signed)
Pt states "she said she was going to give me something else for pain". Discussed with [place], [place]. Pt informed (per PA) she had received ibuprofen and an Rx for naproxen, any additional medications would need to be handled by pain management. Pt ambulatory on crutches to d/c window

## 2016-06-18 NOTE — ED Triage Notes (Signed)
Pain to left foot swelling to little toe    Struck on chair at home

## 2016-11-23 ENCOUNTER — Emergency Department (HOSPITAL_BASED_OUTPATIENT_CLINIC_OR_DEPARTMENT_OTHER): Payer: Medicaid Other

## 2016-11-23 ENCOUNTER — Encounter (HOSPITAL_BASED_OUTPATIENT_CLINIC_OR_DEPARTMENT_OTHER): Payer: Self-pay | Admitting: Emergency Medicine

## 2016-11-23 ENCOUNTER — Emergency Department (HOSPITAL_BASED_OUTPATIENT_CLINIC_OR_DEPARTMENT_OTHER)
Admission: EM | Admit: 2016-11-23 | Discharge: 2016-11-23 | Disposition: A | Discharge status: Home / Self Care | Payer: Medicaid Other | Attending: Emergency Medicine | Admitting: Emergency Medicine

## 2016-11-23 DIAGNOSIS — Z8673 Personal history of transient ischemic attack (TIA), and cerebral infarction without residual deficits: Secondary | ICD-10-CM | POA: Insufficient documentation

## 2016-11-23 DIAGNOSIS — R569 Unspecified convulsions: Secondary | ICD-10-CM | POA: Diagnosis not present

## 2016-11-23 DIAGNOSIS — F17210 Nicotine dependence, cigarettes, uncomplicated: Secondary | ICD-10-CM | POA: Diagnosis not present

## 2016-11-23 LAB — BASIC METABOLIC PANEL
ANION GAP: 6 (ref 5–15)
BUN: 12 mg/dL (ref 6–20)
CO2: 22 mmol/L (ref 22–32)
Calcium: 8.4 mg/dL — ABNORMAL LOW (ref 8.9–10.3)
Chloride: 108 mmol/L (ref 101–111)
Creatinine, Ser: 1.22 mg/dL — ABNORMAL HIGH (ref 0.44–1.00)
GFR calc Af Amer: >60 mL/min (ref >60)
GFR, EST NON AFRICAN AMERICAN: 55 mL/min — AB (ref >60)
Glucose, Bld: 68 mg/dL (ref 65–99)
POTASSIUM: 3.9 mmol/L (ref 3.5–5.1)
Sodium: 136 mmol/L (ref 135–145)

## 2016-11-23 LAB — CBC WITH DIFFERENTIAL/PLATELET
BASOS ABS: 0 10*3/uL (ref 0.0–0.1)
Basophils Relative: 0 %
EOS PCT: 0 %
Eosinophils Absolute: 0 10*3/uL (ref 0.0–0.7)
HCT: 33.4 % — ABNORMAL LOW (ref 36.0–46.0)
Hemoglobin: 11.1 g/dL — ABNORMAL LOW (ref 12.0–15.0)
LYMPHS PCT: 52 %
Lymphs Abs: 2.6 10*3/uL (ref 0.7–4.0)
MCH: 26.1 pg (ref 26.0–34.0)
MCHC: 33.2 g/dL (ref 30.0–36.0)
MCV: 78.6 fL (ref 78.0–100.0)
Monocytes Absolute: 0.3 10*3/uL (ref 0.1–1.0)
Monocytes Relative: 5 %
NEUTROS ABS: 2.2 10*3/uL (ref 1.7–7.7)
Neutrophils Relative %: 43 %
PLATELETS: 203 10*3/uL (ref 150–400)
RBC: 4.25 MIL/uL (ref 3.87–5.11)
RDW: 15.7 % — ABNORMAL HIGH (ref 11.5–15.5)
WBC: 5.1 10*3/uL (ref 4.0–10.5)

## 2016-11-23 LAB — PREGNANCY, URINE: Preg Test, Ur: NEGATIVE

## 2016-11-23 MED ORDER — SODIUM CHLORIDE 0.9 % IV BOLUS (SEPSIS)
1000.0000 mL | Freq: Once | INTRAVENOUS | Status: AC
  Administered 2016-11-23: 1000 mL via INTRAVENOUS

## 2016-11-23 NOTE — ED Notes (Addendum)
Patient returning from CT by CT tech who states patient is having a seizure.  RN at the bedside x 3. Patient noticed to have shaking x 4 extremities and would not respond to verbal command.  Dr. Laverta Baltimore at the bedside to evaluate.  Dr. Laverta Baltimore performed sternal rub.  Patient immediately said "ouch" and was awake and conversing.  After MD left room patient began screaming and saying "that hurt".  Dr. Laverta Baltimore back to bedside to reevaluate.  Patient awake, alert, yelling in full sentences, ambulatory around room, cussing at staff.  Security at bedside.  MD explained reason for sternal rub and explained plan of care.  Family back to bedside and encouraged patient to stay and calm down.   CN and Surveyor, quantity aware of situation.  Patient states she will stay to finish treatment and will no longer continue yelling.

## 2016-11-23 NOTE — ED Notes (Signed)
ED Provider at bedside. 

## 2016-11-23 NOTE — ED Triage Notes (Signed)
Patient states that she is here because she has had 8 -9 seizures today and has a headache to her temples and she cant hear. The patient reports that she has pain to the back of her head as well. She has bilateral ear plugs in. The patients family states that she has altered speech.

## 2016-11-23 NOTE — ED Notes (Signed)
Patient had episode of shaking legs and raising them off the bed.  Patient tracking staff when event occurred-controlled voluntary movement.  Family states this is what happens when she has a stroke.  Prior to this patient alert and oriented in NAD.  MD made aware.  Patient fully alert with no loss of bowel or bladder control when event occurred.  Patient immediately alert and oriented and speaking in full sentences without difficulty.

## 2016-11-23 NOTE — ED Provider Notes (Signed)
Emergency Department Provider Note   I have reviewed the triage vital signs and the nursing notes.   HISTORY  Chief Complaint Seizures   HPI Heather Barton is a 36 y.o. female with PMH of bipolar disorder, PTSD, and seizure disorder with strong suspicion for non-epileptiform seizure presents to the emergency department for evaluation of multiple breakthrough seizures over the last 6 weeks. Patient states that she is followed by neurology at [facility] and has been compliant with Topomax and Keppra. 2 days ago she fell and hit the back of her head during a mechanical fall. During the last 2 days especially she's had increased seizures to approximately 8-9 seizures per day. The last minutes and resolved spontaneously. No obvious postictal period. Patient states these seizures are typical of her activity but have become more frequent. Family noticed a possible speech change since the fall 2 days ago. No vision changes or difficulty swallowing. Patient is actively following with her neurologist as an outpatient with plan for EEG this week.   Patient is also complaining of difficulty hearing bilaterally and a popping sensation behind her eyes since the fall. No modifying factors. Symptoms are non-radiating.    Past Medical History:  Diagnosis Date  . Bipolar 1 disorder ([place])   . Brain tumor (benign) ([place])   . Personality disorder ([place])   . PTSD (post-traumatic stress disorder)   . Seizures ([place])    last seizure 2 weeks ago  . Stroke [facility])     There are no active problems to display for this patient.   Past Surgical History:  Procedure Laterality Date  . LEG SURGERY    . TUBAL LIGATION    . TUMOR REMOVAL      Current Outpatient Rx  . Order #: [id number] Class: Historical Med  . Order #: [id number] Class: Print  . Order #: [id number] Class: Historical Med  . Order #: [id number] Class: Print  . Order #: [id number] Class: Historical Med  . Order #: [id number] Class: Print  . Order #:  [id number] Class: Historical Med  . Order #: [id number] Class: Historical Med    Allergies Sulfa antibiotics; Aripiprazole; Duloxetine hcl; and Vicodin [hydrocodone-acetaminophen]  History reviewed. No pertinent family history.  Social History Social History  Substance Use Topics  . Smoking status: Current Every Day Smoker    Packs/day: 0.50    Types: Cigarettes  . Smokeless tobacco: Never Used  . Alcohol use No    Review of Systems  Constitutional: No fever/chills Eyes: No visual changes. ENT: No sore throat. Cardiovascular: Denies chest pain. Respiratory: Denies shortness of breath. Gastrointestinal: No abdominal pain.  No nausea, no vomiting.  No diarrhea.  No constipation. Genitourinary: Negative for dysuria. Musculoskeletal: Negative for back pain. Skin: Negative for rash. Neurological: Negative for headaches, focal weakness or numbness. Positive breakthrough seizures and speech change.   10-point ROS otherwise negative.  ____________________________________________   PHYSICAL EXAM:  VITAL SIGNS: ED Triage Vitals  Enc Vitals Group     BP 11/23/16 1644 (!) 105/56     Pulse Rate 11/23/16 1644 74     Resp 11/23/16 1644 18     Temp 11/23/16 1644 98.5 F (36.9 C)     Temp Source 11/23/16 1644 Oral     SpO2 11/23/16 1644 100 %     Weight 11/23/16 1645 153 lb (69.4 kg)     Height 11/23/16 1645 5\' 6"  (1.676 m)     Pain Score 11/23/16 1644 8   Constitutional: Alert and oriented. Well appearing and in no acute  distress. Eyes: Conjunctivae are normal. PERRL. EOMI. Head: Atraumatic. Ears:  Healthy appearing ear canals and TMs bilaterally Nose: No congestion/rhinnorhea. Mouth/Throat: Mucous membranes are moist.   Neck: No stridor.  No meningeal signs.   Cardiovascular: Normal rate, regular rhythm. Good peripheral circulation. Grossly normal heart sounds.   Respiratory: Normal respiratory effort.  No retractions. Lungs CTAB. Gastrointestinal: Soft and nontender. No  distention.  Musculoskeletal: No lower extremity tenderness nor edema. No gross deformities of extremities. Neurologic:  Normal speech and language. No gross focal neurologic deficits are appreciated. Normal CN exam 2-12.  Skin:  Skin is warm, dry and intact. No rash noted.  ____________________________________________   LABS (all labs ordered are listed, but only abnormal results are displayed)  Labs Reviewed  BASIC METABOLIC PANEL - Abnormal; Notable for the following:       Result Value   Creatinine, Ser 1.22 (*)    Calcium 8.4 (*)    GFR calc non Af Amer 55 (*)    All other components within normal limits  CBC WITH DIFFERENTIAL/PLATELET - Abnormal; Notable for the following:    Hemoglobin 11.1 (*)    HCT 33.4 (*)    RDW 15.7 (*)    All other components within normal limits  PREGNANCY, URINE   ____________________________________________  RADIOLOGY  Ct Head Wo Contrast  Result Date: 11/23/2016 CLINICAL DATA:  Seizures.  Headache. EXAM: CT HEAD WITHOUT CONTRAST TECHNIQUE: Contiguous axial images were obtained from the base of the skull through the vertex without intravenous contrast. COMPARISON:  10/26/2016 FINDINGS: Brain: Small hypodensities along the globus pallidus nuclei possibly from prior remote lacunar infarcts or dilated perivascular spaces. Unchanged from prior. Otherwise, the brainstem, cerebellum, cerebral peduncles, thalami, basal ganglia, basilar cisterns, and ventricular system appear within normal limits. No intracranial hemorrhage, mass lesion, or acute CVA. Vascular: Unremarkable Skull: Unremarkable Sinuses/Orbits: Unremarkable Other: Right mandibular condyles deformities suggesting TMJ arthropathy. IMPRESSION: 1. No acute intracranial findings. Several small hypodense lesions along the globus pallidus nuclei bilaterally, chronically stable and likely small remote lacunar infarcts. 2. Right TMJ arthropathy, stable. Electronically Signed   By: Van Clines M.D.    On: 11/23/2016 18:03    ____________________________________________   PROCEDURES  Procedure(s) performed:   Procedures  None ____________________________________________   INITIAL IMPRESSION / ASSESSMENT AND PLAN / ED COURSE  Pertinent labs & imaging results that were available during my care of the patient were reviewed by me and considered in my medical decision making (see chart for details).  Patient resents emergency department for evaluation of breakthrough seizure. She had a seizure here in the emergency department witnessed by nursing staff which was very atypical. There was no postictal period. No evidence of focal neurological deficit on my exam. Review of care everywhere notes show patient with multiple ED presentations with similar symptoms. She is followed by outpatient neurology, Dr. Everette Rank who plans for outpatient EEG and is refilling seizure medication by phone. With recent fall and worsening symptoms since then I plan for CT head and labs.   05:56 PM Called to patient's room to observe active seizure activity. She was shaking her head back and forth visit saying no as well as flexing and extending her arms and legs independently. This did not appear consistent with epileptiform seizure activity. I tried to get her attention verbally but the patient did not respond. I provided a sternal rub which causes the patient to immediately scream out and stop her seizure. She immediately stood up and began yelling about the sternal  rub. No post-ictal period. No apparent speech or motor deficits. Family in the room to help calm the patient down.   06:20 PM Discussed the results and imaging with the patient. No appreciable speech abnormality on my exam. She will follow with her outpatient neurologist. She does not drive a car. She's also been told that stress may be contributing to her seizure activity and was given contact information for a local psychiatrist who she plans to  contact tomorrow.   At this time, I do not feel there is any life-threatening condition present. I have reviewed and discussed all results (EKG, imaging, lab, urine as appropriate), exam findings with patient. I have reviewed nursing notes and appropriate previous records.  I feel the patient is safe to be discharged home without further emergent workup. Discussed usual and customary return precautions. Patient and family (if present) verbalize understanding and are comfortable with this plan.  Patient will follow-up with their primary care provider. If they do not have a primary care provider, information for follow-up has been provided to them. All questions have been answered.  ____________________________________________  FINAL CLINICAL IMPRESSION(S) / ED DIAGNOSES  Final diagnoses:  Seizure-like activity ([place])     MEDICATIONS GIVEN DURING THIS VISIT:  Medications  sodium chloride 0.9 % bolus 1,000 mL (0 mLs Intravenous Stopped 11/23/16 1804)     NEW OUTPATIENT MEDICATIONS STARTED DURING THIS VISIT:  None  Note:  This document was prepared using Dragon voice recognition software and may include unintentional dictation errors.  Nanda Quinton, MD Emergency Medicine    [place], [facility], MD 11/23/16 [phone]

## 2016-11-23 NOTE — Discharge Instructions (Signed)
You have been seen in the emergency department today for a seizure.  Your workup today including labs are within normal limits.  Please follow up with your doctor/neurologist as soon as possible regarding today's emergency department visit and your likely seizure.  Try to limit your stress and call the psychiatrist this week to establish care with them.   As we have discussed it is very important that you do not drive until you have been seen and cleared by your neurologist.  Please drink plenty of fluids, get plenty of sleep and avoid any alcohol or drug use Please return to the emergency department if you have any further seizures which do not respond to medications, or for any other symptoms per se concerning for yourself.

## 2017-10-14 ENCOUNTER — Emergency Department (HOSPITAL_BASED_OUTPATIENT_CLINIC_OR_DEPARTMENT_OTHER): Payer: Medicaid Other

## 2017-10-14 ENCOUNTER — Encounter (HOSPITAL_BASED_OUTPATIENT_CLINIC_OR_DEPARTMENT_OTHER): Payer: Self-pay | Admitting: Emergency Medicine

## 2017-10-14 ENCOUNTER — Emergency Department (HOSPITAL_BASED_OUTPATIENT_CLINIC_OR_DEPARTMENT_OTHER)
Admission: EM | Admit: 2017-10-14 | Discharge: 2017-10-14 | Disposition: A | Discharge status: Home / Self Care | Payer: Medicaid Other | Attending: Emergency Medicine | Admitting: Emergency Medicine

## 2017-10-14 ENCOUNTER — Other Ambulatory Visit: Payer: Self-pay

## 2017-10-14 DIAGNOSIS — W19XXXA Unspecified fall, initial encounter: Secondary | ICD-10-CM

## 2017-10-14 DIAGNOSIS — M545 Low back pain: Secondary | ICD-10-CM | POA: Diagnosis not present

## 2017-10-14 DIAGNOSIS — W109XXA Fall (on) (from) unspecified stairs and steps, initial encounter: Secondary | ICD-10-CM | POA: Insufficient documentation

## 2017-10-14 DIAGNOSIS — Y999 Unspecified external cause status: Secondary | ICD-10-CM | POA: Insufficient documentation

## 2017-10-14 DIAGNOSIS — F17210 Nicotine dependence, cigarettes, uncomplicated: Secondary | ICD-10-CM | POA: Insufficient documentation

## 2017-10-14 DIAGNOSIS — Y929 Unspecified place or not applicable: Secondary | ICD-10-CM | POA: Insufficient documentation

## 2017-10-14 DIAGNOSIS — Y9301 Activity, walking, marching and hiking: Secondary | ICD-10-CM | POA: Diagnosis not present

## 2017-10-14 DIAGNOSIS — R1012 Left upper quadrant pain: Secondary | ICD-10-CM

## 2017-10-14 DIAGNOSIS — Z79899 Other long term (current) drug therapy: Secondary | ICD-10-CM | POA: Diagnosis not present

## 2017-10-14 LAB — URINALYSIS, MICROSCOPIC (REFLEX)

## 2017-10-14 LAB — URINALYSIS, ROUTINE W REFLEX MICROSCOPIC
GLUCOSE, UA: NEGATIVE mg/dL
HGB URINE DIPSTICK: NEGATIVE
KETONES UR: 15 mg/dL — AB
Nitrite: NEGATIVE
PROTEIN: NEGATIVE mg/dL
Specific Gravity, Urine: >1.03 — ABNORMAL HIGH (ref 1.005–1.030)
pH: 5.5 (ref 5.0–8.0)

## 2017-10-14 LAB — PREGNANCY, URINE: PREG TEST UR: NEGATIVE

## 2017-10-14 MED ORDER — IOPAMIDOL (ISOVUE-300) INJECTION 61%
100.0000 mL | Freq: Once | INTRAVENOUS | Status: AC | PRN
  Administered 2017-10-14: 100 mL via INTRAVENOUS

## 2017-10-14 MED ORDER — SODIUM CHLORIDE 0.9 % IV BOLUS
1000.0000 mL | Freq: Once | INTRAVENOUS | Status: AC
  Administered 2017-10-14: 1000 mL via INTRAVENOUS

## 2017-10-14 MED ORDER — KETOROLAC TROMETHAMINE 15 MG/ML IJ SOLN
15.0000 mg | Freq: Once | INTRAMUSCULAR | Status: AC
  Administered 2017-10-14: 15 mg via INTRAVENOUS
  Filled 2017-10-14: qty 1

## 2017-10-14 NOTE — ED Provider Notes (Signed)
[facility] EMERGENCY DEPARTMENT Provider Note   CSN: [id number] Arrival date & time: 10/14/17  1503     History   Chief Complaint Chief Complaint  Patient presents with  . Fall    HPI Heather Barton is a 37 y.o. female.  37 yo F with a chief complaint of a fall.  This occurred yesterday.  The patient was walking down the stairs and she lost her balance and fell down a couple steps.  She states that she rolled down them and she struck her abdomen her chest wall and her head.  She think she lost consciousness for short amount of time.  This then resolved.  She had some mild pain but was able to get through the day woke up this morning and was much worse.  Describes the pain worse to the left upper quadrant and chest wall is a burning pain.  She took some medicines for it at home and said it made it mildly better.  She denies any vomiting.  Denies unilateral numbness or weakness.  Denies neck pain.  Complaining of some low back pain and some left hip pain.  The history is provided by the patient, the spouse and a relative.  Fall  This is a recurrent problem. The current episode started yesterday. The problem occurs constantly. The problem has been gradually worsening. Associated symptoms include abdominal pain and headaches. Pertinent negatives include no chest pain and no shortness of breath. The symptoms are aggravated by bending, twisting and walking. Nothing relieves the symptoms. She has tried nothing for the symptoms. The treatment provided no relief.    Past Medical History:  Diagnosis Date  . Bipolar 1 disorder ([place])   . Brain tumor (benign) ([place])   . Personality disorder ([place])   . PTSD (post-traumatic stress disorder)   . Seizures ([place])    last seizure 2 weeks ago  . Stroke [facility])     There are no active problems to display for this patient.   Past Surgical History:  Procedure Laterality Date  . LEG SURGERY    . TUBAL LIGATION    . TUMOR REMOVAL        OB History    Gravida  6   Para  2   Term      Preterm      AB  4   Living  2     SAB  4   TAB      Ectopic      Multiple      Live Births               Home Medications    Prior to Admission medications   Medication Sig Start Date End Date Taking? Authorizing Provider  ARIPiprazole (ABILIFY) 10 MG tablet Take 10 mg by mouth daily.    [provider]  diphenhydrAMINE (BENADRYL) 25 MG tablet Take 1 tablet (25 mg total) by mouth every 6 (six) hours. 04/14/15   Fredia Sorrow, MD  gabapentin (NEURONTIN) 300 MG capsule Take 600 mg by mouth 3 (three) times daily.     [provider]  ibuprofen (ADVIL,MOTRIN) 800 MG tablet Take 1 tablet (800 mg total) by mouth 3 (three) times daily. Patient not taking: Reported on 04/14/2015 12/30/14   Marella Chimes, PA-C  levETIRAcetam (KEPPRA) 1000 MG tablet Take 2,000 mg by mouth 2 (two) times daily.     [provider]  naproxen (NAPROSYN) 500 MG tablet Take 1 tablet (500  mg total) by mouth 2 (two) times daily. 06/18/16   Fredia Sorrow, MD  sertraline (ZOLOFT) 100 MG tablet Take 100 mg by mouth daily.    [provider]  traZODone (DESYREL) 100 MG tablet Take 100 mg by mouth at bedtime. Takes 4 tablets     400mg     [provider]    Family History History reviewed. No pertinent family history.  Social History Social History   Tobacco Use  . Smoking status: Current Every Day Smoker    Packs/day: 0.50    Types: Cigarettes  . Smokeless tobacco: Never Used  Substance Use Topics  . Alcohol use: No  . Drug use: No     Allergies   Sulfa antibiotics; Aripiprazole; Duloxetine hcl; and Vicodin [hydrocodone-acetaminophen]   Review of Systems Review of Systems  Constitutional: Negative for chills and fever.  HENT: Negative for congestion and rhinorrhea.   Eyes: Negative for redness and visual disturbance.  Respiratory: Negative for shortness of breath and wheezing.    Cardiovascular: Negative for chest pain and palpitations.  Gastrointestinal: Positive for abdominal pain. Negative for nausea and vomiting.  Genitourinary: Negative for dysuria and urgency.  Musculoskeletal: Positive for back pain. Negative for arthralgias and myalgias.  Skin: Negative for pallor and wound.  Neurological: Positive for headaches. Negative for dizziness.     Physical Exam Updated Vital Signs BP 109/79   Pulse 64   Temp 98.2 F (36.8 C) (Oral)   Resp 18   Ht 5\' 5"  (1.651 m)   Wt 77.1 kg   LMP 09/23/2017 (Approximate)   SpO2 96%   BMI 28.29 kg/m   Physical Exam  Constitutional: She is oriented to person, place, and time. She appears well-developed and well-nourished. No distress.  No signs of trauma, patient appears intoxicated.  HENT:  Head: Normocephalic and atraumatic.  Eyes: Pupils are equal, round, and reactive to light. EOM are normal.  Neck: Normal range of motion. Neck supple.  Cardiovascular: Normal rate and regular rhythm. Exam reveals no gallop and no friction rub.  No murmur heard. Pulmonary/Chest: Effort normal. She has no wheezes. She has no rales.  Abdominal: Soft. She exhibits no distension and no mass. There is tenderness. There is no guarding.  Musculoskeletal: She exhibits tenderness. She exhibits no edema.  Neurological: She is alert and oriented to person, place, and time.  Skin: Skin is warm and dry. She is not diaphoretic.  Psychiatric: She has a normal mood and affect. Her behavior is normal.  Nursing note and vitals reviewed.    ED Treatments / Results  Labs (all labs ordered are listed, but only abnormal results are displayed) Labs Reviewed  URINALYSIS, ROUTINE W REFLEX MICROSCOPIC - Abnormal; Notable for the following components:      Result Value   Color, Urine AMBER (*)    APPearance HAZY (*)    Specific Gravity, Urine >1.030 (*)    Bilirubin Urine MODERATE (*)    Ketones, ur 15 (*)    Leukocytes, UA TRACE (*)    All  other components within normal limits  URINALYSIS, MICROSCOPIC (REFLEX) - Abnormal; Notable for the following components:   Bacteria, UA FEW (*)    All other components within normal limits  PREGNANCY, URINE    EKG None  Radiology Ct Abdomen Pelvis W Contrast  Result Date: 10/14/2017 CLINICAL DATA:  Left flank and left lateral abdominal pain with low back pain. Fall downstairs yesterday. EXAM: CT ABDOMEN AND PELVIS WITH CONTRAST TECHNIQUE: Multidetector CT imaging  of the abdomen and pelvis was performed using the standard protocol following bolus administration of intravenous contrast. CONTRAST:  173mL ISOVUE-300 IOPAMIDOL (ISOVUE-300) INJECTION 61% COMPARISON:  CT abdomen pelvis dated April 14, 2017. FINDINGS: Lower chest: No acute abnormality. Hepatobiliary: No focal liver abnormality is seen. No gallstones, gallbladder wall thickening, or biliary dilatation. Pancreas: Unremarkable. No pancreatic ductal dilatation or surrounding inflammatory changes. Spleen: Normal in size without focal abnormality. Adrenals/Urinary Tract: The adrenal glands are unremarkable. Subcentimeter low-density lesion in the right kidney is too small to characterize. No renal or ureteral calculi. No hydronephrosis. The bladder is unremarkable for the degree of distention. Stomach/Bowel: Stomach is within normal limits. Appendix appears normal. No evidence of bowel wall thickening, distention, or inflammatory changes. Large amount of stool throughout the colon. Vascular/Lymphatic: No significant vascular findings are present. No enlarged abdominal or pelvic lymph nodes. Reproductive: Heterogeneous uterus likely reflects underlying fibroids. Multiple nabothian cysts. Left corpus luteum. No adnexal mass. Other: No abdominal wall hernia or abnormality. No abdominopelvic ascites. No pneumoperitoneum. Musculoskeletal: No acute or significant osseous findings. IMPRESSION: 1. No acute traumatic injury within the abdomen or pelvis.  2.  Prominent stool throughout the colon favors constipation. Electronically Signed   By: Titus Dubin M.D.   On: 10/14/2017 17:04   Ct L-spine No Charge  Result Date: 10/14/2017 CLINICAL DATA:  Low back pain after fall. EXAM: CT LUMBAR SPINE WITHOUT CONTRAST TECHNIQUE: Multidetector CT imaging of the lumbar spine was performed without intravenous contrast administration. Multiplanar CT image reconstructions were also generated. COMPARISON:  04/14/2017 CT abdomen and pelvis FINDINGS: Segmentation: There are 5 non ribbed lumbar type vertebral bodies Alignment: Normal Vertebrae: Small sclerotic density of the right sacral ala compatible with a bone island. Similar findings within the L5 vertebral body. No acute lumbosacral fracture or suspicious osseous lesions. No listhesis or pars defects. Paraspinal and other soft tissues: Significant stool retention throughout the included colon. No significant small bowel obstruction or inflammation. No free air. No organomegaly. No radiopaque calculi. Nonaneurysmal abdominal aorta. Disc levels: Included T7 through L1 discs: Negative L1-L2: Negative for disc herniation, central canal or foraminal stenosis. L2-L3: Negative for disc herniation, central canal or foraminal stenosis. L3-L4:  Negative L4-L5: Negative for disc herniation, central or foraminal stenosis. Mild facet arthropathy. L5-S1:  Negative IMPRESSION: No acute lumbar spine fracture or listhesis. Ancillary findings as above including significant stool retention throughout the colon query constipation. Electronically Signed   By: Ashley Royalty M.D.   On: 10/14/2017 17:10    Procedures Procedures (including critical care time)  Medications Ordered in ED Medications  sodium chloride 0.9 % bolus 1,000 mL (0 mLs Intravenous Stopped 10/14/17 1724)  ketorolac (TORADOL) 15 MG/ML injection 15 mg (15 mg Intravenous Given 10/14/17 1548)  iopamidol (ISOVUE-300) 61 % injection 100 mL (100 mLs Intravenous Contrast  Given 10/14/17 1630)     Initial Impression / Assessment and Plan / ED Course  I have reviewed the triage vital signs and the nursing notes.  Pertinent labs & imaging results that were available during my care of the patient were reviewed by me and considered in my medical decision making (see chart for details).     37 yo F with a chief complaints of a fall down some stairs.  She states she slipped on the steps.  Complaining of pain all over.  This happened yesterday.  Patient has a history of opiate abuse and is clinically presenting like she has taken opiates.  Family states that sometimes this just  happens, states that her gabapentin makes her do this.  She has no signs of trauma.  I do not feel that I need to image her head, no neurologic deficit.  Greater than 24 hours after the insult.  She is hypertensive, I suspect this is secondary to her opiate use.  Will obtain a CT of the abdomen pelvis and of the L-spine as the patient is complaining of pain there and is hypotensive.  The patient's blood pressure is improved with IV fluids.  Her CT scan is negative for acute pathology.  No acute fractures in the back.  Discharge the patient home.  Have her follow-up with her PCP.  5:28 PM:  I have discussed the diagnosis/risks/treatment options with the patient and family and believe the pt to be eligible for discharge home to follow-up with PCP. We also discussed returning to the ED immediately if new or worsening sx occur. We discussed the sx which are most concerning (e.g., sudden worsening pain, fever, inability to tolerate by mouth) that necessitate immediate return. Medications administered to the patient during their visit and any new prescriptions provided to the patient are listed below.  Medications given during this visit Medications  sodium chloride 0.9 % bolus 1,000 mL (0 mLs Intravenous Stopped 10/14/17 1724)  ketorolac (TORADOL) 15 MG/ML injection 15 mg (15 mg Intravenous Given 10/14/17  1548)  iopamidol (ISOVUE-300) 61 % injection 100 mL (100 mLs Intravenous Contrast Given 10/14/17 1630)      The patient appears reasonably screen and/or stabilized for discharge and I doubt any other medical condition or other [facility] requiring further screening, evaluation, or treatment in the ED at this time prior to discharge.    Final Clinical Impressions(s) / ED Diagnoses   Final diagnoses:  Fall, initial encounter  LUQ abdominal pain    ED Discharge Orders    None       Deno Etienne, DO 10/14/17 1728

## 2017-10-14 NOTE — ED Triage Notes (Signed)
Reports fall yesterday after slipping.  Reports pain to left flank.  Also reports pain to head.  Possible LOC.

## 2017-10-14 NOTE — ED Notes (Signed)
Pt. Here today because her L side is painful from a fall last night per Pt.  Pt. Very sleepy.  Pt.  Alert at times and then falls back asleep.  Pt. Reports to RN I may have a seizure.  My daughter can tell you if Im going to have one she knows the signs.

## 2017-10-14 NOTE — Discharge Instructions (Signed)
Follow up with your PCP.  ° °Take 4 over the counter ibuprofen tablets 3 times a day or 2 over-the-counter naproxen tablets twice a day for pain. °Also take tylenol 1000mg(2 extra strength) four times a day.  ° ° °

## 2017-10-14 NOTE — ED Notes (Signed)
Pt. Has no bruising on the L side at site of pain on the L rib area.  Pt. Wanting to drink her water and eat.  RN told her no for now.

## 2018-07-16 ENCOUNTER — Emergency Department (HOSPITAL_BASED_OUTPATIENT_CLINIC_OR_DEPARTMENT_OTHER)
Admission: EM | Admit: 2018-07-16 | Discharge: 2018-07-16 | Disposition: A | Discharge status: Home / Self Care | Payer: Medicaid Other | Attending: Emergency Medicine | Admitting: Emergency Medicine

## 2018-07-16 ENCOUNTER — Other Ambulatory Visit: Payer: Self-pay

## 2018-07-16 DIAGNOSIS — Y999 Unspecified external cause status: Secondary | ICD-10-CM | POA: Insufficient documentation

## 2018-07-16 DIAGNOSIS — Z79899 Other long term (current) drug therapy: Secondary | ICD-10-CM | POA: Diagnosis not present

## 2018-07-16 DIAGNOSIS — S80869A Insect bite (nonvenomous), unspecified lower leg, initial encounter: Secondary | ICD-10-CM | POA: Diagnosis not present

## 2018-07-16 DIAGNOSIS — W57XXXA Bitten or stung by nonvenomous insect and other nonvenomous arthropods, initial encounter: Secondary | ICD-10-CM | POA: Diagnosis not present

## 2018-07-16 DIAGNOSIS — Y929 Unspecified place or not applicable: Secondary | ICD-10-CM | POA: Insufficient documentation

## 2018-07-16 DIAGNOSIS — Y939 Activity, unspecified: Secondary | ICD-10-CM | POA: Insufficient documentation

## 2018-07-16 DIAGNOSIS — F17210 Nicotine dependence, cigarettes, uncomplicated: Secondary | ICD-10-CM | POA: Diagnosis not present

## 2018-07-16 DIAGNOSIS — Z8673 Personal history of transient ischemic attack (TIA), and cerebral infarction without residual deficits: Secondary | ICD-10-CM | POA: Diagnosis not present

## 2018-07-16 DIAGNOSIS — L299 Pruritus, unspecified: Secondary | ICD-10-CM | POA: Diagnosis present

## 2018-07-16 MED ORDER — PERMETHRIN 5 % EX CREA: TOPICAL_CREAM | CUTANEOUS | 0 refills | Status: AC

## 2018-07-16 NOTE — ED Triage Notes (Signed)
Pt c/o exposure to [place] bug. Pt stats she was exposed by a person from [place]. Denies fever, c/o headache and bumps/rash to legs torso and arms

## 2018-07-16 NOTE — Discharge Instructions (Signed)
You were seen in the ED today for insect bites all over your body; this may be scabies although it is difficult to determine; please use permethrin cream as prescribed; please also wash all of your sheets and towel. I have provided enough cream to use for yourself as well as your son. Please follow up with PCP.

## 2018-07-16 NOTE — ED Notes (Signed)
Pt verbalized understanding of dc instructions.

## 2018-07-16 NOTE — ED Provider Notes (Signed)
[facility] EMERGENCY DEPARTMENT Provider Note   CSN: [id number] Arrival date & time: 07/16/18  1336    History   Chief Complaint Chief Complaint  Patient presents with  . Insect Bite    HPI Heather Barton is a 38 y.o. female who presents to the ED complaining of insect bites diffusely to body. Pt reports that she was helping out a friend and let them stay at her house; pt states that this person had all of her belongings in pt's car and after that she and her husband began having itching all over after they had intercourse in the car. They believe it may be due to [place] bug because this person went to [place] prior to staying with them; they googled their symptoms and google suggested they may have this bug. Pt has been scratching at the areas excessively as well. Denies fever, chills, drainage, or any other associated symptoms.        Past Medical History:  Diagnosis Date  . Bipolar 1 disorder ([place])   . Brain tumor (benign) ([place])   . Personality disorder ([place])   . PTSD (post-traumatic stress disorder)   . Seizures ([place])    last seizure 2 weeks ago  . Stroke [facility])     There are no active problems to display for this patient.   Past Surgical History:  Procedure Laterality Date  . LEG SURGERY    . TUBAL LIGATION    . TUMOR REMOVAL       OB History    Gravida  6   Para  2   Term      Preterm      AB  4   Living  2     SAB  4   TAB      Ectopic      Multiple      Live Births               Home Medications    Prior to Admission medications   Medication Sig Start Date End Date Taking? Authorizing Provider  busPIRone (BUSPAR) 5 MG tablet TAKE 1 TABLET BY MOUTH THREE TIMES A DAY 06/06/18  Yes [provider]  naloxone (NARCAN) 2 MG/2ML injection Inject into the muscle. 07/21/17  Yes [provider]  ARIPiprazole (ABILIFY) 10 MG tablet Take 10 mg by mouth daily.    [provider]  diphenhydrAMINE (BENADRYL)  25 MG tablet Take 1 tablet (25 mg total) by mouth every 6 (six) hours. 04/14/15   Fredia Sorrow, MD  gabapentin (NEURONTIN) 300 MG capsule Take 600 mg by mouth 3 (three) times daily.     [provider]  ibuprofen (ADVIL,MOTRIN) 800 MG tablet Take 1 tablet (800 mg total) by mouth 3 (three) times daily. Patient not taking: Reported on 04/14/2015 12/30/14   Marella Chimes, PA-C  levETIRAcetam (KEPPRA) 1000 MG tablet Take 2,000 mg by mouth 2 (two) times daily.     [provider]  methadone (DOLOPHINE) 10 MG tablet Take by mouth.    [provider]  naproxen (NAPROSYN) 500 MG tablet Take 1 tablet (500 mg total) by mouth 2 (two) times daily. 06/18/16   Fredia Sorrow, MD  omeprazole (PRILOSEC) 40 MG capsule Take by mouth daily. 06/24/18   [provider]  permethrin (ELIMITE) 5 % cream Apply to entire body once and leave on for 8-12 hours before washing off 07/16/18   Alroy Bailiff, Rainn Bullinger, PA-C  sertraline (ZOLOFT) 100 MG  tablet Take 100 mg by mouth daily.    [provider]  traZODone (DESYREL) 100 MG tablet Take 100 mg by mouth at bedtime. Takes 4 tablets     400mg     [provider]    Family History No family history on file.  Social History Social History   Tobacco Use  . Smoking status: Current Every Day Smoker    Packs/day: 0.50    Types: Cigarettes  . Smokeless tobacco: Never Used  Substance Use Topics  . Alcohol use: No  . Drug use: No     Allergies   Sulfa antibiotics; Aripiprazole; Duloxetine hcl; and Vicodin [hydrocodone-acetaminophen]   Review of Systems Review of Systems  Constitutional: Negative for chills and fever.  Skin: Positive for rash.     Physical Exam Updated Vital Signs BP 102/75 (BP Location: Left Arm)   Pulse (!) 114   Temp 98.3 F (36.8 C)   Resp 20   Ht 5\' 6"  (1.676 m)   Wt 74.8 kg   SpO2 100%   BMI 26.63 kg/m   Physical Exam Vitals signs and nursing note reviewed.  Constitutional:       Appearance: She is not ill-appearing.  HENT:     Head: Normocephalic and atraumatic.  Eyes:     Conjunctiva/sclera: Conjunctivae normal.  Cardiovascular:     Rate and Rhythm: Normal rate and regular rhythm.  Pulmonary:     Effort: Pulmonary effort is normal.     Breath sounds: Normal breath sounds.  Skin:    General: Skin is warm and dry.     Coloration: Skin is not jaundiced.     Comments: Multiple excoriations to body specifically on lower legs as well as neck; no erythema or drainage; no signs of cellulitis; no burrowing marks to web spaces  Neurological:     Mental Status: She is alert.      ED Treatments / Results  Labs (all labs ordered are listed, but only abnormal results are displayed) Labs Reviewed - No data to display  EKG None  Radiology No results found.  Procedures Procedures (including critical care time)  Medications Ordered in ED Medications - No data to display   Initial Impression / Assessment and Plan / ED Course  I have reviewed the triage vital signs and the nursing notes.  Pertinent labs & imaging results that were available during my care of the patient were reviewed by me and considered in my medical decision making (see chart for details).    Pt is a 38 year old female who presents with itching to body; originally came in concerned for [place] infection; no known exposure to this insect; no obvious swelling to joints or eye complaints; discussed with patient that this could be bed bugs as she has an area of linear excoriations to her RLE; patient did not agree with this; her husband is having the same symptoms; patient also brought her son in to be evaluated; no signs of burrowing; no signs of infection at this point; pt is afebrile; do not feel this is a life threatening emergency; will prescribe permethrin cream to see if this helps with the itching as I cannot rule out scabies at this point in time; will have patient follow up with her PCP.  Advised to wash all of her bed sheets and towels as well.       Final Clinical Impressions(s) / ED Diagnoses   Final diagnoses:  Insect bite, unspecified site, initial encounter  ED Discharge Orders         Ordered    permethrin (ELIMITE) 5 % cream     07/16/18 1458           Eustaquio Maize, PA-C 07/16/18 1720    Quintella Reichert, MD 07/17/18 [phone]

## 2018-07-24 ENCOUNTER — Encounter (HOSPITAL_BASED_OUTPATIENT_CLINIC_OR_DEPARTMENT_OTHER): Payer: Self-pay

## 2018-07-24 ENCOUNTER — Emergency Department (HOSPITAL_BASED_OUTPATIENT_CLINIC_OR_DEPARTMENT_OTHER)
Admission: EM | Admit: 2018-07-24 | Discharge: 2018-07-24 | Disposition: A | Discharge status: Home / Self Care | Payer: Medicaid Other | Attending: Emergency Medicine | Admitting: Emergency Medicine

## 2018-07-24 ENCOUNTER — Other Ambulatory Visit: Payer: Self-pay

## 2018-07-24 DIAGNOSIS — F319 Bipolar disorder, unspecified: Secondary | ICD-10-CM | POA: Diagnosis not present

## 2018-07-24 DIAGNOSIS — Z79899 Other long term (current) drug therapy: Secondary | ICD-10-CM | POA: Diagnosis not present

## 2018-07-24 DIAGNOSIS — W57XXXA Bitten or stung by nonvenomous insect and other nonvenomous arthropods, initial encounter: Secondary | ICD-10-CM | POA: Insufficient documentation

## 2018-07-24 DIAGNOSIS — Z8673 Personal history of transient ischemic attack (TIA), and cerebral infarction without residual deficits: Secondary | ICD-10-CM | POA: Insufficient documentation

## 2018-07-24 DIAGNOSIS — F17210 Nicotine dependence, cigarettes, uncomplicated: Secondary | ICD-10-CM | POA: Diagnosis not present

## 2018-07-24 DIAGNOSIS — R21 Rash and other nonspecific skin eruption: Secondary | ICD-10-CM | POA: Insufficient documentation

## 2018-07-24 MED ORDER — DOXYCYCLINE HYCLATE 100 MG PO CAPS: 200.0000 mg | ORAL_CAPSULE | Freq: Every day | ORAL | 0 refills | Status: AC

## 2018-07-24 NOTE — ED Triage Notes (Signed)
Pt reports "bugs inside of me"- pressure behind eyes, skin itching. No visible bumps or rashes. Significant other here with the same.

## 2018-07-24 NOTE — ED Provider Notes (Signed)
[facility] EMERGENCY DEPARTMENT Provider Note   CSN: [id number] Arrival date & time: 07/24/18  1542    History   Chief Complaint Chief Complaint  Patient presents with  . skin irritation    HPI Heather Barton is a 38 y.o. female.     Patient is here with concern for parasite infection.  The history is provided by the patient.  Illness  Severity:  Mild Onset quality:  Gradual Timing:  Intermittent Progression:  Waxing and waning Chronicity:  Recurrent Context:  Think may have loa loa or river blindness. No travel history. [place].  Associated symptoms: rash (itchy)   Associated symptoms: no abdominal pain, no chest pain, no cough, no ear pain, no fever, no shortness of breath, no sore throat and no vomiting     Past Medical History:  Diagnosis Date  . Bipolar 1 disorder ([place])   . Brain tumor (benign) ([place])   . Personality disorder ([place])   . PTSD (post-traumatic stress disorder)   . Seizures ([place])    last seizure 2 weeks ago  . Stroke [facility])     There are no active problems to display for this patient.   Past Surgical History:  Procedure Laterality Date  . LEG SURGERY    . TUBAL LIGATION    . TUMOR REMOVAL       OB History    Gravida  6   Para  2   Term      Preterm      AB  4   Living  2     SAB  4   TAB      Ectopic      Multiple      Live Births               Home Medications    Prior to Admission medications   Medication Sig Start Date End Date Taking? Authorizing Provider  ARIPiprazole (ABILIFY) 10 MG tablet Take 10 mg by mouth daily.    [provider]  busPIRone (BUSPAR) 5 MG tablet TAKE 1 TABLET BY MOUTH THREE TIMES A DAY 06/06/18   [provider]  diphenhydrAMINE (BENADRYL) 25 MG tablet Take 1 tablet (25 mg total) by mouth every 6 (six) hours. 04/14/15   Fredia Sorrow, MD  doxycycline (VIBRAMYCIN) 100 MG capsule Take 2 capsules (200 mg total) by mouth daily  for 14 days. 07/24/18 08/07/18  Kysa Calais, DO  gabapentin (NEURONTIN) 300 MG capsule Take 600 mg by mouth 3 (three) times daily.     [provider]  ibuprofen (ADVIL,MOTRIN) 800 MG tablet Take 1 tablet (800 mg total) by mouth 3 (three) times daily. Patient not taking: Reported on 04/14/2015 12/30/14   Marella Chimes, PA-C  levETIRAcetam (KEPPRA) 1000 MG tablet Take 2,000 mg by mouth 2 (two) times daily.     [provider]  methadone (DOLOPHINE) 10 MG tablet Take by mouth.    [provider]  naloxone [facility]) 2 MG/2ML injection Inject into the muscle. 07/21/17   [provider]  naproxen (NAPROSYN) 500 MG tablet Take 1 tablet (500 mg total) by mouth 2 (two) times daily. 06/18/16   Fredia Sorrow, MD  omeprazole (PRILOSEC) 40 MG capsule Take by mouth daily. 06/24/18   [provider]  permethrin (ELIMITE) 5 % cream Apply to entire body once and leave on for 8-12 hours before washing off 07/16/18   Alroy Bailiff, Margaux, PA-C  sertraline (ZOLOFT)  100 MG tablet Take 100 mg by mouth daily.    [provider]  traZODone (DESYREL) 100 MG tablet Take 100 mg by mouth at bedtime. Takes 4 tablets     400mg     [provider]    Family History No family history on file.  Social History Social History   Tobacco Use  . Smoking status: Current Every Day Smoker    Packs/day: 0.50    Types: Cigarettes  . Smokeless tobacco: Never Used  Substance Use Topics  . Alcohol use: No  . Drug use: No     Allergies   Sulfa antibiotics; Aripiprazole; Duloxetine hcl; and Vicodin [hydrocodone-acetaminophen]   Review of Systems Review of Systems  Constitutional: Negative for chills and fever.  HENT: Negative for ear pain and sore throat.   Eyes: Negative for pain and visual disturbance.  Respiratory: Negative for cough and shortness of breath.   Cardiovascular: Negative for chest pain and palpitations.  Gastrointestinal: Negative for abdominal  pain and vomiting.  Genitourinary: Negative for dysuria and hematuria.  Musculoskeletal: Negative for arthralgias and back pain.  Skin: Positive for color change and rash (itchy).  Neurological: Negative for seizures and syncope.  All other systems reviewed and are negative.    Physical Exam Updated Vital Signs  ED Triage Vitals  Enc Vitals Group     BP 07/24/18 1555 116/86     Pulse Rate 07/24/18 1555 74     Resp 07/24/18 1555 17     Temp 07/24/18 1555 98.4 F (36.9 C)     Temp Source 07/24/18 1555 Oral     SpO2 07/24/18 1555 99 %     Weight 07/24/18 1549 165 lb (74.8 kg)     Height 07/24/18 1549 5\' 6"  (1.676 m)     Head Circumference --      Peak Flow --      Pain Score 07/24/18 1549 8     Pain Loc --      Pain Edu? --      Excl. in [place]? --     Physical Exam Vitals signs and nursing note reviewed.  Constitutional:      General: She is not in acute distress.    Appearance: She is well-developed.  HENT:     Head: Normocephalic and atraumatic.  Eyes:     Extraocular Movements: Extraocular movements intact.     Conjunctiva/sclera: Conjunctivae normal.     Pupils: Pupils are equal, round, and reactive to light.  Neck:     Musculoskeletal: Neck supple.  Cardiovascular:     Rate and Rhythm: Normal rate and regular rhythm.     Pulses: Normal pulses.     Heart sounds: Normal heart sounds. No murmur.  Pulmonary:     Effort: Pulmonary effort is normal. No respiratory distress.     Breath sounds: Normal breath sounds.  Abdominal:     Palpations: Abdomen is soft.     Tenderness: There is no abdominal tenderness.  Skin:    General: Skin is warm and dry.     Findings: No rash.  Neurological:     Mental Status: She is alert.      ED Treatments / Results  Labs (all labs ordered are listed, but only abnormal results are displayed) Labs Reviewed - No data to display  EKG None  Radiology No results found.  Procedures Procedures (including critical care time)   Medications Ordered in ED Medications - No data to display   Initial  Impression / Assessment and Plan / ED Course  I have reviewed the triage vital signs and the nursing notes.  Pertinent labs & imaging results that were available during my care of the patient were reviewed by me and considered in my medical decision making (see chart for details).     Heather Barton is a 38 year old female with history of bipolar disorder who presents the ED with concern for lao/loa infection or possibly river blindness.  Patient with unremarkable vitals.  States that he had a possible exposure about a year ago.  He states that he has noticed bugs come out of his stomach and sometimes seeing things floating in his eyes.  Denies any vision changes.  Patient has had multiple ED visits for the same.   Overall she appears well.  After long conversation with the patient and her partner who is also here for similar symptoms, I believe that starting empiric treatment with doxycycline is reasonable.  I believe that they are seeking further expertise which I cannot provide and therefore we will send him to infectious disease for further education and possible diagnosis.  There does not appear to be any emergent findings at this time.  I believe doxycycline is a safe start for treatment at this time as according to up-to-date that if these patients that have this disease this would be the initial treatment.  Recommend that they get a follow-up with infectious disease as if they do have this disease they would need further ongoing treatment.  Discharged from the ED in good condition.  Given return precautions.  This chart was dictated using voice recognition software.  Despite best efforts to proofread,  errors can occur which can change the documentation meaning.    Final Clinical Impressions(s) / ED Diagnoses   Final diagnoses:  Bug bite, initial encounter    ED Discharge Orders         Ordered    doxycycline  (VIBRAMYCIN) 100 MG capsule  Daily     07/24/18 1619           Lennice Sites, DO 07/24/18 1626

## 2018-09-28 ENCOUNTER — Emergency Department (HOSPITAL_BASED_OUTPATIENT_CLINIC_OR_DEPARTMENT_OTHER)
Admission: EM | Admit: 2018-09-28 | Discharge: 2018-09-28 | Disposition: A | Discharge status: Home / Self Care | Payer: Medicaid Other | Attending: Emergency Medicine | Admitting: Emergency Medicine

## 2018-09-28 ENCOUNTER — Emergency Department (HOSPITAL_BASED_OUTPATIENT_CLINIC_OR_DEPARTMENT_OTHER): Payer: Medicaid Other

## 2018-09-28 ENCOUNTER — Encounter (HOSPITAL_BASED_OUTPATIENT_CLINIC_OR_DEPARTMENT_OTHER): Payer: Self-pay | Admitting: Emergency Medicine

## 2018-09-28 ENCOUNTER — Other Ambulatory Visit: Payer: Self-pay

## 2018-09-28 DIAGNOSIS — Z8673 Personal history of transient ischemic attack (TIA), and cerebral infarction without residual deficits: Secondary | ICD-10-CM | POA: Insufficient documentation

## 2018-09-28 DIAGNOSIS — W208XXA Other cause of strike by thrown, projected or falling object, initial encounter: Secondary | ICD-10-CM | POA: Diagnosis not present

## 2018-09-28 DIAGNOSIS — Z79899 Other long term (current) drug therapy: Secondary | ICD-10-CM | POA: Diagnosis not present

## 2018-09-28 DIAGNOSIS — S9031XA Contusion of right foot, initial encounter: Secondary | ICD-10-CM | POA: Diagnosis not present

## 2018-09-28 DIAGNOSIS — Y9389 Activity, other specified: Secondary | ICD-10-CM | POA: Insufficient documentation

## 2018-09-28 DIAGNOSIS — Y929 Unspecified place or not applicable: Secondary | ICD-10-CM | POA: Insufficient documentation

## 2018-09-28 DIAGNOSIS — F17210 Nicotine dependence, cigarettes, uncomplicated: Secondary | ICD-10-CM | POA: Insufficient documentation

## 2018-09-28 DIAGNOSIS — S99921A Unspecified injury of right foot, initial encounter: Secondary | ICD-10-CM | POA: Diagnosis present

## 2018-09-28 DIAGNOSIS — Y998 Other external cause status: Secondary | ICD-10-CM | POA: Diagnosis not present

## 2018-09-28 HISTORY — DX: Other psychoactive substance abuse, uncomplicated: F19.10

## 2018-09-28 MED ORDER — LIDOCAINE 5 % EX PTCH: 1.0000 | MEDICATED_PATCH | CUTANEOUS | 0 refills | Status: DC

## 2018-09-28 MED ORDER — NAPROXEN 375 MG PO TABS: 375.0000 mg | ORAL_TABLET | Freq: Two times a day (BID) | ORAL | 0 refills | Status: DC

## 2018-09-28 MED ORDER — NAPROXEN 250 MG PO TABS
500.0000 mg | ORAL_TABLET | Freq: Once | ORAL | Status: AC
  Administered 2018-09-28: 02:00:00 500 mg via ORAL
  Filled 2018-09-28: qty 2

## 2018-09-28 NOTE — ED Notes (Signed)
Pt appears very drowsy and slow to answer questions. Pt given blanket and has ice pack on foot.

## 2018-09-28 NOTE — ED Notes (Signed)
Patient transported to X-ray 

## 2018-09-28 NOTE — ED Triage Notes (Signed)
Pt states she dropped a 30 lb weight on her foot 45 min pta.

## 2018-09-28 NOTE — ED Provider Notes (Signed)
[facility] EMERGENCY DEPARTMENT Provider Note   CSN: [id number] Arrival date & time: 09/28/18  0114     History   Chief Complaint Chief Complaint  Patient presents with   Foot Pain    HPI Heather Barton is a 38 y.o. female.     The history is provided by the patient.  Foot Pain This is a new problem. The current episode started 1 to 2 hours ago. The problem occurs constantly. The problem has not changed since onset.Pertinent negatives include no chest pain, no abdominal pain, no headaches and no shortness of breath. Nothing aggravates the symptoms. Nothing relieves the symptoms. She has tried nothing for the symptoms. The treatment provided no relief.  Patient reports dropping part of a 30 lb weight on foot while exercising.  Did not hit head, no LOC.  Small bruise on dorsum of the right foot.    Past Medical History:  Diagnosis Date   Bipolar 1 disorder ([place])    Brain tumor (benign) ([place])    Personality disorder ([place])    PTSD (post-traumatic stress disorder)    Seizures ([place])    last seizure 2 weeks ago   Stroke [facility])    Substance abuse ([place])     There are no active problems to display for this patient.   Past Surgical History:  Procedure Laterality Date   LEG SURGERY     TUBAL LIGATION     TUMOR REMOVAL       OB History    Gravida  6   Para  2   Term      Preterm      AB  4   Living  2     SAB  4   TAB      Ectopic      Multiple      Live Births               Home Medications    Prior to Admission medications   Medication Sig Start Date End Date Taking? Authorizing Provider  ARIPiprazole (ABILIFY) 10 MG tablet Take 10 mg by mouth daily.    [provider]  busPIRone (BUSPAR) 5 MG tablet TAKE 1 TABLET BY MOUTH THREE TIMES A DAY 06/06/18   [provider]  diphenhydrAMINE (BENADRYL) 25 MG tablet Take 1 tablet (25 mg total) by mouth every 6 (six) hours. 04/14/15   Fredia Sorrow, MD  gabapentin  (NEURONTIN) 300 MG capsule Take 600 mg by mouth 3 (three) times daily.     [provider]  ibuprofen (ADVIL,MOTRIN) 800 MG tablet Take 1 tablet (800 mg total) by mouth 3 (three) times daily. Patient not taking: Reported on 04/14/2015 12/30/14   Marella Chimes, PA-C  levETIRAcetam (KEPPRA) 1000 MG tablet Take 2,000 mg by mouth 2 (two) times daily.     [provider]  lidocaine (LIDODERM) 5 % Place 1 patch onto the skin daily. Remove & Discard patch within 12 hours or as directed by MD 09/28/18   Randal Buba, Yisel Megill, MD  methadone (DOLOPHINE) 10 MG tablet Take by mouth.    [provider]  naloxone [facility]) 2 MG/2ML injection Inject into the muscle. 07/21/17   [provider]  naproxen (NAPROSYN) 375 MG tablet Take 1 tablet (375 mg total) by mouth 2 (two) times daily with a meal. 09/28/18   Ryley Teater, MD  naproxen (NAPROSYN) 500 MG tablet Take 1 tablet (500 mg total) by mouth 2 (two) times daily. 06/18/16  Fredia Sorrow, MD  omeprazole (PRILOSEC) 40 MG capsule Take by mouth daily. 06/24/18   [provider]  permethrin (ELIMITE) 5 % cream Apply to entire body once and leave on for 8-12 hours before washing off 07/16/18   Alroy Bailiff, Margaux, PA-C  sertraline (ZOLOFT) 100 MG tablet Take 100 mg by mouth daily.    [provider]  traZODone (DESYREL) 100 MG tablet Take 100 mg by mouth at bedtime. Takes 4 tablets     400mg     [provider]    Family History History reviewed. No pertinent family history.  Social History Social History   Tobacco Use   Smoking status: Current Every Day Smoker    Packs/day: 0.50    Types: Cigarettes   Smokeless tobacco: Never Used  Substance Use Topics   Alcohol use: No   Drug use: No     Allergies   Sulfa antibiotics, Aripiprazole, Duloxetine hcl, and Vicodin [hydrocodone-acetaminophen]   Review of Systems Review of Systems  Constitutional: Negative for fever.  HENT: Negative for trouble  swallowing.   Eyes: Negative for visual disturbance.  Respiratory: Negative for shortness of breath.   Cardiovascular: Negative for chest pain.  Gastrointestinal: Negative for abdominal pain.  Genitourinary: Negative for dysuria.  Musculoskeletal: Positive for arthralgias. Negative for back pain.  Neurological: Negative for weakness, numbness and headaches.  Hematological: Negative for adenopathy.  Psychiatric/Behavioral: Negative for agitation.  All other systems reviewed and are negative.    Physical Exam Updated Vital Signs BP (!) 103/48 (BP Location: Right Arm)    Pulse (!) 57    Temp 97.9 F (36.6 C) (Oral)    Resp 14    Ht 5\' 6"  (1.676 m)    Wt 81.6 kg    SpO2 97%    BMI 29.05 kg/m   Physical Exam Vitals signs and nursing note reviewed.  Constitutional:      General: She is not in acute distress.    Appearance: She is obese. She is not ill-appearing.  HENT:     Head: Normocephalic and atraumatic.     Nose: Nose normal.  Eyes:     Conjunctiva/sclera: Conjunctivae normal.     Pupils: Pupils are equal, round, and reactive to light.  Neck:     Musculoskeletal: Normal range of motion and neck supple.  Cardiovascular:     Rate and Rhythm: Normal rate and regular rhythm.     Pulses: Normal pulses.     Heart sounds: Normal heart sounds.  Pulmonary:     Effort: Pulmonary effort is normal.     Breath sounds: Normal breath sounds.  Abdominal:     General: Abdomen is flat. Bowel sounds are normal.     Tenderness: There is no abdominal tenderness.  Musculoskeletal: Normal range of motion.     Right ankle: Normal.     Right foot: Normal range of motion and normal capillary refill. No tenderness, bony tenderness, swelling, crepitus, deformity or laceration.       Feet:  Neurological:     Mental Status: She is alert.      ED Treatments / Results  Labs (all labs ordered are listed, but only abnormal results are displayed) Labs Reviewed - No data to  display  EKG None  Radiology Dg Foot Complete Right  Result Date: 09/28/2018 CLINICAL DATA:  Dropped weight on right foot.  Pain. EXAM: RIGHT FOOT COMPLETE - 3+ VIEW COMPARISON:  None. FINDINGS: There is no evidence of fracture or dislocation. There is  no evidence of arthropathy or other focal bone abnormality. Soft tissues are unremarkable. IMPRESSION: Negative. Electronically Signed   By: Rolm Baptise M.D.   On: 09/28/2018 02:00    Procedures Procedures (including critical care time)  Medications Ordered in ED Medications  naproxen (NAPROSYN) tablet 500 mg (500 mg Oral Given 09/28/18 0142)     RICE therapy.    Final Clinical Impressions(s) / ED Diagnoses   Final diagnoses:  Contusion of right foot, initial encounter   Return for intractable cough, coughing up blood,fevers >100.4 unrelieved by medication, shortness of breath, intractable vomiting, chest pain, shortness of breath, weakness,numbness, changes in speech, facial asymmetry,abdominal pain, passing out,Inability to tolerate liquids or food, cough, altered mental status or any concerns. No signs of systemic illness or infection. The patient is nontoxic-appearing on exam and vital signs are within normal limits.   I have reviewed the triage vital signs and the nursing notes. Pertinent labs &imaging results that were available during my care of the patient were reviewed by me and considered in my medical decision making (see chart for details).  After history, exam, and medical workup I feel the patient has been appropriately medically screened and is safe for discharge home. Pertinent diagnoses were discussed with the patient. Patient was given return precautions  ED Discharge Orders         Ordered    naproxen (NAPROSYN) 375 MG tablet  2 times daily with meals     09/28/18 0204    lidocaine (LIDODERM) 5 %  Every 24 hours     09/28/18 0204           Shanen Norris, MD 09/28/18 [id number]

## 2018-09-29 ENCOUNTER — Encounter (HOSPITAL_BASED_OUTPATIENT_CLINIC_OR_DEPARTMENT_OTHER): Payer: Self-pay | Admitting: *Deleted

## 2018-09-29 ENCOUNTER — Other Ambulatory Visit: Payer: Self-pay

## 2018-09-29 ENCOUNTER — Emergency Department (HOSPITAL_BASED_OUTPATIENT_CLINIC_OR_DEPARTMENT_OTHER)
Admission: EM | Admit: 2018-09-29 | Discharge: 2018-09-29 | Disposition: A | Discharge status: Home / Self Care | Payer: Medicaid Other | Attending: Emergency Medicine | Admitting: Emergency Medicine

## 2018-09-29 DIAGNOSIS — L989 Disorder of the skin and subcutaneous tissue, unspecified: Secondary | ICD-10-CM | POA: Diagnosis not present

## 2018-09-29 DIAGNOSIS — Z5321 Procedure and treatment not carried out due to patient leaving prior to being seen by health care provider: Secondary | ICD-10-CM | POA: Insufficient documentation

## 2018-09-29 NOTE — ED Notes (Signed)
This RN walked to the room in order to assess pt, pt is not in the room and nowhere to be found in the department, it seems that she left the premises. Pt left without being  seen by provider

## 2018-09-29 NOTE — ED Triage Notes (Signed)
States her mother in law told her she had small insects coming out of her skin 45 minutes ago. No insects noted.

## 2018-11-05 ENCOUNTER — Emergency Department (HOSPITAL_BASED_OUTPATIENT_CLINIC_OR_DEPARTMENT_OTHER): Payer: Medicaid Other

## 2018-11-05 ENCOUNTER — Emergency Department (HOSPITAL_BASED_OUTPATIENT_CLINIC_OR_DEPARTMENT_OTHER)
Admission: EM | Admit: 2018-11-05 | Discharge: 2018-11-06 | Disposition: A | Discharge status: Home / Self Care | Payer: Medicaid Other | Attending: Emergency Medicine | Admitting: Emergency Medicine

## 2018-11-05 ENCOUNTER — Encounter (HOSPITAL_BASED_OUTPATIENT_CLINIC_OR_DEPARTMENT_OTHER): Payer: Self-pay

## 2018-11-05 ENCOUNTER — Other Ambulatory Visit: Payer: Self-pay

## 2018-11-05 DIAGNOSIS — R569 Unspecified convulsions: Secondary | ICD-10-CM | POA: Diagnosis not present

## 2018-11-05 DIAGNOSIS — F4312 Post-traumatic stress disorder, chronic: Secondary | ICD-10-CM | POA: Insufficient documentation

## 2018-11-05 DIAGNOSIS — F17210 Nicotine dependence, cigarettes, uncomplicated: Secondary | ICD-10-CM | POA: Insufficient documentation

## 2018-11-05 DIAGNOSIS — R072 Precordial pain: Secondary | ICD-10-CM | POA: Diagnosis not present

## 2018-11-05 DIAGNOSIS — Z79899 Other long term (current) drug therapy: Secondary | ICD-10-CM | POA: Diagnosis not present

## 2018-11-05 DIAGNOSIS — R51 Headache: Secondary | ICD-10-CM | POA: Insufficient documentation

## 2018-11-05 DIAGNOSIS — R531 Weakness: Secondary | ICD-10-CM | POA: Diagnosis not present

## 2018-11-05 DIAGNOSIS — R519 Headache, unspecified: Secondary | ICD-10-CM

## 2018-11-05 LAB — URINALYSIS, ROUTINE W REFLEX MICROSCOPIC
Bilirubin Urine: NEGATIVE
Glucose, UA: NEGATIVE mg/dL
Hgb urine dipstick: NEGATIVE
Ketones, ur: NEGATIVE mg/dL
Leukocytes,Ua: NEGATIVE
Nitrite: NEGATIVE
Protein, ur: NEGATIVE mg/dL
Specific Gravity, Urine: >1.03 — ABNORMAL HIGH (ref 1.005–1.030)
pH: 5.5 (ref 5.0–8.0)

## 2018-11-05 LAB — RAPID URINE DRUG SCREEN, HOSP PERFORMED
Amphetamines: POSITIVE — AB
Barbiturates: NOT DETECTED
Benzodiazepines: NOT DETECTED
Cocaine: NOT DETECTED
Opiates: NOT DETECTED
Tetrahydrocannabinol: NOT DETECTED

## 2018-11-05 LAB — CBC WITH DIFFERENTIAL/PLATELET
Abs Immature Granulocytes: 0.02 10*3/uL (ref 0.00–0.07)
Basophils Absolute: 0 10*3/uL (ref 0.0–0.1)
Basophils Relative: 0 %
Eosinophils Absolute: 0 10*3/uL (ref 0.0–0.5)
Eosinophils Relative: 0 %
HCT: 39.6 % (ref 36.0–46.0)
Hemoglobin: 12.6 g/dL (ref 12.0–15.0)
Immature Granulocytes: 0 %
Lymphocytes Relative: 31 %
Lymphs Abs: 2.9 10*3/uL (ref 0.7–4.0)
MCH: 29.7 pg (ref 26.0–34.0)
MCHC: 31.8 g/dL (ref 30.0–36.0)
MCV: 93.4 fL (ref 80.0–100.0)
Monocytes Absolute: 0.4 10*3/uL (ref 0.1–1.0)
Monocytes Relative: 4 %
Neutro Abs: 6.1 10*3/uL (ref 1.7–7.7)
Neutrophils Relative %: 65 %
Platelets: 272 10*3/uL (ref 150–400)
RBC: 4.24 MIL/uL (ref 3.87–5.11)
RDW: 13 % (ref 11.5–15.5)
WBC: 9.5 10*3/uL (ref 4.0–10.5)
nRBC: 0 % (ref 0.0–0.2)

## 2018-11-05 LAB — PREGNANCY, URINE: Preg Test, Ur: NEGATIVE

## 2018-11-05 LAB — ETHANOL: Alcohol, Ethyl (B): <10 mg/dL (ref <10)

## 2018-11-05 LAB — COMPREHENSIVE METABOLIC PANEL
ALT: 11 U/L (ref 0–44)
AST: 13 U/L — ABNORMAL LOW (ref 15–41)
Albumin: 2.6 g/dL — ABNORMAL LOW (ref 3.5–5.0)
Alkaline Phosphatase: 38 U/L (ref 38–126)
Anion gap: 7 (ref 5–15)
BUN: 15 mg/dL (ref 6–20)
CO2: 21 mmol/L — ABNORMAL LOW (ref 22–32)
Calcium: 7.6 mg/dL — ABNORMAL LOW (ref 8.9–10.3)
Chloride: 106 mmol/L (ref 98–111)
Creatinine, Ser: 1.21 mg/dL — ABNORMAL HIGH (ref 0.44–1.00)
GFR calc Af Amer: >60 mL/min (ref >60)
GFR calc non Af Amer: 56 mL/min — ABNORMAL LOW (ref >60)
Glucose, Bld: 107 mg/dL — ABNORMAL HIGH (ref 70–99)
Potassium: 3.8 mmol/L (ref 3.5–5.1)
Sodium: 134 mmol/L — ABNORMAL LOW (ref 135–145)
Total Bilirubin: 0.1 mg/dL — ABNORMAL LOW (ref 0.3–1.2)
Total Protein: 4.6 g/dL — ABNORMAL LOW (ref 6.5–8.1)

## 2018-11-05 LAB — LIPASE, BLOOD: Lipase: 20 U/L (ref 11–51)

## 2018-11-05 LAB — TROPONIN I (HIGH SENSITIVITY): Troponin I (High Sensitivity): <2 ng/L (ref <18)

## 2018-11-05 MED ORDER — SODIUM CHLORIDE 0.9 % IV BOLUS
1000.0000 mL | Freq: Once | INTRAVENOUS | Status: AC
  Administered 2018-11-05: 1000 mL via INTRAVENOUS

## 2018-11-05 MED ORDER — KETOROLAC TROMETHAMINE 30 MG/ML IJ SOLN
30.0000 mg | Freq: Once | INTRAMUSCULAR | Status: AC
  Administered 2018-11-05: 23:00:00 30 mg via INTRAVENOUS
  Filled 2018-11-05: qty 1

## 2018-11-05 NOTE — ED Notes (Signed)
ED Provider at bedside. 

## 2018-11-05 NOTE — Discharge Instructions (Signed)
As we discussed, we believe her symptoms are caused today by mild volume depletion, or mild dehydration, without any evidence of damage to your body.  Please drink plenty of clear fluids such as water and/or Gatorade and follow up with your regular doctor or the doctors listed in his documentation at the next available opportunity.  Return to the emergency department with any new or worsening symptoms that concern you, including but not limited to fever, shortness of breath, chest pain, or other concerning symptoms. ° ° °Dehydration, Adult °Dehydration is when you lose more fluids from the body than you take in. Vital organs like the kidneys, brain, and heart cannot function without a proper amount of fluids and salt. Any loss of fluids from the body can cause dehydration.  °CAUSES  °Vomiting. °Diarrhea. °Excessive sweating. °Excessive urine output. °Fever. °SYMPTOMS  °Mild dehydration °Thirst. °Dry lips. °Slightly dry mouth. °Moderate dehydration °Very dry mouth. °Sunken eyes. °Skin does not bounce back quickly when lightly pinched and released. °Dark urine and decreased urine production. °Decreased tear production. °Headache. °Severe dehydration °Very dry mouth. °Extreme thirst. °Rapid, weak pulse (more than 100 beats per minute at rest). °Cold hands and feet. °Not able to sweat in spite of heat and temperature. °Rapid breathing. °Blue lips. °Confusion and lethargy. °Difficulty being awakened. °Minimal urine production. °No tears. °DIAGNOSIS  °Your caregiver will diagnose dehydration based on your symptoms and your exam. Blood and urine tests will help confirm the diagnosis. The diagnostic evaluation should also identify the cause of dehydration. °TREATMENT  °Treatment of mild or moderate dehydration can often be done at home by increasing the amount of fluids that you drink. It is best to drink small amounts of fluid more often. Drinking too much at one time can make vomiting worse. Refer to the home care  instructions below. °Severe dehydration needs to be treated at the hospital where you will probably be given intravenous (IV) fluids that contain water and electrolytes. °HOME CARE INSTRUCTIONS  °Ask your caregiver about specific rehydration instructions. °Drink enough fluids to keep your urine clear or pale yellow. °Drink small amounts frequently if you have nausea and vomiting. °Eat as you normally do. °Avoid: °Foods or drinks high in sugar. °Carbonated drinks. °Juice. °Extremely hot or cold fluids. °Drinks with caffeine. °Fatty, greasy foods. °Alcohol. °Tobacco. °Overeating. °Gelatin desserts. °Wash your hands well to avoid spreading bacteria and viruses. °Only take over-the-counter or prescription medicines for pain, discomfort, or fever as directed by your caregiver. °Ask your caregiver if you should continue all prescribed and over-the-counter medicines. °Keep all follow-up appointments with your caregiver. °SEEK MEDICAL CARE IF: °You have abdominal pain and it increases or stays in one area (localizes). °You have a rash, stiff neck, or severe headache. °You are irritable, sleepy, or difficult to awaken. °You are weak, dizzy, or extremely thirsty. °SEEK IMMEDIATE MEDICAL CARE IF:  °You are unable to keep fluids down or you get worse despite treatment. °You have frequent episodes of vomiting or diarrhea. °You have blood or green matter (bile) in your vomit. °You have blood in your stool or your stool looks black and tarry. °You have not urinated in 6 to 8 hours, or you have only urinated a small amount of very dark urine. °You have a fever. °You faint. °MAKE SURE YOU:  °Understand these instructions. °Will watch your condition. °Will get help right away if you are not doing well or get worse. °Document Released: 02/09/2005 Document Revised: 05/04/2011 Document Reviewed: 09/29/2010 °ExitCare® Patient Information ©2015   [employer]. This information is not intended to replace advice given to you by your health  care provider. Make sure you discuss any questions you have with your health care provider. ° °Rehydration, Adult °Rehydration is the replacement of body fluids lost during dehydration. Dehydration is an extreme loss of body fluids to the point of body function impairment. There are many ways extreme fluid loss can occur, including vomiting, diarrhea, or excess sweating. Recovering from dehydration requires replacing lost fluids, continuing to eat to maintain strength, and avoiding foods and beverages that may contribute to further fluid loss or may increase nausea. °HOW TO REHYDRATE °In most cases, rehydration involves the replacement of not only fluids but also carbohydrates and basic body salts. Rehydration with an oral rehydration solution is one way to replace essential nutrients lost through dehydration. °An oral rehydration solution can be purchased at pharmacies, retail stores, and online. Premixed packets of powder that you combine with water to make a solution are also sold. You can prepare an oral rehydration solution at home by mixing the following ingredients together:  ° - tsp table salt. °¾ tsp baking soda. ° tsp salt substitute containing potassium chloride. °1 tablespoons sugar. °1 L (34 oz) of water. °Be sure to use exact measurements. Including too much sugar can make diarrhea worse. °Drink ½-1 cup (120-240 mL) of oral rehydration solution each time you have diarrhea or vomit. If drinking this amount makes your vomiting worse, try drinking smaller amounts more often. For example, drink 1-3 tsp every 5-10 minutes.  °A general rule for staying hydrated is to drink 1½-2 L of fluid per day. Talk to your caregiver about the specific amount you should be drinking each day. Drink enough fluids to keep your urine clear or pale yellow. °EATING WHEN DEHYDRATED °Even if you have had severe sweating or you are having diarrhea, do not stop eating. Many healthy items in a normal diet are okay to continue eating  while recovering from dehydration. The following tips can help you to lessen nausea when you eat: °Ask someone else to prepare your food. Cooking smells may worsen nausea. °Eat in a well-ventilated room away from cooking smells. °Sit up when you eat. Avoid lying down until 1-2 hours after eating. °Eat small amounts when you eat. °Eat foods that are easy to digest. These include soft, well-cooked, or mashed foods. °FOODS AND BEVERAGES TO AVOID °Avoid eating or drinking the following foods and beverages that may increase nausea or further loss of fluid:  °Fruit juices with a high sugar content, such as concentrated juices. °Alcohol. °Beverages containing caffeine. °Carbonated drinks. They may cause a lot of gas. °Foods that may cause a lot of gas, such as cabbage, broccoli, and beans. °Fatty, greasy, and fried foods. °Spicy, very salty, and very sweet foods or drinks. °Foods or drinks that are very hot or very cold. Consume food or drinks at or near room temperature. °Foods that need a lot of chewing, such as raw vegetables. °Foods that are sticky or hard to swallow, such as peanut butter. °Document Released: 05/04/2011 Document Revised: 11/04/2011 Document Reviewed: 05/04/2011 °ExitCare® Patient Information ©2015 [employer]. This information is not intended to replace advice given to you by your health care provider. Make sure you discuss any questions you have with your health care provider. ° ° ° °

## 2018-11-05 NOTE — ED Triage Notes (Signed)
Pt is A/Ox4, but appears to be impaired in triage having difficulty concentrating. Pt denies any ETOH or substance use.

## 2018-11-05 NOTE — ED Notes (Signed)
Patient transported to X-ray 

## 2018-11-05 NOTE — ED Provider Notes (Signed)
Emergency Department Provider Note   I have reviewed the triage vital signs and the nursing notes.   HISTORY  Chief Complaint Weakness   HPI Heather Barton is a 38 y.o. female with PMH of PTSD, Bipolar disorder, Methadone pain mgmt, and seizure disorder presents to the emergency department for evaluation of generalized weakness, headache, chest discomfort, and breakthrough seizure today.  Patient donates plasma twice per week and went today as normal.  States that she ate and was feeling well prior to donating.  She began having pain in her arm during the donation and then developed a headache.  She felt lightheaded and had to stop with the transfusion.  She states that they found her to have low blood pressures and ultimately called EMS.  States that she initially refused transport to the emergency department and went home with her boyfriend.  She took her methadone at which point she developed a central to slightly right-sided chest discomfort.  Boyfriend again called EMS who transported her to [facility].  She states that in route she had a brief seizure, which is not unusual for her.  She states she typically has 1-2 seizures per week and at this point was brief.  Did not require any abortive medication.  She was triaged to the waiting room but states the wait was very long so she left without being seen.  Her boyfriend drove her here for evaluation.   Past Medical History:  Diagnosis Date  . Bipolar 1 disorder ([place])   . Brain tumor (benign) ([place])   . Personality disorder ([place])   . PTSD (post-traumatic stress disorder)   . Seizures ([place])    last seizure 2 weeks ago  . Stroke ([place])   . Substance abuse ([place])     There are no active problems to display for this patient.   Past Surgical History:  Procedure Laterality Date  . LEG SURGERY    . TUBAL LIGATION    . TUMOR REMOVAL      Allergies Sulfa antibiotics, Aripiprazole, Duloxetine hcl, and Vicodin  [hydrocodone-acetaminophen]  No family history on file.  Social History Social History   Tobacco Use  . Smoking status: Current Every Day Smoker    Packs/day: 0.50    Types: Cigarettes  . Smokeless tobacco: Never Used  Substance Use Topics  . Alcohol use: No  . Drug use: No    Review of Systems  Constitutional: No fever/chills. Positive generalized weakness.  Eyes: No visual changes. ENT: No sore throat. Cardiovascular: Positive chest pain. Respiratory: Denies shortness of breath. Gastrointestinal: No abdominal pain.  No nausea, no vomiting.  No diarrhea.  No constipation. Genitourinary: Negative for dysuria. Musculoskeletal: Negative for back pain. Skin: Negative for rash. Neurological: Negative for focal weakness or numbness. Positive HA.   10-point ROS otherwise negative.  ____________________________________________   PHYSICAL EXAM:  VITAL SIGNS: ED Triage Vitals  Enc Vitals Group     BP 11/05/18 2128 101/66     Pulse Rate 11/05/18 2128 63     Resp 11/05/18 2128 20     Temp 11/05/18 2128 98.5 F (36.9 C)     Temp Source 11/05/18 2128 Oral     SpO2 11/05/18 2128 100 %     Weight 11/05/18 2126 175 lb (79.4 kg)     Height 11/05/18 2126 5\' 6"  (1.676 m)   Constitutional: Alert and oriented. Well appearing and in no acute distress. Eyes: Conjunctivae are normal. PERRL. EOMI. Head: Atraumatic. Nose: No  congestion/rhinnorhea. Mouth/Throat: Mucous membranes are moist.  Neck: No stridor.   Cardiovascular: Normal rate, regular rhythm. Good peripheral circulation. Grossly normal heart sounds.   Respiratory: Normal respiratory effort.  No retractions. Lungs CTAB. Gastrointestinal: Soft and nontender. No distention.  Musculoskeletal: No lower extremity tenderness nor edema. No gross deformities of extremities.  Neurologic:  Normal speech and language. No gross focal neurologic deficits are appreciated.  Skin:  Skin is warm, dry and intact. No rash noted.   ____________________________________________   LABS (all labs ordered are listed, but only abnormal results are displayed)  Labs Reviewed  COMPREHENSIVE METABOLIC PANEL - Abnormal; Notable for the following components:      Result Value   Sodium 134 (*)    CO2 21 (*)    Glucose, Bld 107 (*)    Creatinine, Ser 1.21 (*)    Calcium 7.6 (*)    Total Protein 4.6 (*)    Albumin 2.6 (*)    AST 13 (*)    Total Bilirubin 0.1 (*)    GFR calc non Af Amer 56 (*)    All other components within normal limits  RAPID URINE DRUG SCREEN, HOSP PERFORMED - Abnormal; Notable for the following components:   Amphetamines POSITIVE (*)    All other components within normal limits  URINALYSIS, ROUTINE W REFLEX MICROSCOPIC - Abnormal; Notable for the following components:   APPearance CLOUDY (*)    Specific Gravity, Urine >1.030 (*)    All other components within normal limits  LIPASE, BLOOD  CBC WITH DIFFERENTIAL/PLATELET  ETHANOL  PREGNANCY, URINE  TROPONIN I (HIGH SENSITIVITY)   ____________________________________________  EKG   EKG Interpretation  Date/Time:  Saturday November 05 2018 21:30:37 EDT Ventricular Rate:  66 PR Interval:  128 QRS Duration: 72 QT Interval:  300 QTC Calculation: 314 R Axis:   52 Text Interpretation:  Normal sinus rhythm Nonspecific T wave abnormality Abnormal ECG No STEMI  Confirmed by Nanda Quinton [phone]) on 11/05/2018 9:37:48 PM       ____________________________________________  RADIOLOGY  Dg Chest 2 View  Result Date: 11/05/2018 CLINICAL DATA:  Seizure chest pain EXAM: CHEST - 2 VIEW COMPARISON:  10/22/2018 FINDINGS: The heart size and mediastinal contours are within normal limits. Both lungs are clear. The visualized skeletal structures are unremarkable. Air distended bowel in the upper abdomen with copious stool. IMPRESSION: No active cardiopulmonary disease. Electronically Signed   By: Donavan Foil M.D.   On: 11/05/2018 22:35   Ct Head Wo  Contrast  Result Date: 11/05/2018 CLINICAL DATA:  Seizure EXAM: CT HEAD WITHOUT CONTRAST TECHNIQUE: Contiguous axial images were obtained from the base of the skull through the vertex without intravenous contrast. COMPARISON:  CT brain 11/23/2016, 10/01/2018, 03/22/2018 FINDINGS: Brain: No evidence of acute infarction, hemorrhage, hydrocephalus, extra-axial collection or mass lesion/mass effect. Vascular: No hyperdense vessel or unexpected calcification. Skull: Normal. Negative for fracture or focal lesion. Sinuses/Orbits: No acute finding. Other: None IMPRESSION: Negative.  No CT evidence for acute intracranial abnormality. Electronically Signed   By: Donavan Foil M.D.   On: 11/05/2018 22:34    ____________________________________________   PROCEDURES  Procedure(s) performed:   Procedures  None ____________________________________________   INITIAL IMPRESSION / ASSESSMENT AND PLAN / ED COURSE  Pertinent labs & imaging results that were available during my care of the patient were reviewed by me and considered in my medical decision making (see chart for details).   Patient presents to the emergency department for evaluation of generalized weakness, headache, breakthrough seizure, chest  pain.  Patient is overall well-appearing with no focal neuro deficits.  Her EKG is reassuring with no evidence of acute ischemia.  Plan for CT imaging of the head along with chest x-ray, screening labs, IV fluids.  Symptoms seem to begin around the time of donating plasma may be related to some element of hypotension.  No significant hypertension here.  No concern for septic, cardiogenic, neurogenic shock clinically.   Labs and imaging reviewed. No acute findings. Labs unremarkable. Plan for PCP follow up and continued PO hydration.  ____________________________________________  FINAL CLINICAL IMPRESSION(S) / ED DIAGNOSES  Final diagnoses:  Generalized weakness  Acute nonintractable headache,  unspecified headache type  Precordial chest pain     MEDICATIONS GIVEN DURING THIS VISIT:  Medications  sodium chloride 0.9 % bolus 1,000 mL (0 mLs Intravenous Stopped 11/06/18 0005)  ketorolac (TORADOL) 30 MG/ML injection 30 mg (30 mg Intravenous Given 11/05/18 2314)     Note:  This document was prepared using Dragon voice recognition software and may include unintentional dictation errors.  Nanda Quinton, MD Emergency Medicine    Long, Wonda [facility], MD 11/06/18 1028

## 2018-11-05 NOTE — ED Triage Notes (Signed)
Pt donated plasma today and became hypotensive. Since then pt has been weak, HA, and having chest pain.

## 2018-11-05 NOTE — ED Notes (Signed)
Patient transported to CT 

## 2019-04-12 ENCOUNTER — Emergency Department (HOSPITAL_BASED_OUTPATIENT_CLINIC_OR_DEPARTMENT_OTHER)
Admission: EM | Admit: 2019-04-12 | Discharge: 2019-04-13 | Disposition: A | Discharge status: Home / Self Care | Payer: Medicaid Other | Attending: Emergency Medicine | Admitting: Emergency Medicine

## 2019-04-12 ENCOUNTER — Encounter (HOSPITAL_BASED_OUTPATIENT_CLINIC_OR_DEPARTMENT_OTHER): Payer: Self-pay | Admitting: *Deleted

## 2019-04-12 ENCOUNTER — Other Ambulatory Visit: Payer: Self-pay

## 2019-04-12 ENCOUNTER — Emergency Department (HOSPITAL_BASED_OUTPATIENT_CLINIC_OR_DEPARTMENT_OTHER): Payer: Medicaid Other

## 2019-04-12 DIAGNOSIS — M5126 Other intervertebral disc displacement, lumbar region: Secondary | ICD-10-CM | POA: Insufficient documentation

## 2019-04-12 DIAGNOSIS — Z20822 Contact with and (suspected) exposure to covid-19: Secondary | ICD-10-CM | POA: Diagnosis not present

## 2019-04-12 DIAGNOSIS — F17210 Nicotine dependence, cigarettes, uncomplicated: Secondary | ICD-10-CM | POA: Diagnosis not present

## 2019-04-12 DIAGNOSIS — Z8673 Personal history of transient ischemic attack (TIA), and cerebral infarction without residual deficits: Secondary | ICD-10-CM | POA: Diagnosis not present

## 2019-04-12 DIAGNOSIS — M5136 Other intervertebral disc degeneration, lumbar region: Secondary | ICD-10-CM

## 2019-04-12 DIAGNOSIS — M545 Low back pain: Secondary | ICD-10-CM | POA: Diagnosis present

## 2019-04-12 DIAGNOSIS — Z79899 Other long term (current) drug therapy: Secondary | ICD-10-CM | POA: Insufficient documentation

## 2019-04-12 LAB — CBC WITH DIFFERENTIAL/PLATELET
Abs Immature Granulocytes: 0 10*3/uL (ref 0.00–0.07)
Basophils Absolute: 0 10*3/uL (ref 0.0–0.1)
Basophils Relative: 0 %
Eosinophils Absolute: 0 10*3/uL (ref 0.0–0.5)
Eosinophils Relative: 0 %
HCT: 37.7 % (ref 36.0–46.0)
Hemoglobin: 12.4 g/dL (ref 12.0–15.0)
Immature Granulocytes: 0 %
Lymphocytes Relative: 27 %
Lymphs Abs: 1.3 10*3/uL (ref 0.7–4.0)
MCH: 28.4 pg (ref 26.0–34.0)
MCHC: 32.9 g/dL (ref 30.0–36.0)
MCV: 86.5 fL (ref 80.0–100.0)
Monocytes Absolute: 0.2 10*3/uL (ref 0.1–1.0)
Monocytes Relative: 5 %
Neutro Abs: 3.1 10*3/uL (ref 1.7–7.7)
Neutrophils Relative %: 68 %
Platelets: 245 10*3/uL (ref 150–400)
RBC: 4.36 MIL/uL (ref 3.87–5.11)
RDW: 14.3 % (ref 11.5–15.5)
WBC: 4.6 10*3/uL (ref 4.0–10.5)
nRBC: 0 % (ref 0.0–0.2)

## 2019-04-12 LAB — BASIC METABOLIC PANEL WITH GFR
Anion gap: 7 (ref 5–15)
BUN: 8 mg/dL (ref 6–20)
CO2: 21 mmol/L — ABNORMAL LOW (ref 22–32)
Calcium: 8.7 mg/dL — ABNORMAL LOW (ref 8.9–10.3)
Chloride: 109 mmol/L (ref 98–111)
Creatinine, Ser: 0.98 mg/dL (ref 0.44–1.00)
GFR calc Af Amer: >60 mL/min
GFR calc non Af Amer: >60 mL/min
Glucose, Bld: 102 mg/dL — ABNORMAL HIGH (ref 70–99)
Potassium: 3.9 mmol/L (ref 3.5–5.1)
Sodium: 137 mmol/L (ref 135–145)

## 2019-04-12 LAB — RESPIRATORY PANEL BY RT PCR (FLU A&B, COVID)
Influenza A by PCR: NEGATIVE
Influenza B by PCR: NEGATIVE
SARS Coronavirus 2 by RT PCR: NEGATIVE

## 2019-04-12 MED ORDER — HYDROMORPHONE HCL 1 MG/ML IJ SOLN
0.5000 mg | Freq: Once | INTRAMUSCULAR | Status: AC
  Administered 2019-04-12: 0.5 mg via INTRAVENOUS
  Filled 2019-04-12: qty 1

## 2019-04-12 MED ORDER — DIPHENHYDRAMINE HCL 50 MG/ML IJ SOLN
25.0000 mg | Freq: Once | INTRAMUSCULAR | Status: AC
  Administered 2019-04-12: 21:00:00 25 mg via INTRAVENOUS
  Filled 2019-04-12: qty 1

## 2019-04-12 MED ORDER — IBUPROFEN 400 MG PO TABS
600.0000 mg | ORAL_TABLET | Freq: Once | ORAL | Status: AC
  Administered 2019-04-12: 600 mg via ORAL
  Filled 2019-04-12: qty 1

## 2019-04-12 MED ORDER — PREDNISONE 50 MG PO TABS
60.0000 mg | ORAL_TABLET | Freq: Once | ORAL | Status: AC
  Administered 2019-04-12: 60 mg via ORAL
  Filled 2019-04-12: qty 1

## 2019-04-12 NOTE — ED Provider Notes (Signed)
[facility] EMERGENCY DEPARTMENT Provider Note   CSN: [id number] Arrival date & time: 04/12/19  1422     History Chief Complaint  Patient presents with  . Leg Pain    Heather Barton is a 39 y.o. female presented emergency department with a mechanical fall approximate 1 week ago.  She says she tripped and fell forward onto the ground.  Afterwards began having pain in her lower back and in her bilateral legs.  Describes a burning sensation involving the entire legs.  She is the pain has been severe and unrelenting.  Says she is a difficulty walking due to pain mostly, however she has been able to walk, but says it has been hard going up stairs.  She denies any numbness in her legs.  She denies any known history of back trauma prior to this.  Update - patient later reported to me that she IS having urinary retention or hesitancy starting this week, "feels like I can't get it all out."  She also suffers from chronic constipation, this is unchanged, but cannot recall when she had her last BM  HPI     Past Medical History:  Diagnosis Date  . Bipolar 1 disorder ([place])   . Brain tumor (benign) ([place])   . Personality disorder ([place])   . PTSD (post-traumatic stress disorder)   . Seizures ([place])    last seizure 2 weeks ago  . Stroke ([place])   . Substance abuse ([place])     There are no problems to display for this patient.   Past Surgical History:  Procedure Laterality Date  . LEG SURGERY    . TUBAL LIGATION    . TUMOR REMOVAL       OB History    Gravida  6   Para  2   Term      Preterm      AB  4   Living  2     SAB  4   TAB      Ectopic      Multiple      Live Births              No family history on file.  Social History   Tobacco Use  . Smoking status: Current Every Day Smoker    Packs/day: 1.00    Types: Cigarettes  . Smokeless tobacco: Never Used  Substance Use Topics  . Alcohol use: No  . Drug use: No    Home Medications Prior to  Admission medications   Medication Sig Start Date End Date Taking? Authorizing Provider  levETIRAcetam (KEPPRA) 1000 MG tablet Take 2,000 mg by mouth 2 (two) times daily.    Yes [provider]  methadone (DOLOPHINE) 10 MG tablet Take by mouth.   Yes [provider]  omeprazole (PRILOSEC) 40 MG capsule Take by mouth daily. 06/24/18  Yes [provider]  ARIPiprazole (ABILIFY) 10 MG tablet Take 10 mg by mouth daily.    [provider]  busPIRone (BUSPAR) 5 MG tablet TAKE 1 TABLET BY MOUTH THREE TIMES A DAY 06/06/18   [provider]  diphenhydrAMINE (BENADRYL) 25 MG tablet Take 1 tablet (25 mg total) by mouth every 6 (six) hours. 04/14/15   Fredia Sorrow, MD  gabapentin (NEURONTIN) 300 MG capsule Take 600 mg by mouth 3 (three) times daily.     [provider]  ibuprofen (ADVIL,MOTRIN) 800 MG tablet Take 1 tablet (800 mg total) by mouth 3 (three) times  daily. 12/30/14   Marella Chimes, PA-C  lidocaine (LIDODERM) 5 % Place 1 patch onto the skin daily. Remove & Discard patch within 12 hours or as directed by MD 09/28/18   Randal Buba, April, MD  methylPREDNISolone (MEDROL DOSEPAK) 4 MG TBPK tablet Use as directed on the package 04/13/19   Fatima Blank, MD  naproxen (NAPROSYN) 375 MG tablet Take 1 tablet (375 mg total) by mouth 2 (two) times daily with a meal. 09/28/18   Palumbo, April, MD  naproxen (NAPROSYN) 500 MG tablet Take 1 tablet (500 mg total) by mouth 2 (two) times daily. 06/18/16   Fredia Sorrow, MD  permethrin (ELIMITE) 5 % cream Apply to entire body once and leave on for 8-12 hours before washing off 07/16/18   Alroy Bailiff, Margaux, PA-C  sertraline (ZOLOFT) 100 MG tablet Take 100 mg by mouth daily.    [provider]  traZODone (DESYREL) 100 MG tablet Take 100 mg by mouth at bedtime. Takes 4 tablets     400mg     [provider]    Allergies    Sulfa antibiotics, Aripiprazole, Duloxetine hcl, and Vicodin  [hydrocodone-acetaminophen]  Review of Systems   Review of Systems  Constitutional: Negative for chills and fever.  Respiratory: Negative for cough and shortness of breath.   Cardiovascular: Negative for chest pain and palpitations.  Gastrointestinal: Negative for abdominal pain and vomiting.  Genitourinary: Positive for difficulty urinating. Negative for dysuria.  Musculoskeletal: Positive for arthralgias, back pain, gait problem and myalgias.  Skin: Negative for pallor and rash.  Neurological: Positive for weakness and numbness. Negative for syncope, light-headedness and headaches.  All other systems reviewed and are negative.   Physical Exam Updated Vital Signs BP (!) 129/95 (BP Location: Right Arm)   Pulse 73   Temp 98.4 F (36.9 C) (Oral)   Resp 18   Ht 5' 6.5" (1.689 m)   Wt 78 kg   LMP 03/29/2019 Comment: signed preg test waiver  SpO2 100%   BMI 27.35 kg/m   Physical Exam Vitals and nursing note reviewed.  Constitutional:      Appearance: She is well-developed.     Comments: Tearful  HENT:     Head: Normocephalic and atraumatic.  Eyes:     Conjunctiva/sclera: Conjunctivae normal.  Cardiovascular:     Rate and Rhythm: Normal rate and regular rhythm.     Pulses: Normal pulses.  Pulmonary:     Effort: Pulmonary effort is normal. No respiratory distress.  Abdominal:     Palpations: Abdomen is soft.     Tenderness: There is no abdominal tenderness.  Musculoskeletal:     Cervical back: Neck supple.  Skin:    General: Skin is warm and dry.  Neurological:     Mental Status: She is alert.     GCS: GCS eye subscore is 4. GCS verbal subscore is 5. GCS motor subscore is 6.     Comments: No spinal midline tenderness Right lower back ttp near lumbar spine Reports diffuse paresthesias in all dermatomes of both legs, with no saddle anesthesia or paresthesia, and no extension of paresthesia into abdominal dermatomes No hyperreflexia 4/5 strength in bilateral lower  extremities with foot dorsi/plantarflexion, hip extension Able to ambulate with walker     ED Results / Procedures / Treatments   Labs (all labs ordered are listed, but only abnormal results are displayed) Labs Reviewed  BASIC METABOLIC PANEL - Abnormal; Notable for the following components:      Result Value  CO2 21 (*)    Glucose, Bld 102 (*)    Calcium 8.7 (*)    All other components within normal limits  RESPIRATORY PANEL BY RT PCR (FLU A&B, COVID)  CBC WITH DIFFERENTIAL/PLATELET    EKG None  Radiology CT Lumbar Spine Wo Contrast  Result Date: 04/12/2019 CLINICAL DATA:  39 year old female with left side back pain, bilateral leg pain and burning greater on the right. EXAM: CT LUMBAR SPINE WITHOUT CONTRAST TECHNIQUE: Multidetector CT imaging of the lumbar spine was performed without intravenous contrast administration. Multiplanar CT image reconstructions were also generated. COMPARISON:  CT lumbar spine 10/14/2017. FINDINGS: Segmentation: Normal, as designated previously. Alignment: Stable lumbar lordosis. No spondylolisthesis. Vertebrae: Bone mineralization is within normal limits. Stable benign appearing sclerosis in the anterior left L5 vertebra, right S1 ala. No acute osseous abnormality identified. Intact visible sacrum and SI joints. Paraspinal and other soft tissues: Visible noncontrast abdominal viscera today appears stable, negative. There is abundant retained stool in the visible large bowel. Negative lumbar paraspinal soft tissues. Disc levels: Capacious spinal canal in the lower thoracic and upper lumbar spine. T11-T12: Negative. T12-L1:  Negative. L1-L2:  Negative. L2-L3:  Negative. L3-L4: Minor disc bulge. Mild facet hypertrophy. This level appears stable without significant stenosis. L4-L5: Mild circumferential disc bulge. Moderate facet and ligament flavum hypertrophy. This level appears stable with mild if any spinal stenosis. L5-S1: Negative disc but moderate to severe  facet and ligament flavum hypertrophy and new vacuum facet phenomena on the left. Still, no spinal or lateral recess stenosis. Mild if any L5 foraminal stenosis. IMPRESSION: 1. No acute osseous abnormality in the lumbar spine. 2. The dominant lumbar degenerative finding is facet arthropathy, which is severe on the left at L5-S1. But only mild if any multifactorial spinal stenosis is suspected at L4-L5, foraminal stenosis at L5-S1. 3. Abundant retained stool in the visible large bowel. Electronically Signed   By: Genevie Ann M.D.   On: 04/12/2019 16:06   MR Lumbar Spine W Wo Contrast  Result Date: 04/13/2019 CLINICAL DATA:  Chronic low back pain. EXAM: MRI LUMBAR SPINE WITHOUT AND WITH CONTRAST TECHNIQUE: Multiplanar and multiecho pulse sequences of the lumbar spine were obtained without and with intravenous contrast. CONTRAST:  7.25mL GADAVIST GADOBUTROL 1 MMOL/ML IV SOLN COMPARISON:  Lumbar CT yesterday. FINDINGS: Segmentation:  5 lumbar type vertebral bodies. Alignment:  Normal Vertebrae:  Normal Conus medullaris and cauda equina: Conus extends to the L1 level. Conus and cauda equina appear normal. Paraspinal and other soft tissues: Negative Disc levels: L1-2: Minimal disc bulge.  No compressive stenosis. L2-3 and L3-4: Normal L4-5: Mild disc bulge. Facet osteoarthritis with mild edema. No compressive stenosis. The facet arthropathy could be a cause of low back pain or referred facet syndrome pain. L5-S1: Normal appearance of the disc. Bilateral facet osteoarthritis with mild edema. No compressive stenosis. Facet arthropathy could be a cause of low back or referred facet syndrome pain. IMPRESSION: No stenosis or neural compression. Mild bulging of the L4-5 disc. 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 This finding can certainly be associated with low back pain or referred facet syndrome pain. Electronically Signed   By: Nelson Chimes M.D.   On: 04/13/2019 00:57    Procedures Procedures  (including critical care time)  Medications Ordered in ED Medications  predniSONE (DELTASONE) tablet 60 mg (60 mg Oral Given 04/12/19 1619)  ibuprofen (ADVIL) tablet 600 mg (600 mg Oral Given 04/12/19 1619)  HYDROmorphone (DILAUDID) injection 0.5 mg (0.5  mg Intravenous Given 04/12/19 2127)  diphenhydrAMINE (BENADRYL) injection 25 mg (25 mg Intravenous Given 04/12/19 2123)  gadobutrol (GADAVIST) 1 MMOL/ML injection 7.5 mL (7.5 mLs Intravenous Contrast Given 04/13/19 0045)    ED Course  I have reviewed the triage vital signs and the nursing notes.  Pertinent labs & imaging results that were available during my care of the patient were reviewed by me and considered in my medical decision making (see chart for details).  39 yo female presenting to ED complaining of bilateral leg paresthesias ("burning pain") from the hips to her toes and weakness in her legs since a mechanical fall 1 week ago.  Complaining also of lower back pain.   On my initial exam the patient was denying urinary symptoms, but upon repeat questioning she tells me she's been having difficulty emptying her bladder ("I feel like I can't get it all out") only for the past week.    It's difficult to ascertain whether her leg 'weakness' is true neurological weakness or simply related to the pain that she reports she is in.  Spinal cord compression remains the rule-out diagnosis at this time.  She'll need transfer for an MRI of the L spine.  Clinical Course as of Apr 12 1237  Wed Apr 12, 2019  1838 Patient now reporting to me that she is having difficulty emptying her bladder.  Still has no saddle anesthesia.  Still complaining of significant pain in legs.  Will reach out to [facility] now regarding transfer for MRI for cord compression evaluation   [MT]  1934 Accepted for Ed to Ed transfer to [facility] by dr Sabra Heck   [MT]  1944 Notified kelly ledbetter of neurosurgery that we would be sending patient to [facility] for MRI   [MT]    Clinical  Course User Index [MT] Lamya Lausch, Carola Rhine, MD   Final Clinical Impression(s) / ED Diagnoses Final diagnoses:  Bulging lumbar disc    Rx / DC Orders ED Discharge Orders         Ordered    methylPREDNISolone (MEDROL DOSEPAK) 4 MG TBPK tablet     04/13/19 0215           Wyvonnia Dusky, MD 04/13/19 1239

## 2019-04-12 NOTE — ED Triage Notes (Signed)
Pt presents to the ED for chronic low back pain. Pt reports a recent CT with "lumbar degenerative finding is facet arthropathy, which is severe on the left at L5-S1". Pt reports that she is here for a MRI. EMS reports that 0.5mg  dilaudid and benadryl IV was given.

## 2019-04-12 NOTE — ED Triage Notes (Signed)
Pt c/o bil leg pain w/o injury x 1 week

## 2019-04-13 ENCOUNTER — Encounter (HOSPITAL_COMMUNITY): Payer: Self-pay | Admitting: *Deleted

## 2019-04-13 ENCOUNTER — Emergency Department (HOSPITAL_COMMUNITY): Payer: Medicaid Other

## 2019-04-13 MED ORDER — METHYLPREDNISOLONE 4 MG PO TBPK: ORAL_TABLET | ORAL | 0 refills | Status: DC

## 2019-04-13 MED ORDER — GADOBUTROL 1 MMOL/ML IV SOLN
7.5000 mL | Freq: Once | INTRAVENOUS | Status: AC | PRN
  Administered 2019-04-13: 7.5 mL via INTRAVENOUS

## 2019-04-13 NOTE — ED Provider Notes (Signed)
I assumed care of this patient from Dr. Langston Masker after transfer.  Please see their note for further details of Hx, PE.  Briefly patient is a 39 y.o. female who presented lower back pain sent here for MRI to rule out cauda equina.  MRI with L4-L5 mildly herniated disc.  No compression or stenosis.  No evidence of cauda equina.  Patient stable for discharge.  Neurosurgery follow-up as needed.  The patient appears reasonably screened and/or stabilized for discharge and I doubt any other medical condition or other [facility] requiring further screening, evaluation, or treatment in the ED at this time prior to discharge. Safe for discharge with strict return precautions.  Disposition: Discharge  Condition: Good  I have discussed the results, Dx and Tx plan with the patient/family who expressed understanding and agree(s) with the plan. Discharge instructions discussed at length. The patient/family was given strict return precautions who verbalized understanding of the instructions. No further questions at time of discharge.    ED Discharge Orders         Ordered    methylPREDNISolone (MEDROL DOSEPAK) 4 MG TBPK tablet     04/13/19 0215            Follow Up: Eleonore Chiquito, NP [address] [place] [address] [phone]  Schedule an appointment as soon as possible for a visit           Brenya Taulbee, Grayce Sessions, MD 04/13/19 [phone]

## 2019-04-13 NOTE — ED Notes (Signed)
Pt verbalizes understanding of d/c instructions. Prescriptions reviewed with patient. Pt ambulatory at d/c with all belongings.  

## 2019-04-23 ENCOUNTER — Emergency Department (HOSPITAL_BASED_OUTPATIENT_CLINIC_OR_DEPARTMENT_OTHER)
Admission: EM | Admit: 2019-04-23 | Discharge: 2019-04-23 | Disposition: A | Discharge status: Home / Self Care | Payer: Medicaid Other | Attending: Emergency Medicine | Admitting: Emergency Medicine

## 2019-04-23 ENCOUNTER — Other Ambulatory Visit: Payer: Self-pay

## 2019-04-23 ENCOUNTER — Encounter (HOSPITAL_BASED_OUTPATIENT_CLINIC_OR_DEPARTMENT_OTHER): Payer: Self-pay

## 2019-04-23 DIAGNOSIS — Z885 Allergy status to narcotic agent status: Secondary | ICD-10-CM | POA: Insufficient documentation

## 2019-04-23 DIAGNOSIS — W19XXXA Unspecified fall, initial encounter: Secondary | ICD-10-CM

## 2019-04-23 DIAGNOSIS — Z888 Allergy status to other drugs, medicaments and biological substances status: Secondary | ICD-10-CM | POA: Diagnosis not present

## 2019-04-23 DIAGNOSIS — M545 Low back pain, unspecified: Secondary | ICD-10-CM

## 2019-04-23 DIAGNOSIS — G40909 Epilepsy, unspecified, not intractable, without status epilepticus: Secondary | ICD-10-CM | POA: Insufficient documentation

## 2019-04-23 DIAGNOSIS — F17210 Nicotine dependence, cigarettes, uncomplicated: Secondary | ICD-10-CM | POA: Insufficient documentation

## 2019-04-23 DIAGNOSIS — Z8673 Personal history of transient ischemic attack (TIA), and cerebral infarction without residual deficits: Secondary | ICD-10-CM | POA: Insufficient documentation

## 2019-04-23 DIAGNOSIS — Z882 Allergy status to sulfonamides status: Secondary | ICD-10-CM | POA: Diagnosis not present

## 2019-04-23 MED ORDER — METHYLPREDNISOLONE 4 MG PO TBPK: ORAL_TABLET | ORAL | 0 refills | Status: DC

## 2019-04-23 NOTE — ED Triage Notes (Signed)
Pt stating she was seen on 2/17 for back pain and given a Rx for a steroid. Pt states she lost her Rx and wants a new one. Pt is c/o severe back pain and is tearful. Pt states she fell again on 2/26 which aggravated her symptoms.

## 2019-04-23 NOTE — Discharge Instructions (Signed)

## 2019-04-23 NOTE — ED Provider Notes (Signed)
Emergency Department Provider Note   I have reviewed the triage vital signs and the nursing notes.   HISTORY  Chief Complaint Back Pain   HPI Heather Barton is a 39 y.o. female with chronic back pain presents to the ED with continued pain. She was seen in the ED on 2/18 with MRI of the lumbar spine at this time. She reports losing her AVS paperwork after the visit and has not been able to follow up with spine surgery as advised. She has not taken the steroid medication. She has had an additional fall 3 days prior with return of pain back. Denies any new weakness/numbness. No groin numbness or bowel/bladder symptoms.    Past Medical History:  Diagnosis Date  . Bipolar 1 disorder ([place])   . Brain tumor (benign) ([place])   . Personality disorder (Louise)   . PTSD (post-traumatic stress disorder)   . Seizures ([place])    last seizure 2 weeks ago  . Stroke ([place])   . Substance abuse ([place])     There are no problems to display for this patient.   Past Surgical History:  Procedure Laterality Date  . LEG SURGERY    . TUBAL LIGATION    . TUMOR REMOVAL      Allergies Sulfa antibiotics, Aripiprazole, Duloxetine hcl, and Vicodin [hydrocodone-acetaminophen]  No family history on file.  Social History Social History   Tobacco Use  . Smoking status: Current Every Day Smoker    Packs/day: 1.00    Types: Cigarettes  . Smokeless tobacco: Never Used  Substance Use Topics  . Alcohol use: No  . Drug use: No    Review of Systems  Constitutional: No fever/chills Eyes: No visual changes. ENT: No sore throat. Cardiovascular: Denies chest pain. Respiratory: Denies shortness of breath. Gastrointestinal: No abdominal pain.  No nausea, no vomiting.  No diarrhea.  No constipation. Genitourinary: Negative for dysuria. Musculoskeletal: Positive for back pain. Skin: Negative for rash. Neurological: Negative for headaches, focal weakness or numbness.  10-point ROS otherwise  negative.  ____________________________________________   PHYSICAL EXAM:  VITAL SIGNS: ED Triage Vitals [04/23/19 2211]  Enc Vitals Group     BP (!) 153/104     Pulse Rate 100     Resp 20     Temp 98.3 F (36.8 C)     Temp Source Oral     SpO2 97 %     Weight 165 lb (74.8 kg)     Height 5' 6.5" (1.689 m)   Constitutional: Alert and oriented. Well appearing and in no acute distress. Eyes: Conjunctivae are normal. Head: Atraumatic. Nose: No congestion/rhinnorhea. Mouth/Throat: Mucous membranes are moist.  Neck: No stridor. No cervical spine tenderness.  Cardiovascular: Normal rate, regular rhythm. Good peripheral circulation.  Respiratory: Normal respiratory effort.  Gastrointestinal: Soft and nontender. No distention.  Musculoskeletal: No gross deformities of extremities. Normal ROM of the upper and lower extremities.  Neurologic:  Normal speech and language. No gross focal neurologic deficits are appreciated. Normal strength and sensation in the bilateral LEs and 2+ patellar reflexes.  Skin:  Skin is warm, dry and intact. No rash noted.  ____________________________________________  RADIOLOGY  None ____________________________________________   PROCEDURES  Procedure(s) performed:   Procedures  None ____________________________________________   INITIAL IMPRESSION / ASSESSMENT AND PLAN / ED COURSE  Pertinent labs & imaging results that were available during my care of the patient were reviewed by me and considered in my medical decision making (see chart for details).  Patient with chronic back pain returns for re-prescribing her medrol dose pack. She did not get the initial one filled and lost contact information for spine surgery given at last ED visit. This AVS was re-printed and given to the patient and will prescribe steroid dose pack. MRI from 2/18 reviewed. Given my exam today will not pursue additional spine imaging at this time. Discussed ED return  precautions.    ____________________________________________  FINAL CLINICAL IMPRESSION(S) / ED DIAGNOSES  Final diagnoses:  Midline low back pain without sciatica, unspecified chronicity  Fall, initial encounter     Note:  This document was prepared using Dragon voice recognition software and may include unintentional dictation errors.  Nanda Quinton, MD, [facility]    Shevonne Wolf, Wonda Olds, MD 04/24/19 1113

## 2019-05-07 ENCOUNTER — Other Ambulatory Visit: Payer: Self-pay

## 2019-05-07 ENCOUNTER — Emergency Department (HOSPITAL_BASED_OUTPATIENT_CLINIC_OR_DEPARTMENT_OTHER)
Admission: EM | Admit: 2019-05-07 | Discharge: 2019-05-07 | Disposition: A | Discharge status: Home / Self Care | Payer: Medicaid Other | Attending: Emergency Medicine | Admitting: Emergency Medicine

## 2019-05-07 ENCOUNTER — Encounter (HOSPITAL_BASED_OUTPATIENT_CLINIC_OR_DEPARTMENT_OTHER): Payer: Self-pay | Admitting: Emergency Medicine

## 2019-05-07 DIAGNOSIS — R000 Tachycardia, unspecified: Secondary | ICD-10-CM | POA: Insufficient documentation

## 2019-05-07 DIAGNOSIS — Z888 Allergy status to other drugs, medicaments and biological substances status: Secondary | ICD-10-CM | POA: Diagnosis not present

## 2019-05-07 DIAGNOSIS — F17210 Nicotine dependence, cigarettes, uncomplicated: Secondary | ICD-10-CM | POA: Diagnosis not present

## 2019-05-07 DIAGNOSIS — Z79899 Other long term (current) drug therapy: Secondary | ICD-10-CM | POA: Insufficient documentation

## 2019-05-07 DIAGNOSIS — Z8673 Personal history of transient ischemic attack (TIA), and cerebral infarction without residual deficits: Secondary | ICD-10-CM | POA: Diagnosis not present

## 2019-05-07 DIAGNOSIS — K0889 Other specified disorders of teeth and supporting structures: Secondary | ICD-10-CM | POA: Diagnosis present

## 2019-05-07 DIAGNOSIS — Z885 Allergy status to narcotic agent status: Secondary | ICD-10-CM | POA: Diagnosis not present

## 2019-05-07 DIAGNOSIS — G40909 Epilepsy, unspecified, not intractable, without status epilepticus: Secondary | ICD-10-CM | POA: Insufficient documentation

## 2019-05-07 DIAGNOSIS — Z882 Allergy status to sulfonamides status: Secondary | ICD-10-CM | POA: Insufficient documentation

## 2019-05-07 MED ORDER — CHLORHEXIDINE GLUCONATE 0.12 % MT SOLN: 15.0000 mL | Freq: Two times a day (BID) | OROMUCOSAL | 0 refills | Status: DC

## 2019-05-07 MED ORDER — PENICILLIN V POTASSIUM 500 MG PO TABS: 500.0000 mg | ORAL_TABLET | Freq: Four times a day (QID) | ORAL | 0 refills | Status: AC

## 2019-05-07 MED ORDER — PENICILLIN V POTASSIUM 250 MG PO TABS
500.0000 mg | ORAL_TABLET | Freq: Once | ORAL | Status: AC
  Administered 2019-05-07: 250 mg via ORAL
  Filled 2019-05-07: qty 2

## 2019-05-07 NOTE — ED Triage Notes (Signed)
Pt reports having "teeth that are deteriorating and causing an infection through my body". Denies facial swelling, endorses gum swelling. Afebrile on arrival, denies taking antipyretics. States she has been taking orajel and ibuprofen for pain at home. Pt states she does have a dentist but isn't able to get in to see them.

## 2019-05-07 NOTE — Discharge Instructions (Signed)
Today you were seen for dental pain.  If you start to experience and new or worsening symptoms return to the emergency department. If you start to experience fever, chills, neck stiffness/pain, or inability to move your neck or open your mouth come back to the emergency department immediately. If you begin to experience any blistering, rashes, swelling, or difficulty breathing seek medical care for evaluation of potentially more serious side effects.  Medications:  -I have prescribed you an antibiotic  penicillin to treat the infection  -Recommend continuing ibuprofen which is an anti-inflammatory medicine to treat the pain.  -Swish and spit 15 mL of chlorhexidine mouthwash for 30 seconds times daily.  You can also gargle warm salt water up to 5 times daily.  Both of these rinses can help to remove bacteria from the mouth.  You can also use viscous lidocaine every 3 hours to help numb the area.  You can apply a cool compress for 15 to 20 minutes, but avoid applying heat to the area. -Continue usual home medications.  2. Follow Up: Use the resource guide listed below to help you find a dentist if you do not already have one to follow up with. It is very important that you get evaluated by a dentist as soon as possible. Call tomorrow to schedule an appointment. Read the instructions below.     Be sure to eat something when taking the Naproxen as it can cause stomach upset and at worst stomach bleeding. Do not take additional non steroidal anti-inflammatory medicines such as Ibuprofen, Aleve, Advil, Mobic, Diclofenac, or goodie powder while taking Naproxen. You may supplement with Tylenol.    Dental Care: Organization         Address  Phone  Notes  [facility] Department of [facility] [phone] Accepts children up to age 85 who are enrolled in [place] or [place]; pregnant women with a Medicaid card; and children who have  applied for Medicaid or [place] Health Choice, but were declined, whose parents can pay a reduced fee at time of service.  [facility] Department of [facility]  [address], [place] [phone] Accepts children up to age 36 who are enrolled in [place] or [place]; pregnant women with a Medicaid card; and children who have applied for Medicaid or [place] Health Choice, but were declined, whose parents can pay a reduced fee at time of service.  [facility]  [place] [phone] Patients are seen by appointment only. Walk-ins are not accepted. [place] will see patients 28 years of age and older. Monday - Tuesday (8am-5pm) Most Wednesdays (8:30-5pm) $30 per visit, cash only  [facility]  [address], [facility] [phone] Patients are seen by appointment only. Walk-ins are not accepted. [place] will see patients 57 years of age and older. One Wednesday Evening (Monthly: Volunteer Based).  $30 per visit, cash only  [place]  [phone] for adults; Children under age 67, call Graduate Pediatric Dentistry at [phone]. Children aged 25-14, please call [phone] to request a pediatric application.  Dental services are provided in all areas of dental care including fillings, crowns and bridges, complete and partial dentures, implants, gum treatment, root canals, and extractions. Preventive care is also provided. Treatment is provided to both adults and children. Patients are selected via  a lottery and there is often a waiting list.   [facility] [address] [place]  [phone] [email or web address]   [facility] [address] [place], [place] [phone], Ext. [phone] Second and Fourth Thursday of each month, opens at 6:30 AM; Clinic ends at 9 AM.  Patients are seen on a first-come first-served basis, and a  limited number are seen during each clinic.   [facility]  [address] [place], [place] [phone]   Eligibility Requirements You must have lived in [place], [place], or [place] counties for at least the last three months.   You cannot be eligible for state or federal sponsored [employer], including [employer], [place], or [employer].   You generally cannot be eligible for healthcare insurance through your employer.    How to apply: Eligibility screenings are held every Tuesday and Wednesday afternoon from 1:00 pm until 4:00 pm. You do not need an appointment for the interview!  [facility] [address] [place], [place]   [place]  [place] Department  [place] Department  [phone]    RESOURCE GUIDE:  Dental Problems  Patients with Medicaid: [place] [phone] [address] Phone:  [phone]                                                  Phone:  [phone]  If unable to pay or uninsured, contact:  [facility] or [facility]. to become qualified for the adult dental clinic.  Insufficient Money for Medicine Contact United Way:  call "211" or [place] [phone].  No Primary Care Doctor Call [place] Other agencies that provide inexpensive medical care    [place]  [phone]    [facility]  [place]  [phone]    [facility]  [phone]    [facility]  [phone]    [facility]  [phone]

## 2019-05-07 NOTE — ED Provider Notes (Signed)
[facility]ENC[facility]MENT Provider Note   CSN: [id number] Arrival date & time: 05/07/19  2139     History Chief Complaint  Patient presents with  . Dental Pain    Heather Barton is a 39 y.o. female with past medical history significant for bipolar 1 disorder, benign brain tumor, PTSD, seizures, CVA, substance abuse presents to emergency department today with chief complaint of intermittent dental pain x 1 week.  She states she is under the care of a dentist and plan is to pull her teeth and to get veneers.  She is concerned that she is developing infection in her mouth because of her poor dentition and she thought she tasted purulent drainage from a molar.  She called her dentist and asked to be prescribed an antibiotic but it was suggested that she be seen in office.  She made an appointment but was unable to get to the office because her car broke down.  She rescheduled the appointment and it will be in 2 weeks.  Denies fever, chills, voice change, inability to control secretions, nausea/vomiting, facial swelling, dysphagia, odynophagia, drainage or trauma   Past Medical History:  Diagnosis Date  . Bipolar 1 disorder ([place])   . Brain tumor (benign) ([place])   . Personality disorder ([place])   . PTSD (post-traumatic stress disorder)   . Seizures ([place])    last seizure 2 weeks ago  . Stroke ([place])   . Substance abuse ([place])     There are no problems to display for this patient.   Past Surgical History:  Procedure Laterality Date  . LEG SURGERY    . TUBAL LIGATION    . TUMOR REMOVAL       OB History    Gravida  6   Para  2   Term      Preterm      AB  4   Living  2     SAB  4   TAB      Ectopic      Multiple      Live Births              No family history on file.  Social History   Tobacco Use  . Smoking status: Current Every Day Smoker    Packs/day: 1.00    Types: Cigarettes  . Smokeless tobacco: Never Used  Substance Use Topics    . Alcohol use: No  . Drug use: No    Home Medications Prior to Admission medications   Medication Sig Start Date End Date Taking? Authorizing Provider  ARIPiprazole (ABILIFY) 10 MG tablet Take 10 mg by mouth daily.    [provider]  busPIRone (BUSPAR) 5 MG tablet TAKE 1 TABLET BY MOUTH THREE TIMES A DAY 06/06/18   [provider]  chlorhexidine (PERIDEX) 0.12 % solution Use as directed 15 mLs in the mouth or throat 2 (two) times daily. 05/07/19   Amory Zbikowski E, PA-C  diphenhydrAMINE (BENADRYL) 25 MG tablet Take 1 tablet (25 mg total) by mouth every 6 (six) hours. 04/14/15   Fredia Sorrow, MD  gabapentin (NEURONTIN) 300 MG capsule Take 600 mg by mouth 3 (three) times daily.     [provider]  ibuprofen (ADVIL,MOTRIN) 800 MG tablet Take 1 tablet (800 mg total) by mouth 3 (three) times daily. 12/30/14   Marella Chimes, PA-C  levETIRAcetam (KEPPRA) 1000 MG tablet Take 2,000 mg by mouth 2 (two) times daily.  [provider]  lidocaine (LIDODERM) 5 % Place 1 patch onto the skin daily. Remove & Discard patch within 12 hours or as directed by MD 09/28/18   Randal Buba, April, MD  methadone (DOLOPHINE) 10 MG tablet Take by mouth.    [provider]  methylPREDNISolone (MEDROL DOSEPAK) 4 MG TBPK tablet Use as directed on the package 04/23/19   Long, Wonda Olds, MD  naproxen (NAPROSYN) 375 MG tablet Take 1 tablet (375 mg total) by mouth 2 (two) times daily with a meal. 09/28/18   Palumbo, April, MD  naproxen (NAPROSYN) 500 MG tablet Take 1 tablet (500 mg total) by mouth 2 (two) times daily. 06/18/16   Fredia Sorrow, MD  omeprazole (PRILOSEC) 40 MG capsule Take by mouth daily. 06/24/18   [provider]  penicillin v potassium (VEETID) 500 MG tablet Take 1 tablet (500 mg total) by mouth 4 (four) times daily for 7 days. 05/07/19 05/14/19  Yumi Insalaco, Harley Hallmark, PA-C  permethrin (ELIMITE) 5 % cream Apply to entire body once and leave on for 8-12  hours before washing off 07/16/18   Alroy Bailiff, Margaux, PA-C  sertraline (ZOLOFT) 100 MG tablet Take 100 mg by mouth daily.    [provider]  traZODone (DESYREL) 100 MG tablet Take 100 mg by mouth at bedtime. Takes 4 tablets     400mg     [provider]    Allergies    Sulfa antibiotics, Aripiprazole, Duloxetine hcl, and Vicodin [hydrocodone-acetaminophen]  Review of Systems   Review of Systems All other systems are reviewed and are negative for acute change except as noted in the HPI.  Physical Exam Updated Vital Signs BP (!) 168/109 (BP Location: Right Arm)   Pulse (!) 105   Temp 98.4 F (36.9 C) (Oral)   Resp 20   Ht 5\' 6"  (1.676 m)   Wt 74.8 kg   LMP 04/07/2019   SpO2 100%   BMI 26.63 kg/m   Physical Exam Vitals and nursing note reviewed.  Constitutional:      General: She is not in acute distress.    Appearance: She is not toxic-appearing.  HENT:     Head: Normocephalic and atraumatic.     Nose: Nose normal.     Mouth/Throat:     Comments: Chronic dental decay, multiple missing teeth.No noted area of swelling or fluctuance. No trismus. Mouth opening to at least 3 finger widths. Handles oral secretions without difficulty. External exam shows no asymmetry of the jaw line or face, no signs of obvious swelling or infection. No swelling or tenderness to the submental or submandibular regions. No swelling or tenderness into the soft tissues of the neck.Full active range of motion of the jaw. Neck is supple with full active range of motion, no tenderness to palpation of the soft tissues.  Eyes:     General: No scleral icterus.       Right eye: No discharge.        Left eye: No discharge.     Conjunctiva/sclera: Conjunctivae normal.  Cardiovascular:     Rate and Rhythm: Normal rate and regular rhythm.     Pulses: Normal pulses.     Heart sounds: Normal heart sounds.  Pulmonary:     Effort: Pulmonary effort is normal.     Breath sounds: Normal breath  sounds.  Abdominal:     General: There is no distension.  Musculoskeletal:        General: Normal range of motion.  Cervical back: Normal range of motion. No muscular tenderness.  Skin:    General: Skin is warm and dry.  Neurological:     Mental Status: She is oriented to person, place, and time.  Psychiatric:        Behavior: Behavior normal.     ED Results / Procedures / Treatments   Labs (all labs ordered are listed, but only abnormal results are displayed) Labs Reviewed - No data to display  EKG None  Radiology No results found.  Procedures Procedures (including critical care time)  Medications Ordered in ED Medications  penicillin v potassium (VEETID) tablet 500 mg (has no administration in time range)    ED Course  I have reviewed the triage vital signs and the nursing notes.  Pertinent labs & imaging results that were available during my care of the patient were reviewed by me and considered in my medical decision making (see chart for details).    MDM Rules/Calculators/A&P                      Patient seen and examined.  She is afebrile, nontoxic-appearing.  She was noted to be tachycardic to 105 in triage.  I personally checked patient's heart rate during exam and it ranged from 94-97, she appears anxious.  Pt is well appearing and is presenting with dental pain. Differential Diagnosis includes but is not limited to: toothache, abscess, periapical abscess, dental caries, cracked tooth, maxillary sinusitis, gingivitis, gum hyperplasia, tooth fracture, tooth dislocation. On exam patient has chronic dental decay., multiple missing teeth. Patient with toothache.  No gross abscess.  Exam unconcerning for Ludwig's angina or spread of infection.  Will treat with penicillin and recommend anti-inflammatories medicine.  Urged patient to follow-up with dentist and given dental resource list. VSS.  Pt appears stable for d/c. Strict return precautions  discussed.   Portions of this note were generated with [employer]. Dictation errors may occur despite best attempts at proofreading.   Final Clinical Impression(s) / ED Diagnoses Final diagnoses:  Pain, dental    Rx / DC Orders ED Discharge Orders         Ordered    penicillin v potassium (VEETID) 500 MG tablet  4 times daily     05/07/19 2234    chlorhexidine (PERIDEX) 0.12 % solution  2 times daily     05/07/19 2235           Flint Melter 05/07/19 2246    Margette Fast, MD 05/08/19 2023

## 2020-01-02 ENCOUNTER — Other Ambulatory Visit: Payer: Self-pay

## 2020-01-02 ENCOUNTER — Encounter (HOSPITAL_BASED_OUTPATIENT_CLINIC_OR_DEPARTMENT_OTHER): Payer: Self-pay | Admitting: *Deleted

## 2020-01-02 ENCOUNTER — Emergency Department (HOSPITAL_BASED_OUTPATIENT_CLINIC_OR_DEPARTMENT_OTHER): Payer: Medicaid Other

## 2020-01-02 ENCOUNTER — Emergency Department (HOSPITAL_BASED_OUTPATIENT_CLINIC_OR_DEPARTMENT_OTHER)
Admission: EM | Admit: 2020-01-02 | Discharge: 2020-01-02 | Disposition: A | Discharge status: Home / Self Care | Payer: Medicaid Other | Attending: Emergency Medicine | Admitting: Emergency Medicine

## 2020-01-02 DIAGNOSIS — D509 Iron deficiency anemia, unspecified: Secondary | ICD-10-CM | POA: Diagnosis not present

## 2020-01-02 DIAGNOSIS — R531 Weakness: Secondary | ICD-10-CM | POA: Insufficient documentation

## 2020-01-02 DIAGNOSIS — R202 Paresthesia of skin: Secondary | ICD-10-CM | POA: Diagnosis not present

## 2020-01-02 DIAGNOSIS — F17210 Nicotine dependence, cigarettes, uncomplicated: Secondary | ICD-10-CM | POA: Diagnosis not present

## 2020-01-02 DIAGNOSIS — H538 Other visual disturbances: Secondary | ICD-10-CM | POA: Diagnosis not present

## 2020-01-02 DIAGNOSIS — R519 Headache, unspecified: Secondary | ICD-10-CM

## 2020-01-02 DIAGNOSIS — R001 Bradycardia, unspecified: Secondary | ICD-10-CM

## 2020-01-02 DIAGNOSIS — R42 Dizziness and giddiness: Secondary | ICD-10-CM | POA: Insufficient documentation

## 2020-01-02 DIAGNOSIS — Z9851 Tubal ligation status: Secondary | ICD-10-CM | POA: Diagnosis not present

## 2020-01-02 DIAGNOSIS — Z85841 Personal history of malignant neoplasm of brain: Secondary | ICD-10-CM | POA: Insufficient documentation

## 2020-01-02 LAB — CBC WITH DIFFERENTIAL/PLATELET
Abs Immature Granulocytes: 0.02 10*3/uL (ref 0.00–0.07)
Basophils Absolute: 0 10*3/uL (ref 0.0–0.1)
Basophils Relative: 0 %
Eosinophils Absolute: 0 10*3/uL (ref 0.0–0.5)
Eosinophils Relative: 0 %
HCT: 31.1 % — ABNORMAL LOW (ref 36.0–46.0)
Hemoglobin: 9.6 g/dL — ABNORMAL LOW (ref 12.0–15.0)
Immature Granulocytes: 0 %
Lymphocytes Relative: 46 %
Lymphs Abs: 2.5 10*3/uL (ref 0.7–4.0)
MCH: 23.4 pg — ABNORMAL LOW (ref 26.0–34.0)
MCHC: 30.9 g/dL (ref 30.0–36.0)
MCV: 75.9 fL — ABNORMAL LOW (ref 80.0–100.0)
Monocytes Absolute: 0.3 10*3/uL (ref 0.1–1.0)
Monocytes Relative: 6 %
Neutro Abs: 2.6 10*3/uL (ref 1.7–7.7)
Neutrophils Relative %: 48 %
Platelets: 192 10*3/uL (ref 150–400)
RBC: 4.1 MIL/uL (ref 3.87–5.11)
RDW: 18 % — ABNORMAL HIGH (ref 11.5–15.5)
WBC: 5.4 10*3/uL (ref 4.0–10.5)
nRBC: 0 % (ref 0.0–0.2)

## 2020-01-02 LAB — COMPREHENSIVE METABOLIC PANEL
ALT: 16 U/L (ref 0–44)
AST: 16 U/L (ref 15–41)
Albumin: 3.4 g/dL — ABNORMAL LOW (ref 3.5–5.0)
Alkaline Phosphatase: 54 U/L (ref 38–126)
Anion gap: 7 (ref 5–15)
BUN: 15 mg/dL (ref 6–20)
CO2: 25 mmol/L (ref 22–32)
Calcium: 8.7 mg/dL — ABNORMAL LOW (ref 8.9–10.3)
Chloride: 105 mmol/L (ref 98–111)
Creatinine, Ser: 1.04 mg/dL — ABNORMAL HIGH (ref 0.44–1.00)
GFR, Estimated: >60 mL/min (ref >60)
Glucose, Bld: 99 mg/dL (ref 70–99)
Potassium: 4.5 mmol/L (ref 3.5–5.1)
Sodium: 137 mmol/L (ref 135–145)
Total Bilirubin: 0.5 mg/dL (ref 0.3–1.2)
Total Protein: 6 g/dL — ABNORMAL LOW (ref 6.5–8.1)

## 2020-01-02 LAB — URINALYSIS, ROUTINE W REFLEX MICROSCOPIC
Bilirubin Urine: NEGATIVE
Glucose, UA: NEGATIVE mg/dL
Hgb urine dipstick: NEGATIVE
Ketones, ur: NEGATIVE mg/dL
Leukocytes,Ua: NEGATIVE
Nitrite: NEGATIVE
Protein, ur: NEGATIVE mg/dL
Specific Gravity, Urine: >1.03 — ABNORMAL HIGH (ref 1.005–1.030)
pH: 5.5 (ref 5.0–8.0)

## 2020-01-02 LAB — PREGNANCY, URINE: Preg Test, Ur: NEGATIVE

## 2020-01-02 MED ORDER — METOCLOPRAMIDE HCL 5 MG/ML IJ SOLN
10.0000 mg | Freq: Once | INTRAMUSCULAR | Status: AC
  Administered 2020-01-02: 10 mg via INTRAVENOUS
  Filled 2020-01-02: qty 2

## 2020-01-02 MED ORDER — LACTATED RINGERS IV BOLUS
1000.0000 mL | Freq: Once | INTRAVENOUS | Status: AC
  Administered 2020-01-02: 1000 mL via INTRAVENOUS

## 2020-01-02 NOTE — ED Triage Notes (Signed)
HA x 2-3 days. Intermittent bilateral hand tingling x 2 weeks. Denies known injury. Reports hand tingling is worse after first waking up in the morning.

## 2020-01-02 NOTE — ED Provider Notes (Signed)
[facility] EMERGENCY DEPARTMENT Provider Note   CSN: [id number] Arrival date & time: 01/02/20  1938     History Chief Complaint  Patient presents with  . Headache    Heather Barton is a 39 y.o. female.  HPI   Heather Barton is a 39 y.o. lady with PMHx bipolar I disorder, ADHD, PTSD, seizure disorder, substance abuse on methadone, and note of benign brain tumor s/p removal per chart review, presenting with headache that has waxed and waned since onset about 2 weeks ago. She states she was cooking two weeks ago and a glass pot cover exploded. She had pain in her right ear associated with hearing loss and speech abnormalities (speaking nonsensical words) for about 4 hours, and balance troubles. She states her troubles above resolved after about 2 days, except her headache, which has continued to worsen. She describes it as a pressure behind both of her eyes, associated with photophobia, phonophobia, dry eyes R > L. She also endorses dry mouth that is new for her, light-headedness upon standing, and bilateral hand pain, tingling, numbness, and weakness, stating it is hard for her to grab objects. She endorses fevers and chills since last night. She otherwise states that she has felt well without nausea, vomiting, abdominal pain, neck or back pain, or other symptoms. She states her headache is unlike her typical migraines and has not responded to Topamax, tylenol, or ibuprofen. She states her headache and numbness in her hands are unlike her typical seizure symptoms, which usually present with facial and hand twitching. Her previous stroke presented as syncope and she denies any residual effects from this. She did take Percocet in attempt to relieve her headache PTA without noticeable affect and states she continues to take Methadone and Keppra in addition to her other medications without changes in doses or missed doses recently.   Past Medical History:  Diagnosis Date  . Bipolar 1  disorder ([place])   . Brain tumor (benign) ([place])   . Personality disorder ([place])   . PTSD (post-traumatic stress disorder)   . Seizures ([place])    last seizure 2 weeks ago  . Stroke ([place])   . Substance abuse [facility])     Past Surgical History:  Procedure Laterality Date  . LEG SURGERY    . TUBAL LIGATION    . TUMOR REMOVAL       OB History    Gravida  6   Para  2   Term      Preterm      AB  4   Living  2     SAB  4   TAB      Ectopic      Multiple      Live Births              History reviewed. No pertinent family history.  Social History   Tobacco Use  . Smoking status: Current Every Day Smoker    Packs/day: 1.00    Types: Cigarettes  . Smokeless tobacco: Never Used  Vaping Use  . Vaping Use: Never used  Substance Use Topics  . Alcohol use: No  . Drug use: No    Home Medications Prior to Admission medications   Medication Sig Start Date End Date Taking? Authorizing Provider  ARIPiprazole (ABILIFY) 10 MG tablet Take 10 mg by mouth daily.    [provider]  busPIRone (BUSPAR) 5 MG tablet TAKE 1 TABLET BY MOUTH THREE TIMES A DAY 06/06/18  [provider]  chlorhexidine (PERIDEX) 0.12 % solution Use as directed 15 mLs in the mouth or throat 2 (two) times daily. 05/07/19   Walisiewicz, Harley Hallmark, PA-C  diphenhydrAMINE (BENADRYL) 25 MG tablet Take 1 tablet (25 mg total) by mouth every 6 (six) hours. 04/14/15   Fredia Sorrow, MD  gabapentin (NEURONTIN) 300 MG capsule Take 600 mg by mouth 3 (three) times daily.     [provider]  ibuprofen (ADVIL,MOTRIN) 800 MG tablet Take 1 tablet (800 mg total) by mouth 3 (three) times daily. 12/30/14   Marella Chimes, PA-C  levETIRAcetam (KEPPRA) 1000 MG tablet Take 2,000 mg by mouth 2 (two) times daily.     [provider]  lidocaine (LIDODERM) 5 % Place 1 patch onto the skin daily. Remove & Discard patch within 12 hours or as directed by MD 09/28/18   Randal Buba, April, MD    methadone (DOLOPHINE) 10 MG tablet Take by mouth.    [provider]  methylPREDNISolone (MEDROL DOSEPAK) 4 MG TBPK tablet Use as directed on the package 04/23/19   Long, Wonda Olds, MD  naproxen (NAPROSYN) 375 MG tablet Take 1 tablet (375 mg total) by mouth 2 (two) times daily with a meal. 09/28/18   Palumbo, April, MD  naproxen (NAPROSYN) 500 MG tablet Take 1 tablet (500 mg total) by mouth 2 (two) times daily. 06/18/16   Fredia Sorrow, MD  omeprazole (PRILOSEC) 40 MG capsule Take by mouth daily. 06/24/18   [provider]  permethrin (ELIMITE) 5 % cream Apply to entire body once and leave on for 8-12 hours before washing off 07/16/18   Alroy Bailiff, Margaux, PA-C  sertraline (ZOLOFT) 100 MG tablet Take 100 mg by mouth daily.    [provider]  traZODone (DESYREL) 100 MG tablet Take 100 mg by mouth at bedtime. Takes 4 tablets     400mg     [provider]    Allergies    Sulfa antibiotics, Aripiprazole, Duloxetine hcl, and Vicodin [hydrocodone-acetaminophen]  Review of Systems   Review of Systems: 10 point review of systems otherwise negative except as noted above in HPI.   Physical Exam Updated Vital Signs BP 100/65   Pulse (!) 55   Temp 98.1 F (36.7 C) (Oral)   Resp 14   Ht 5' 6.5" (1.689 m)   Wt 74.8 kg   LMP 12/19/2019   SpO2 98%   BMI 26.23 kg/m   Physical Exam   General: Patient appears uncomfortable but otherwise well. No acute distress. Eyes: Sclera non-icteric. No conjunctival injection.  HENT: Neck is supple. No nasal discharge. Moist mucus membranes.  Respiratory: Lungs are CTA, bilaterally. No wheezes, rales, or rhonchi. No tachypnea or increased work of breathing. Cardiovascular: Rate is bradycardic. Rhythm is regular. No murmurs, rubs, or gallops. There is trace bilateral lower extremity pitting edema. Abdominal: There is significant bilateral lower extremity tenderness to palpation with voluntary guarding but no rebound tenderness. No  distention. Bowel sounds intact.  GU: No CVA tenderness.  Neurological: Patient is alert and oriented x 3. CN II-XII intact. Sensation to light touch is intact throughout. No asterixis.  Musculoskeletal: ROMI in all four extremities. Normal muscle bulk. There is slightly decreased muscle tone, although strength is 5/5 in all four extremities.  Skin: No lesions. No rashes. There are healed scars on anterior surface of forearms. Psych: Normal affect. Normal tone of voice.   ED Results / Procedures / Treatments   Labs (all labs ordered are listed, but  only abnormal results are displayed) Labs Reviewed  URINALYSIS, ROUTINE W REFLEX MICROSCOPIC - Abnormal; Notable for the following components:      Result Value   Specific Gravity, Urine >1.030 (*)    All other components within normal limits  CBC WITH DIFFERENTIAL/PLATELET - Abnormal; Notable for the following components:   Hemoglobin 9.6 (*)    HCT 31.1 (*)    MCV 75.9 (*)    MCH 23.4 (*)    RDW 18.0 (*)    All other components within normal limits  COMPREHENSIVE METABOLIC PANEL - Abnormal; Notable for the following components:   Creatinine, Ser 1.04 (*)    Calcium 8.7 (*)    Total Protein 6.0 (*)    Albumin 3.4 (*)    All other components within normal limits  PREGNANCY, URINE    EKG  EKG shows sinus bradycardia at a rate of 51bpm with normal PR and QTc intervals. No consecutive T wave inversions or ST segment changes to suggest acute ischemia.   Radiology CT Head Wo Contrast  Result Date: 01/02/2020 CLINICAL DATA:  Headache with hand tingling EXAM: CT HEAD WITHOUT CONTRAST CT CERVICAL SPINE WITHOUT CONTRAST TECHNIQUE: Multidetector CT imaging of the head and cervical spine was performed following the standard protocol without intravenous contrast. Multiplanar CT image reconstructions of the cervical spine were also generated. COMPARISON:  None. FINDINGS: CT HEAD FINDINGS Brain: There is no mass, hemorrhage or extra-axial  collection. The size and configuration of the ventricles and extra-axial CSF spaces are normal. 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 Brain parenchyma otherwise normal. Vascular: No abnormal hyperdensity of the major intracranial arteries or dural venous sinuses. No intracranial atherosclerosis. Skull: The visualized skull base, calvarium and extracranial soft tissues are normal. Sinuses/Orbits: No fluid levels or advanced mucosal thickening of the visualized paranasal sinuses. No mastoid or middle ear effusion. The orbits are normal. CT CERVICAL SPINE FINDINGS Alignment: No static subluxation. Facets are aligned. Occipital condyles are normally positioned. Skull base and vertebrae: No acute fracture. Soft tissues and spinal canal: No prevertebral fluid or swelling. No visible canal hematoma. Disc levels: No advanced spinal canal or neural foraminal stenosis. Upper chest: No pneumothorax, pulmonary nodule or pleural effusion. Other: Normal visualized paraspinal cervical soft tissues. IMPRESSION: 1. No acute intracranial abnormality. 2. 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 3. No acute fracture or static subluxation of the cervical spine. Electronically Signed   By: Ulyses Jarred M.D.   On: 01/02/2020 22:34   CT Cervical Spine Wo Contrast  Result Date: 01/02/2020 CLINICAL DATA:  Headache with hand tingling EXAM: CT HEAD WITHOUT CONTRAST CT CERVICAL SPINE WITHOUT CONTRAST TECHNIQUE: Multidetector CT imaging of the head and cervical spine was performed following the standard protocol without intravenous contrast. Multiplanar CT image reconstructions of the cervical spine were also generated. COMPARISON:  None. FINDINGS: CT HEAD FINDINGS Brain: There is no mass, hemorrhage or extra-axial collection. The size and configuration of the ventricles and extra-axial CSF spaces are normal. 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 Brain parenchyma otherwise normal. Vascular: No abnormal  hyperdensity of the major intracranial arteries or dural venous sinuses. No intracranial atherosclerosis. Skull: The visualized skull base, calvarium and extracranial soft tissues are normal. Sinuses/Orbits: No fluid levels or advanced mucosal thickening of the visualized paranasal sinuses. No mastoid or middle ear effusion. The orbits are normal. CT CERVICAL SPINE FINDINGS Alignment: No static subluxation. Facets are aligned. Occipital condyles are normally positioned. Skull base and  vertebrae: No acute fracture. Soft tissues and spinal canal: No prevertebral fluid or swelling. No visible canal hematoma. Disc levels: No advanced spinal canal or neural foraminal stenosis. Upper chest: No pneumothorax, pulmonary nodule or pleural effusion. Other: Normal visualized paraspinal cervical soft tissues. IMPRESSION: 1. No acute intracranial abnormality. 2. 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 3. No acute fracture or static subluxation of the cervical spine. Electronically Signed   By: Ulyses Jarred M.D.   On: 01/02/2020 22:34    Procedures Procedures (including critical care time)  Medications Ordered in ED Medications  lactated ringers bolus 1,000 mL (1,000 mLs Intravenous New Bag/Given 01/02/20 2221)  metoCLOPramide (REGLAN) injection 10 mg (10 mg Intravenous Given 01/02/20 2231)    ED Course  I have reviewed the triage vital signs and the nursing notes.  Pertinent labs & imaging results that were available during my care of the patient were reviewed by me and considered in my medical decision making (see chart for details).     MDM Rules/Calculators/A&P                          Patient presents with a waxing and waning bilateral frontal headache posterior to both orbits associated with photophobia, phonophobia, blurred vision, and dry eyes that has progressively worsened since onset about 2 weeks ago. She does endorse a traumatic episode at the time of onset without syncope, but  associated with bilateral hand pain and tingling. Neurological examination is unremarkable. However, patient does have a seizure disorder on Keppra 1000mg  twice daily without missing doses and history of remote CVA in 2007 per patient, with CT head without contrast in 2018 showing several small hypodense lesions along the globus pallidus nuclei bilaterally, concerning for small remote lacunar infarcts.   Patient's symptoms may be due to atypical migraine vs. Tension type headache vs. Atypical seizure. Also consider IIH given patient's age, neck trauma in the setting of concurrent bilateral pain and tingling of the hands vs. Somatic symptom disorder in the setting of multiple psychiatric diagnoses. Patient's lower abdominal tenderness could be explained by UTI vs. Pregnancy vs. Constipation vs. Ovarian pathology, although pregnancy less likely given tubal ligation, ovarian pathology less likely given tenderness is bilateral, constipation less likely although observed in previous abdominal CT scans due to normal bowel movements per patient.   Will check CT head and c spine without contrast to rule out underlying brain lesion or c spine trauma. Will also check CBC and CMP to rule out infection and electrolyte derangements as cause of symptoms.  Will check EKG to assess QTc interval and give Reglan for headache if QTc within normal limits. Will check U/A and pregnancy test given lower abdominal tenderness on examination.  10:50pm: CBC shows new microcytic anemia, with hemoglobin of 9.6, MCV 75.9, RDW 18.0, but no leukocytosis with differential and platelet count. Findings are concerning for IDA. CMP shows very mild AKI with creatinine of 1.04 and slightly low albumin of 3.4, but is otherwise unremarkable with normal electrolytes. Urinalysis is unremarkable with negative pregnancy test.   CT head without contrast shows an old small vessel infarct of the left basal ganglia, but no acute intracranial abnormality.  CT cervical spine without contrast shows no acute fracture or static subluxation of the cervical spine.   10:58pm: Will check orthostatic vital signs given patient endorses light-headedness upon standing.   11:28pm: Vital signs were negative for orthostasis. Patient states that she is feeling better.  She is stable for discharge home with return precautions to return for worsening headache, fevers, chills, worsening abdominal pain, worsening light-headedness or dizziness or any other concerning symptoms.    Final Clinical Impression(s) / ED Diagnoses Final diagnoses:  Bradycardia  Microcytic anemia  Nonintractable episodic headache, unspecified headache type    Rx / DC Orders ED Discharge Orders    None     Jeralyn Bennett, MD 01/02/2020, 11:36 PM Pager: [phone]    Jeralyn Bennett, MD 01/02/20 [id number]    Quintella Reichert, MD 01/03/20 0045

## 2020-01-02 NOTE — Discharge Instructions (Addendum)
Heather Barton,   You were evaluated for bilateral frontal headache and blurred vision. Your CT head and neck did not show any acute concerning findings. Your lab work did show a new anemia concerning for iron deficiency anemia and you were found to be bradycardic (to have a slow heart rate) with blood pressure on the low side, although this improved quickly with fluids.  Please be sure to continue drinking plenty of fluids and eating regularly. You may continue to take Tylenol, NSAIDs, or your Topamax as needed for your headache.   Please be sure to follow up with your primary care physician within the next several weeks for your headache, abdominal pain, and microcytic anemia.   Please return to the ED if you experience worsening of your headache, worsening abdominal pain, new fevers or chills, or worsening light-headedness/dizziness upon standing or other concerning findings.   Thank you and take care,  Dr. Konrad Penta

## 2020-01-02 NOTE — ED Notes (Signed)
Discharge instructions discussed with patient. Verbalized understanding. Departs ED at this time in stable condition with follow up care clearly reviewed.

## 2020-01-02 NOTE — ED Notes (Addendum)
To CT

## 2020-07-06 ENCOUNTER — Encounter (HOSPITAL_BASED_OUTPATIENT_CLINIC_OR_DEPARTMENT_OTHER): Payer: Self-pay | Admitting: Emergency Medicine

## 2020-07-06 ENCOUNTER — Other Ambulatory Visit: Payer: Self-pay

## 2020-07-06 ENCOUNTER — Emergency Department (HOSPITAL_BASED_OUTPATIENT_CLINIC_OR_DEPARTMENT_OTHER)
Admission: EM | Admit: 2020-07-06 | Discharge: 2020-07-07 | Disposition: A | Discharge status: Home / Self Care | Payer: Medicaid Other | Attending: Emergency Medicine | Admitting: Emergency Medicine

## 2020-07-06 DIAGNOSIS — Z5321 Procedure and treatment not carried out due to patient leaving prior to being seen by health care provider: Secondary | ICD-10-CM | POA: Insufficient documentation

## 2020-07-06 DIAGNOSIS — M545 Low back pain, unspecified: Secondary | ICD-10-CM | POA: Diagnosis not present

## 2020-07-06 NOTE — ED Notes (Signed)
Registration stated patient left.

## 2020-07-06 NOTE — ED Triage Notes (Signed)
Reports twisting a few days ago when she heard a pop in her lower back.  Reports no relief with ibuprofen 800mg  TID.

## 2021-05-10 ENCOUNTER — Other Ambulatory Visit: Payer: Self-pay

## 2021-05-10 ENCOUNTER — Emergency Department (HOSPITAL_BASED_OUTPATIENT_CLINIC_OR_DEPARTMENT_OTHER)
Admission: EM | Admit: 2021-05-10 | Discharge: 2021-05-11 | Disposition: A | Discharge status: Home / Self Care | Payer: BC Managed Care – PPO | Attending: Emergency Medicine | Admitting: Emergency Medicine

## 2021-05-10 ENCOUNTER — Encounter (HOSPITAL_BASED_OUTPATIENT_CLINIC_OR_DEPARTMENT_OTHER): Payer: Self-pay | Admitting: *Deleted

## 2021-05-10 ENCOUNTER — Emergency Department (HOSPITAL_BASED_OUTPATIENT_CLINIC_OR_DEPARTMENT_OTHER): Payer: BC Managed Care – PPO

## 2021-05-10 DIAGNOSIS — Z86011 Personal history of benign neoplasm of the brain: Secondary | ICD-10-CM | POA: Insufficient documentation

## 2021-05-10 DIAGNOSIS — S82035A Nondisplaced transverse fracture of left patella, initial encounter for closed fracture: Secondary | ICD-10-CM

## 2021-05-10 DIAGNOSIS — Y990 Civilian activity done for income or pay: Secondary | ICD-10-CM | POA: Insufficient documentation

## 2021-05-10 DIAGNOSIS — S8992XA Unspecified injury of left lower leg, initial encounter: Secondary | ICD-10-CM | POA: Diagnosis present

## 2021-05-10 DIAGNOSIS — W01198A Fall on same level from slipping, tripping and stumbling with subsequent striking against other object, initial encounter: Secondary | ICD-10-CM | POA: Diagnosis not present

## 2021-05-10 NOTE — ED Notes (Signed)
Ice pack applied to left knee

## 2021-05-10 NOTE — ED Triage Notes (Signed)
Pt reports she tripped and fell leaving work [employer] and landed on her left knee. C/o pain with bearing weight and bending left knee ?

## 2021-05-11 DIAGNOSIS — S82035A Nondisplaced transverse fracture of left patella, initial encounter for closed fracture: Secondary | ICD-10-CM | POA: Diagnosis not present

## 2021-05-11 MED ORDER — OXYCODONE-ACETAMINOPHEN 5-325 MG PO TABS
1.0000 | ORAL_TABLET | Freq: Once | ORAL | Status: AC
  Administered 2021-05-11: 1 via ORAL
  Filled 2021-05-11: qty 1

## 2021-05-11 NOTE — ED Provider Notes (Signed)
?[facility] ?Provider Note ? ?CSN: [id number] ?Arrival date & time: 05/10/21 2315 ? ?Chief Complaint(s) ?Fall (Knee pain) ? ?HPI ?Heather Barton is a 41 y.o. female   ? ?The history is provided by the patient.  ?Fall ?This is a new problem. The current episode started 1 to 2 hours ago. The problem occurs constantly. The problem has not changed since onset.Pertinent negatives include no chest pain, no abdominal pain, no headaches and no shortness of breath. The symptoms are aggravated by walking. Relieved by: immobility. She has tried nothing for the symptoms.  ? ?Past Medical History ?Past Medical History:  ?Diagnosis Date  ? Bipolar 1 disorder ([place])   ? Brain tumor (benign) ([place])   ? Personality disorder ([place])   ? PTSD (post-traumatic stress disorder)   ? Seizures ([place])   ? last seizure 2 weeks ago  ? Stroke [facility])   ? Substance abuse ([place])   ? ?There are no problems to display for this patient. ? ?Home Medication(s) ?Prior to Admission medications   ?Medication Sig Start Date End Date Taking? Authorizing Provider  ?ARIPiprazole (ABILIFY) 10 MG tablet Take 10 mg by mouth daily.    [provider]  ?busPIRone (BUSPAR) 5 MG tablet TAKE 1 TABLET BY MOUTH THREE TIMES A DAY 06/06/18   [provider]  ?chlorhexidine (PERIDEX) 0.12 % solution Use as directed 15 mLs in the mouth or throat 2 (two) times daily. 05/07/19   Barrie Folk, PA-C  ?diphenhydrAMINE (BENADRYL) 25 MG tablet Take 1 tablet (25 mg total) by mouth every 6 (six) hours. 04/14/15   Fredia Sorrow, MD  ?gabapentin (NEURONTIN) 300 MG capsule Take 600 mg by mouth 3 (three) times daily.     [provider]  ?ibuprofen (ADVIL,MOTRIN) 800 MG tablet Take 1 tablet (800 mg total) by mouth 3 (three) times daily. 12/30/14   Marella Chimes, PA-C  ?levETIRAcetam (KEPPRA) 1000 MG tablet Take 2,000 mg by mouth 2 (two) times daily.     [provider]  ?lidocaine (LIDODERM) 5 % Place 1 patch  onto the skin daily. Remove & Discard patch within 12 hours or as directed by MD 09/28/18   Randal Buba, April, MD  ?methadone (DOLOPHINE) 10 MG tablet Take by mouth.    [provider]  ?methylPREDNISolone (MEDROL DOSEPAK) 4 MG TBPK tablet Use as directed on the package 04/23/19   Long, Wonda Olds, MD  ?naproxen (NAPROSYN) 375 MG tablet Take 1 tablet (375 mg total) by mouth 2 (two) times daily with a meal. 09/28/18   Palumbo, April, MD  ?naproxen (NAPROSYN) 500 MG tablet Take 1 tablet (500 mg total) by mouth 2 (two) times daily. 06/18/16   Fredia Sorrow, MD  ?omeprazole (PRILOSEC) 40 MG capsule Take by mouth daily. 06/24/18   [provider]  ?permethrin (ELIMITE) 5 % cream Apply to entire body once and leave on for 8-12 hours before washing off 07/16/18   Eustaquio Maize, PA-C  ?sertraline (ZOLOFT) 100 MG tablet Take 100 mg by mouth daily.    [provider]  ?traZODone (DESYREL) 100 MG tablet Take 100 mg by mouth at bedtime. Takes 4 tablets     '400mg'$     [provider]  ?                                                                                                                                  ?  Allergies ?Sulfa antibiotics, Aripiprazole, Duloxetine hcl, and Vicodin [hydrocodone-acetaminophen] ? ?Review of Systems ?Review of Systems  ?Respiratory:  Negative for shortness of breath.   ?Cardiovascular:  Negative for chest pain.  ?Gastrointestinal:  Negative for abdominal pain.  ?Neurological:  Negative for headaches.  ?As noted in HPI ? ?Physical Exam ?Vital Signs  ?I have reviewed the triage vital signs ?BP (!) 164/99 (BP Location: Right Arm)   Pulse 95   Temp 98.4 ?F (36.9 ?C) (Oral)   Resp 18   Ht '5\' 6"'$  (1.676 m)   Wt 82.6 kg   LMP 04/04/2021 (Approximate)   SpO2 100%   BMI 29.38 kg/m?  ? ?Physical Exam ?Vitals reviewed.  ?Constitutional:   ?   General: She is not in acute distress. ?   Appearance: She is well-developed. She is not diaphoretic.  ?HENT:  ?   Head: Normocephalic  and atraumatic.  ?   Right Ear: External ear normal.  ?   Left Ear: External ear normal.  ?   Nose: Nose normal.  ?Eyes:  ?   General: No scleral icterus. ?   Conjunctiva/sclera: Conjunctivae normal.  ?Neck:  ?   Trachea: Phonation normal.  ?Cardiovascular:  ?   Rate and Rhythm: Normal rate and regular rhythm.  ?Pulmonary:  ?   Effort: Pulmonary effort is normal. No respiratory distress.  ?   Breath sounds: No stridor.  ?Abdominal:  ?   General: There is no distension.  ?Musculoskeletal:     ?   General: Normal range of motion.  ?   Cervical back: Normal range of motion.  ?   Left knee: Bony tenderness present. No swelling, deformity or ecchymosis. Normal range of motion. Tenderness present over the medial joint line and patellar tendon.  ?   Comments: Still able to extend to 180 degree at left knee  ?Neurological:  ?   Mental Status: She is alert and oriented to person, place, and time.  ?Psychiatric:     ?   Behavior: Behavior normal.  ? ? ?ED Results and Treatments ?Labs ?(all labs ordered are listed, but only abnormal results are displayed) ?Labs Reviewed - No data to display                                                                                                                       ?EKG ? EKG Interpretation ? ?Date/Time:    ?Ventricular Rate:    ?PR Interval:    ?QRS Duration:   ?QT Interval:    ?QTC Calculation:   ?R Axis:     ?Text Interpretation:   ?  ? ?  ? ?Radiology ?DG Knee Complete 4 Views Left ? ?Result Date: 05/11/2021 ?CLINICAL DATA:  Fall tonight on concrete.  Felt a crack. EXAM: LEFT KNEE - COMPLETE 4+ VIEW COMPARISON:  None. FINDINGS: Minimal medial tibiofemoral joint space narrowing. Normal alignment. Suspected nondisplaced fracture through the mid inferior patellar pole. No other fracture. No significant knee joint effusion. Mild soft  tissue edema anteriorly. IMPRESSION: Suspected nondisplaced fracture through the mid inferior patellar pole. Electronically Signed   By: Keith Rake  M.D.   On: 05/11/2021 00:05   ? ?Pertinent labs & imaging results that were available during my care of the patient were reviewed by me and considered in my medical decision making (see MDM for details). ? ?Medications Ordered in ED ?Medications  ?oxyCODONE-acetaminophen (PERCOCET/ROXICET) 5-325 MG per tablet 1 tablet (1 tablet Oral Given 05/11/21 0123)  ?                                                               ?                                                                    ?Procedures ?Procedures ? ?(including critical care time) ? ?Medical Decision Making / ED Course ? ? ? Complexity of Problem: ? ?Co-morbidities/SDOH that complicate the patient evaluation/care: ?Chronic pain on methadone ? ?Additional history obtained: ?none ? ?Patient's presenting problem/concern and DDX listed below: ?Fall with left knee pain ?Most concerning for patellar injury.  Will assess for any other bony injuries with plain film. ? ? ? ?  Complexity of Data: ?  ?Cardiac Monitoring: ?None ? ?Laboratory Tests ordered listed below with my independent interpretation: ?None ?  ?Imaging Studies ordered listed below with my independent interpretation: ?X-rays concerning for patellar fracture. ?Confirmed by radiology ?  ?  ?ED Course:   ? ?Hospitalization Considered:  ?No ? ?Assessment, Intervention, and Reassessment: ?Left patella fracture ?Nondisplaced. ?Still able to extend at the left knee. ?Knee immobilizer and crutches provided ?Given oral pain medicine.  ?Instructed to follow-up with her pain specialist for additional pain management. ? ? ?Final Clinical Impression(s) / ED Diagnoses ?Final diagnoses:  ?Closed nondisplaced transverse fracture of left patella, initial encounter  ? ?The patient appears reasonably screened and/or stabilized for discharge and I doubt any other medical condition or other [facility] requiring further screening, evaluation, or treatment in the ED at this time prior to discharge. Safe for discharge with strict  return precautions. ? ?Disposition: Discharge ? ?Condition: Good ? ?I have discussed the results, Dx and Tx plan with the patient/family who expressed understanding and agree(s) with the plan. Discharge instr

## 2021-05-11 NOTE — Discharge Instructions (Addendum)
For pain control you may take at 1000 mg of Tylenol every 8 hours scheduled.  ?

## 2021-06-03 ENCOUNTER — Other Ambulatory Visit: Payer: Self-pay

## 2021-06-03 ENCOUNTER — Encounter (HOSPITAL_BASED_OUTPATIENT_CLINIC_OR_DEPARTMENT_OTHER): Payer: Self-pay | Admitting: Emergency Medicine

## 2021-06-03 ENCOUNTER — Emergency Department (HOSPITAL_BASED_OUTPATIENT_CLINIC_OR_DEPARTMENT_OTHER)
Admission: EM | Admit: 2021-06-03 | Discharge: 2021-06-03 | Disposition: A | Discharge status: Home / Self Care | Payer: BC Managed Care – PPO | Attending: Emergency Medicine | Admitting: Emergency Medicine

## 2021-06-03 DIAGNOSIS — L509 Urticaria, unspecified: Secondary | ICD-10-CM | POA: Diagnosis not present

## 2021-06-03 DIAGNOSIS — R21 Rash and other nonspecific skin eruption: Secondary | ICD-10-CM | POA: Diagnosis present

## 2021-06-03 MED ORDER — PREDNISONE 10 MG PO TABS: 10.0000 mg | ORAL_TABLET | Freq: Every day | ORAL | 0 refills | Status: DC

## 2021-06-03 MED ORDER — PREDNISONE 20 MG PO TABS
20.0000 mg | ORAL_TABLET | Freq: Once | ORAL | Status: AC
  Administered 2021-06-03: 20 mg via ORAL
  Filled 2021-06-03: qty 1

## 2021-06-03 NOTE — Discharge Instructions (Addendum)
You have been seen and discharged from the emergency department.  Continue to take Benadryl 25 to 50 mg every 6-8 hours for the next 2 days.  Take steroid prescription as prescribed.  Stay well-hydrated.  Follow-up with your primary provider for further evaluation and further care. Take home medications as prescribed. If you have any worsening symptoms or further concerns for your health please return to an emergency department for further evaluation. ?

## 2021-06-03 NOTE — ED Provider Notes (Signed)
Has never ?[facility] ?Provider Note ? ? ?CSN: [id number] ?Arrival date & time: 06/03/21  1749 ? ?  ? ?History ? ?Chief Complaint  ?Patient presents with  ? Rash  ? ? ?Heather Barton is a 41 y.o. female. ? ?HPI ? ?41 year old female presents to the emergency department with rash.  Patient states that she woke up this morning with itchiness in her bilateral forearms.  After she started itching the redness and rash extended to her chest, upper back and abdomen.  She has had allergic rashes in the past.  She has had allergy testing had severe allergic reaction/anaphylaxis.  States she took 1 dose of Benadryl prior to arrival and feels improved.  Denies any mouth/airway swelling.  No wheezing/cough.  No nausea/vomiting. ? ?Home Medications ?Prior to Admission medications   ?Medication Sig Start Date End Date Taking? Authorizing Provider  ?ARIPiprazole (ABILIFY) 10 MG tablet Take 10 mg by mouth daily.    [provider]  ?busPIRone (BUSPAR) 5 MG tablet TAKE 1 TABLET BY MOUTH THREE TIMES A DAY 06/06/18   [provider]  ?chlorhexidine (PERIDEX) 0.12 % solution Use as directed 15 mLs in the mouth or throat 2 (two) times daily. 05/07/19   Barrie Folk, PA-C  ?diphenhydrAMINE (BENADRYL) 25 MG tablet Take 1 tablet (25 mg total) by mouth every 6 (six) hours. 04/14/15   Fredia Sorrow, MD  ?gabapentin (NEURONTIN) 300 MG capsule Take 600 mg by mouth 3 (three) times daily.     [provider]  ?ibuprofen (ADVIL,MOTRIN) 800 MG tablet Take 1 tablet (800 mg total) by mouth 3 (three) times daily. 12/30/14   Marella Chimes, PA-C  ?levETIRAcetam (KEPPRA) 1000 MG tablet Take 2,000 mg by mouth 2 (two) times daily.     [provider]  ?lidocaine (LIDODERM) 5 % Place 1 patch onto the skin daily. Remove & Discard patch within 12 hours or as directed by MD 09/28/18   Randal Buba, April, MD  ?methadone (DOLOPHINE) 10 MG tablet Take by mouth.    [provider]  ?methylPREDNISolone (MEDROL DOSEPAK) 4 MG TBPK tablet Use as directed on the package 04/23/19   Long, Wonda Olds, MD  ?naproxen (NAPROSYN) 375 MG tablet Take 1 tablet (375 mg total) by mouth 2 (two) times daily with a meal. 09/28/18   Palumbo, April, MD  ?naproxen (NAPROSYN) 500 MG tablet Take 1 tablet (500 mg total) by mouth 2 (two) times daily. 06/18/16   Fredia Sorrow, MD  ?omeprazole (PRILOSEC) 40 MG capsule Take by mouth daily. 06/24/18   [provider]  ?permethrin (ELIMITE) 5 % cream Apply to entire body once and leave on for 8-12 hours before washing off 07/16/18   Eustaquio Maize, PA-C  ?sertraline (ZOLOFT) 100 MG tablet Take 100 mg by mouth daily.    [provider]  ?traZODone (DESYREL) 100 MG tablet Take 100 mg by mouth at bedtime. Takes 4 tablets     '400mg'$     [provider]  ?   ? ?Allergies    ?Sulfa antibiotics, Aripiprazole, Duloxetine hcl, and Vicodin [hydrocodone-acetaminophen]   ? ?Review of Systems   ?Review of Systems  ?Constitutional:  Negative for fever.  ?HENT:  Negative for facial swelling.   ?Respiratory:  Negative for shortness of breath and wheezing.   ?Cardiovascular:  Negative for chest pain.  ?Gastrointestinal:  Negative for nausea and vomiting.  ?Skin:  Positive for rash.  ? ?Physical Exam ?Updated Vital Signs ?BP (!) 140/99 (  BP Location: Right Arm)   Pulse 87   Temp 98.6 ?F (37 ?C) (Oral)   Resp 18   Ht '5\' 6"'$  (1.676 m)   Wt 82.6 kg   SpO2 99%   BMI 29.37 kg/m?  ?Physical Exam ?Vitals and nursing note reviewed.  ?Constitutional:   ?   General: She is not in acute distress. ?   Appearance: Normal appearance. She is not diaphoretic.  ?HENT:  ?   Head: Normocephalic.  ?   Comments: No facial swelling ?   Mouth/Throat:  ?   Mouth: Mucous membranes are moist.  ?Cardiovascular:  ?   Rate and Rhythm: Normal rate.  ?Pulmonary:  ?   Effort: Pulmonary effort is normal. No respiratory distress.  ?   Breath sounds: No wheezing.  ?Skin: ?   General: Skin is warm.   ?   Comments: Urticarial rash from the forearms extending to the chest, upper back and upper abdomen  ?Neurological:  ?   Mental Status: She is alert and oriented to person, place, and time. Mental status is at baseline.  ? ? ?ED Results / Procedures / Treatments   ?Labs ?(all labs ordered are listed, but only abnormal results are displayed) ?Labs Reviewed - No data to display ? ?EKG ?None ? ?Radiology ?No results found. ? ?Procedures ?Procedures  ? ? ?Medications Ordered in ED ?Medications  ?predniSONE (DELTASONE) tablet 20 mg (has no administration in time range)  ? ? ?ED Course/ Medical Decision Making/ A&P ?  ?                        ?Medical Decision Making ?Risk ?Prescription drug management. ? ? ?41 year old female presents emergency department complaining of itchy red rash.  Noticed it mainly this morning.  Took 1 dose of Benadryl prior to arrival with improvement.  She has had rashes similar to this in the past that have required steroid treatment.  No history of severe allergic reaction or anaphylaxis.  Does not have an EpiPen.  Has performed allergy testing with her primary doctor previously. ? ?Vital signs are stable here.  Patient has an urticarial itchy rash.  No signs of facial swelling/oral involvement.  No symptoms of anaphylaxis, breath sounds are clear.  Plan to treat with Benadryl and steroids and outpatient follow-up.  Patient at this time appears safe and stable for discharge and close outpatient follow up. Discharge plan and strict return to ED precautions discussed, patient verbalizes understanding and agreement. ? ? ? ? ? ? ? ?Final Clinical Impression(s) / ED Diagnoses ?Final diagnoses:  ?None  ? ? ?Rx / DC Orders ?ED Discharge Orders   ? ? None  ? ?  ? ? ?  ?Lorelle Gibbs, DO ?06/03/21 2979 ? ?

## 2021-06-03 NOTE — ED Triage Notes (Signed)
Rash itchy since yesterday  all over  trunk, head , fine red rash  ?

## 2021-10-27 ENCOUNTER — Emergency Department (HOSPITAL_BASED_OUTPATIENT_CLINIC_OR_DEPARTMENT_OTHER): Payer: Medicaid Other

## 2021-10-27 ENCOUNTER — Emergency Department (HOSPITAL_BASED_OUTPATIENT_CLINIC_OR_DEPARTMENT_OTHER)
Admission: EM | Admit: 2021-10-27 | Discharge: 2021-10-27 | Disposition: A | Discharge status: Home / Self Care | Payer: Medicaid Other | Attending: Emergency Medicine | Admitting: Emergency Medicine

## 2021-10-27 ENCOUNTER — Other Ambulatory Visit: Payer: Self-pay

## 2021-10-27 ENCOUNTER — Encounter (HOSPITAL_BASED_OUTPATIENT_CLINIC_OR_DEPARTMENT_OTHER): Payer: Self-pay | Admitting: Emergency Medicine

## 2021-10-27 DIAGNOSIS — S3992XA Unspecified injury of lower back, initial encounter: Secondary | ICD-10-CM | POA: Diagnosis present

## 2021-10-27 DIAGNOSIS — S39012A Strain of muscle, fascia and tendon of lower back, initial encounter: Secondary | ICD-10-CM | POA: Diagnosis not present

## 2021-10-27 DIAGNOSIS — Y92009 Unspecified place in unspecified non-institutional (private) residence as the place of occurrence of the external cause: Secondary | ICD-10-CM | POA: Diagnosis not present

## 2021-10-27 DIAGNOSIS — X500XXA Overexertion from strenuous movement or load, initial encounter: Secondary | ICD-10-CM | POA: Diagnosis not present

## 2021-10-27 LAB — URINALYSIS, ROUTINE W REFLEX MICROSCOPIC
Bilirubin Urine: NEGATIVE
Glucose, UA: NEGATIVE mg/dL
Hgb urine dipstick: NEGATIVE
Ketones, ur: NEGATIVE mg/dL
Leukocytes,Ua: NEGATIVE
Nitrite: NEGATIVE
Protein, ur: NEGATIVE mg/dL
Specific Gravity, Urine: <=1.005 (ref 1.005–1.030)
pH: 5.5 (ref 5.0–8.0)

## 2021-10-27 LAB — PREGNANCY, URINE: Preg Test, Ur: NEGATIVE

## 2021-10-27 MED ORDER — IBUPROFEN 800 MG PO TABS: 800.0000 mg | ORAL_TABLET | Freq: Three times a day (TID) | ORAL | 0 refills | Status: DC

## 2021-10-27 MED ORDER — METHOCARBAMOL 500 MG PO TABS: 500.0000 mg | ORAL_TABLET | Freq: Two times a day (BID) | ORAL | 0 refills | Status: AC

## 2021-10-27 MED ORDER — KETOROLAC TROMETHAMINE 30 MG/ML IJ SOLN
15.0000 mg | Freq: Once | INTRAMUSCULAR | Status: AC
  Administered 2021-10-27: 15 mg via INTRAMUSCULAR
  Filled 2021-10-27: qty 1

## 2021-10-27 MED ORDER — LIDOCAINE 5 % EX PTCH: 1.0000 | MEDICATED_PATCH | CUTANEOUS | 0 refills | Status: DC

## 2021-10-27 NOTE — ED Provider Notes (Signed)
[facility] EMERGENCY DEPARTMENT Provider Note   CSN: [id number] Arrival date & time: 10/27/21  1308     History  Chief Complaint  Patient presents with   Back Pain    Heather Barton is a 41 y.o. female. With past medical history of seizures, stroke, substance use disorder who presents to the emergency department with back pain.   States she began having back pain about 10 days ago.  States that around 10 days ago they were rearranging the house and she was lifting a number of cardboard boxes at a time which she states were heavy.  She does not state that she remembers sudden pain at that time.  Pain has been gradually worsening.  States that the pain radiates up the spine with movement or sitting up.  She denies having pain that radiates down her legs.  She does endorse feeling weak in her legs although she has not fallen.  Does not feel like her feet are dragging.  She denies having numbness or tingling to the legs, saddle anesthesia.  She relates that she feels like she is having difficulty emptying her bladder.  States that after she urinates she feels like she still has to go.  This has been new since starting to have back pain.  She denies any fevers or IV drug use.  Denies trauma to the back.   Back Pain      Home Medications Prior to Admission medications   Medication Sig Start Date End Date Taking? Authorizing Provider  ibuprofen (ADVIL) 800 MG tablet Take 1 tablet (800 mg total) by mouth 3 (three) times daily. 10/27/21  Yes Mickie Hillier, PA-C  lidocaine (LIDODERM) 5 % Place 1 patch onto the skin daily. Remove & Discard patch within 12 hours or as directed by MD 10/27/21  Yes Mickie Hillier, PA-C  methocarbamol (ROBAXIN) 500 MG tablet Take 1 tablet (500 mg total) by mouth 2 (two) times daily. 10/27/21  Yes Mickie Hillier, PA-C  ARIPiprazole (ABILIFY) 10 MG tablet Take 10 mg by mouth daily.    [provider]  busPIRone (BUSPAR) 5 MG tablet TAKE 1 TABLET BY  MOUTH THREE TIMES A DAY 06/06/18   [provider]  chlorhexidine (PERIDEX) 0.12 % solution Use as directed 15 mLs in the mouth or throat 2 (two) times daily. 05/07/19   Walisiewicz, Harley Hallmark, PA-C  diphenhydrAMINE (BENADRYL) 25 MG tablet Take 1 tablet (25 mg total) by mouth every 6 (six) hours. 04/14/15   Fredia Sorrow, MD  gabapentin (NEURONTIN) 300 MG capsule Take 600 mg by mouth 3 (three) times daily.     [provider]  levETIRAcetam (KEPPRA) 1000 MG tablet Take 2,000 mg by mouth 2 (two) times daily.     [provider]  methadone (DOLOPHINE) 10 MG tablet Take by mouth.    [provider]  naproxen (NAPROSYN) 375 MG tablet Take 1 tablet (375 mg total) by mouth 2 (two) times daily with a meal. 09/28/18   Palumbo, April, MD  naproxen (NAPROSYN) 500 MG tablet Take 1 tablet (500 mg total) by mouth 2 (two) times daily. 06/18/16   Fredia Sorrow, MD  omeprazole (PRILOSEC) 40 MG capsule Take by mouth daily. 06/24/18   [provider]  permethrin (ELIMITE) 5 % cream Apply to entire body once and leave on for 8-12 hours before washing off 07/16/18   Alroy Bailiff, Margaux, PA-C  predniSONE (DELTASONE) 10 MG tablet Take 1 tablet (10 mg total) by  mouth daily. 06/03/21   Horton, Alvin Critchley, DO  sertraline (ZOLOFT) 100 MG tablet Take 100 mg by mouth daily.    [provider]  traZODone (DESYREL) 100 MG tablet Take 100 mg by mouth at bedtime. Takes 4 tablets     '400mg'$     [provider]      Allergies    Sulfa antibiotics, Aripiprazole, Duloxetine hcl, and Vicodin [hydrocodone-acetaminophen]    Review of Systems   Review of Systems  Genitourinary:  Positive for difficulty urinating.  Musculoskeletal:  Positive for back pain.  All other systems reviewed and are negative.   Physical Exam Updated Vital Signs BP (!) 110/48   Pulse 80   Temp 98 F (36.7 C) (Oral)   Resp 15   Ht '5\' 6"'$  (1.676 m)   Wt 82.6 kg   SpO2 97%   BMI 29.38 kg/m   Physical Exam Vitals and nursing note reviewed.  Constitutional:      General: She is not in acute distress.    Appearance: Normal appearance. She is not ill-appearing or toxic-appearing.  HENT:     Head: Normocephalic and atraumatic.  Eyes:     General: No scleral icterus. Cardiovascular:     Pulses: Normal pulses.  Pulmonary:     Effort: Pulmonary effort is normal. No respiratory distress.  Abdominal:     Palpations: Abdomen is soft.  Musculoskeletal:        General: Normal range of motion.     Cervical back: No tenderness.     Thoracic back: No bony tenderness.     Lumbar back: Bony tenderness present.       Back:     Comments: Bony and paraspinal tenderness to palpation of the lumbar spine.  There is no swelling or obvious step-off or deformity.  There is 5/5 in bilateral lower extremities.  Sensation is intact.  Skin:    General: Skin is warm and dry.     Capillary Refill: Capillary refill takes less than 2 seconds.     Findings: No bruising, erythema or rash.  Neurological:     General: No focal deficit present.     Mental Status: She is alert and oriented to person, place, and time. Mental status is at baseline.     Sensory: No sensory deficit.     Motor: No weakness.  Psychiatric:        Mood and Affect: Mood normal.        Behavior: Behavior normal.        Thought Content: Thought content normal.        Judgment: Judgment normal.     ED Results / Procedures / Treatments   Labs (all labs ordered are listed, but only abnormal results are displayed) Labs Reviewed  URINALYSIS, ROUTINE W REFLEX MICROSCOPIC - Abnormal; Notable for the following components:      Result Value   Color, Urine STRAW (*)    All other components within normal limits  PREGNANCY, URINE    EKG None  Radiology CT Lumbar Spine Wo Contrast  Result Date: 10/27/2021 CLINICAL DATA:  Compression fracture, lumbar EXAM: CT LUMBAR SPINE WITHOUT CONTRAST TECHNIQUE: Multidetector CT imaging of  the lumbar spine was performed without intravenous contrast administration. Multiplanar CT image reconstructions were also generated. RADIATION DOSE REDUCTION: This exam was performed according to the departmental dose-optimization program which includes automated exposure control, adjustment of the mA and/or kV according to patient size and/or use of iterative reconstruction technique. COMPARISON:  MRI  04/13/2019, CT 04/12/2019 FINDINGS: Segmentation: 5 lumbar type vertebrae. Alignment: Normal. Vertebrae: No acute fracture or focal pathologic process. Unchanged benign bone island in the right sacrum. Paraspinal and other soft tissues: Negative Disc levels: There is minimal disc bulging at L3-L4, L4-L5, and L5-S1. There is multilevel facet arthropathy, severe on the left at L5-S1. Mild spinal canal stenosis at L4-L5. Mild left neural foraminal stenosis at L5-S1. Findings are unchanged since February 2021. IMPRESSION: No acute lumbar spine fracture. Minimal degenerative changes of the lower lumbar spine, most prominent at L5-S1 with severe left-sided facet arthropathy and mild left-sided neural foraminal stenosis at this level. Unchanged mild spinal canal narrowing at L4-L5. Electronically Signed   By: Maurine Simmering M.D.   On: 10/27/2021 17:58    Procedures Procedures    Medications Ordered in ED Medications  ketorolac (TORADOL) 30 MG/ML injection 15 mg (15 mg Intramuscular Given 10/27/21 1730)    ED Course/ Medical Decision Making/ A&P Clinical Course as of 10/27/21 1813  Mon Oct 27, 2021  1721 Postvoid residual of 18 to 23 mL based on bladder scan.  Have low suspicion for urinary retention.  We will proceed with CT of the lumbar spine. [LA]    Clinical Course User Index [LA] Mickie Hillier, PA-C                           Medical Decision Making Amount and/or Complexity of Data Reviewed Labs: ordered. Radiology: ordered.  Risk Prescription drug management.  This patient presents to the ED  with chief complaint(s) of low back pain with pertinent past medical history of stroke, seizures which further complicates the presenting complaint. The complaint involves an extensive differential diagnosis and also carries with it a high risk of complications and morbidity.    The differential diagnosis includes Fracture, subluxation, musculoskeletal strain, epidural abscess, cauda equina, muscle spasm, sciatica or radiculopathy, etc.    Additional history obtained: Additional history obtained from significant other Records reviewed Care Everywhere/External Records  ED Course and Reassessment: 41 year old female who presents to the emergency department with low back pain most consistent with lumbar strain. Physical exam with midline tenderness to palpation.  She also has bilateral paraspinal muscular tenderness.  There is no sciatica symptoms.  No history of trauma although she was doing some heavy lifting about 10 days ago when the symptoms began.  She discusses feeling like she is not emptying her bladder which seems to have started after her back pain.  This is initially concerning for cauda equina.  She has no bowel incontinence or retention, saddle anesthesia.  She feels subjectively weak in her lower extremities however objectively she does not have any weakness.  Given the possible retention, obtained a postvoid residual which only shows 18-23 mL in her bladder.  Do not feel that she is retaining.  Also obtained a UA which is negative for UTI.  Given this finding we will proceed with CT of her lumbar spine rather than transfer for MRI. CT findings are above.  There is no acute fracture or subluxation.  No cauda equina or significant canal stenosis.  There is no evidence of osteomyelitis.  She has no infectious symptoms or history of IV drug use concerning for epidural abscess.  There is no flank pain concerning for renal colic.  No urinary symptoms such as dysuria.  There is no CVA tenderness on  my exam and she is afebrile so doubt pyelonephritis.  Do not feel  that this is AAA or viscus perforation. Given the clinical picture, likely musculoskeletal strain.  I will provide her with lidocaine patches and muscle relaxant and ibuprofen. Will also refer to ortho spine. She is instructed to follow-up with primary care provider.  Will return to the emergency department if she has fall or significantly worsening symptoms.  She verbalized understanding.  Independent labs interpretation:  The following labs were independently interpreted: UA negative  Independent visualization of imaging: - I independently visualized the following imaging with scope of interpretation limited to determining acute life threatening conditions related to emergency care: CT of the lumbar spine, which revealed no acute fracture.  She does have degenerative changes at L5-S1 with left-sided facet arthropathy and mild left-sided neuroforaminal stenosis.  Consultation: - Consulted or discussed management/test interpretation w/ external professional: Not indicated  Consideration for admission or further workup: Not indicated Social Determinants of health: none identified  Final Clinical Impression(s) / ED Diagnoses Final diagnoses:  Strain of lumbar region, initial encounter    Rx / DC Orders ED Discharge Orders          Ordered    methocarbamol (ROBAXIN) 500 MG tablet  2 times daily        10/27/21 1812    ibuprofen (ADVIL) 800 MG tablet  3 times daily        10/27/21 1812    lidocaine (LIDODERM) 5 %  Every 24 hours        10/27/21 1812    Ambulatory referral to Orthopedic Surgery        10/27/21 1813              Mickie Hillier, PA-C 10/27/21 1813    Elgie Congo, MD 10/28/21 0145

## 2021-10-27 NOTE — ED Triage Notes (Signed)
Patient presents to ED via POV from home. Here with right lower back pain x 1 week. Reports recent lifting/moving. Ambulatory.

## 2021-10-27 NOTE — Discharge Instructions (Addendum)
You were seen in the emergency department today with low back pain.  This is likely musculoskeletal strain.  I am providing you with a muscle relaxant as well as lidocaine patches for your back.  You may also use Tylenol and Motrin for relief of pain.  Please perform gentle range of motion and stretching.  Please return if you are unable to walk or have changes to your bowel and bladder habits or have numbness or tingling to your groin.  I have sent a referral to the orthopedic surgeon.  If you do not receive a call in 3 days I have provided her information in your discharge paperwork to follow-up for an appointment.

## 2021-10-27 NOTE — ED Notes (Signed)
Bladder scan performed. 18-23 mL shown on scan.

## 2021-10-31 ENCOUNTER — Ambulatory Visit (INDEPENDENT_AMBULATORY_CARE_PROVIDER_SITE_OTHER): Payer: No Typology Code available for payment source | Admitting: Physician Assistant

## 2021-10-31 ENCOUNTER — Encounter: Payer: Self-pay | Admitting: Physician Assistant

## 2021-10-31 DIAGNOSIS — M545 Low back pain, unspecified: Secondary | ICD-10-CM | POA: Insufficient documentation

## 2021-10-31 DIAGNOSIS — M5440 Lumbago with sciatica, unspecified side: Secondary | ICD-10-CM

## 2021-10-31 MED ORDER — MELOXICAM 15 MG PO TABS: 15.0000 mg | ORAL_TABLET | Freq: Every day | ORAL | 0 refills | Status: AC

## 2021-10-31 NOTE — Progress Notes (Signed)
Office Visit Note   Patient: Heather Barton           Date of Birth: 06/02/1980           MRN: [id number] Visit Date: 10/31/2021              Requested by: Robert Bellow, PA-C [address] [place],  [place] [address] PCP: Robert Bellow, PA-C  Chief Complaint  Patient presents with   Lower Back - New Patient (Initial Visit)      HPI: Ms. Randleman is a pleasant 41 year old woman who has had an ongoing history of low back pain.  She has had an MRI in 2021.  More recently she was moving some cardboard boxes around her house and noticed that her beginning began to worsen.  She was seen in the emergency room where she was found to be neurologically intact but a CT was performed as she stated she sometimes had difficulty emptying her bladder..  She does have a history of seizure disorder and stroke though is not on any anticoagulants.  She was sent home with anti-inflammatory as well as muscle relaxant.  She was also given some lidocaine patches.  She find the lidocaine patches and the muscle relaxant was helpful however her insurance did not cover the lidocaine patches and they were quite expensive for her.  She has tried getting over-the-counter substitutes.  She denies any weakness loss of bowel or bladder control and describes her pain in the lower back sometimes radiates into her thighs left side more prominent than right denies any fever chills or recent illness  Assessment & Plan: Visit Diagnoses:  1. Acute bilateral low back pain with sciatica, sciatica laterality unspecified     Plan: Patient is neurologically intact today.  I did review both her MRI and her CT. 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  It was thought that this certainly could be associated with low back pain.  Her CT scan scan done a few days ago demonstrates no acute fracture with severe left-sided facet arthropathy and mild  left-sided neuroforaminal stenosis.  She was more symptomatic on the left.  I have recommended physical therapy and have made a referral.  I think she would also benefit from a facet injection possibly with Dr. Ernestina Patches.  Also have changed her anti-inflammatory from ibuprofen to meloxicam.  She will follow-up with me if she does not have any relief with these modalities and treatment  Follow-Up Instructions: Return after PT and injection.   Ortho Exam  Patient is alert, oriented, no adenopathy, well-dressed, normal affect, normal respiratory effort. Examination pleasant 41 year old woman.  She has no pain with forward flexion she does have some increase in her lower back pain with extension.  No real pain with side to side bending.  Her strength in her lower extremities is 5 out of 5 bilaterally with resisted plantarflexion dorsiflexion extension and flexion of her legs and hips.  She does have a negative straight leg raise on the right but a straight leg raise on the left recreates her pain in her lower back.  Lower back is tender to palpation but there is no deformity no redness no cellulitis.  Has more symptoms in the left buttock than the right.  Sensation is intact distally  Imaging: No results found. No images are attached to the encounter.  Labs: Lab Results  Component Value Date  REPTSTATUS 03/24/2014 FINAL 03/22/2014   CULT  03/22/2014    No Beta Hemolytic Streptococci Isolated Performed at [place] [facility] 08/17/2009     Lab Results  Component Value Date   ALBUMIN 3.4 (L) 01/02/2020   ALBUMIN 2.6 (L) 11/05/2018   ALBUMIN 3.8 04/14/2015    No results found for: "MG" No results found for: "VD25OH"  No results found for: "PREALBUMIN"    Latest Ref Rng & Units 01/02/2020   10:15 PM 04/12/2019    7:00 PM 11/05/2018   10:28 PM  CBC EXTENDED  WBC 4.0 - 10.5 K/uL 5.4  4.6  9.5   RBC 3.87 - 5.11 MIL/uL 4.10  4.36  4.24   Hemoglobin 12.0 - 15.0  g/dL 9.6  12.4  12.6   HCT 36.0 - 46.0 % 31.1  37.7  39.6   Platelets 150 - 400 K/uL 192  245  272   NEUT# 1.7 - 7.7 K/uL 2.6  3.1  6.1   Lymph# 0.7 - 4.0 K/uL 2.5  1.3  2.9      There is no height or weight on file to calculate BMI.  Orders:  Orders Placed This Encounter  Procedures   Ambulatory referral to Physical Therapy   Ambulatory referral to Physical Medicine Rehab   Meds ordered this encounter  Medications   meloxicam (MOBIC) 15 MG tablet    Sig: Take 1 tablet (15 mg total) by mouth daily.    Dispense:  30 tablet    Refill:  0     Procedures: No procedures performed  Clinical Data: No additional findings.  ROS:  All other systems negative, except as noted in the HPI. Review of Systems  Objective: Vital Signs: There were no vitals taken for this visit.  Specialty Comments:  No specialty comments available.  PMFS History: Patient Active Problem List   Diagnosis Date Noted   Low back pain 10/31/2021   Past Medical History:  Diagnosis Date   Bipolar 1 disorder ([place])    Brain tumor (benign) ([place])    Personality disorder ([place])    PTSD (post-traumatic stress disorder)    Seizures ([place])    last seizure 2 weeks ago   Stroke [facility])    Substance abuse ([place])     History reviewed. No pertinent family history.  Past Surgical History:  Procedure Laterality Date   LEG SURGERY     TUBAL LIGATION     TUMOR REMOVAL     WRIST SURGERY     Social History   Occupational History   Not on file  Tobacco Use   Smoking status: Every Day    Packs/day: 1.00    Types: Cigarettes   Smokeless tobacco: Never  Vaping Use   Vaping Use: Every day  Substance and Sexual Activity   Alcohol use: Not Currently   Drug use: Not Currently   Sexual activity: Yes    Birth control/protection: Condom

## 2021-11-03 ENCOUNTER — Telehealth: Payer: Self-pay | Admitting: Physician Assistant

## 2021-11-03 ENCOUNTER — Other Ambulatory Visit: Payer: Self-pay | Admitting: Physician Assistant

## 2021-11-03 MED ORDER — TIZANIDINE HCL 4 MG PO TABS: 4.0000 mg | ORAL_TABLET | Freq: Four times a day (QID) | ORAL | 0 refills | Status: AC | PRN

## 2021-11-03 NOTE — Telephone Encounter (Signed)
Please advise 

## 2021-11-03 NOTE — Telephone Encounter (Signed)
Pt called requesting muscle relaxer. Pt states she seen PA Audrea Muscat last wk and she was to send muscle relaxer to pharmacy. Pt phone number is [phone].

## 2021-11-03 NOTE — Progress Notes (Unsigned)
Zanaflex

## 2021-11-11 ENCOUNTER — Ambulatory Visit: Payer: Medicaid Other | Attending: Physician Assistant | Admitting: Physical Therapy

## 2021-11-12 ENCOUNTER — Encounter: Payer: Self-pay | Admitting: Physical Medicine and Rehabilitation

## 2021-11-12 ENCOUNTER — Ambulatory Visit (INDEPENDENT_AMBULATORY_CARE_PROVIDER_SITE_OTHER): Payer: Medicaid Other | Admitting: Physical Medicine and Rehabilitation

## 2021-11-12 DIAGNOSIS — G8929 Other chronic pain: Secondary | ICD-10-CM

## 2021-11-12 DIAGNOSIS — M47816 Spondylosis without myelopathy or radiculopathy, lumbar region: Secondary | ICD-10-CM

## 2021-11-12 DIAGNOSIS — M545 Low back pain, unspecified: Secondary | ICD-10-CM

## 2021-11-12 MED ORDER — DIAZEPAM 5 MG PO TABS: ORAL_TABLET | ORAL | 0 refills | Status: AC

## 2021-11-12 NOTE — Progress Notes (Signed)
Low back pain; pain is a 7 today.  Pain does go down into the left leg, does say that she has numbness/tingling. Pain wakes her from her sleep.   Pain with activities can exceed a 10.   Takes meloxicam for the pain, doesn't help as much

## 2021-11-12 NOTE — Progress Notes (Signed)
[name] - 41 y.o. female MRN [id number]  Date of birth: Dec 05, 1980  Office Visit Note: Visit Date: 11/12/2021 PCP: Robert Bellow, PA-C Referred by: Robert Bellow, PA-C  Subjective: Chief Complaint  Patient presents with   Lower Back - Pain   HPI: Heather Barton is a 41 y.o. female who comes in today per the request of Bevely Palmer Persons, PA for evaluation of chronic, worsening and severe bilateral lower back pain, left greater than right with intermittent radiation of pain to left thigh region. Bilateral lower back pain is most severe. Pain ongoing for several months and is exacerbated by standing and walking, she describes pain as sore and aching in nature, currently rates as 8 out of 10. States pain is severe when moving from sitting to standing position and with standing and performing tasks at work, states she can hear "popping" sensation to lower back. Some relief of pain with home exercise regimen, rest and medications. Patient is scheduled to start formal physical therapy this week at [facility]. Lumbar MRI imaging from 2021 exhibits bilateral facet osteoarthritis at L4-5 and L5-S1 with mild edema, no neural impingement, no stenosis noted. Patient was recently evaluated in the ED for increased lower back pain after lifting cardboard boxes at home. During this visit she reports difficulty emptying bladder, CT of lumbar spine negative for any acute findings, does exhibit multilevel facet arthropathy, most severe on the left at L5-S1. Patient denies history of lumbar injections/surgery. Patient denies focal weakness, numbness and tingling. Patient denies recent trauma or falls.    Review of Systems  Musculoskeletal:  Positive for back pain.  Neurological:  Negative for tingling, sensory change, focal weakness and weakness.  All other systems reviewed and are negative.  Otherwise per HPI.  Assessment & Plan: Visit Diagnoses:    ICD-10-CM   1.  Chronic bilateral low back pain without sciatica  M54.50 Ambulatory referral to Physical Medicine Rehab   G89.29     2. Spondylosis without myelopathy or radiculopathy, lumbar region  M47.816 Ambulatory referral to Physical Medicine Rehab    3. Facet arthropathy, lumbar  M47.816 Ambulatory referral to Physical Medicine Rehab       Plan: Findings:  Chronic, worsening and severe bilateral lower back pain, left greater than right with intermittent radiation of pain to left thigh region. Bilateral lower back pain is most severe. She continues to have severe pain despite good conservative therapies such as home exercise regimen, rest and use of medications. Patients clinical presentation and exam are consistent with facet mediated pain. She does have significant pain with lumbar extension today. Next step is to perform diagnostic and hopefully therapeutic bilateral L4-L5 and L5-S1 facet joint/medial branch blocks under fluoroscopic guidance. If patient gets good relief of pain with diagnostic facet blocks we did briefly discuss possibility of longer sustained pain relief with radiofrequency ablation. Patient did voice concerns about anxiety related to procedure, I did prescribe pre-procedure Valium for her to take on day of injection. No red flag symptoms noted upon exam today.     Meds & Orders:  Meds ordered this encounter  Medications   diazepam (VALIUM) 5 MG tablet    Sig: Take one tablet by mouth with food one hour prior to procedure. May repeat 30 minutes prior if needed.    Dispense:  2 tablet    Refill:  0    Orders Placed This Encounter  Procedures   Ambulatory referral to Physical Medicine  Rehab    Follow-up: Return for Bilateral L4-L5 and L5-S1 facet joint/medial branch blocks.   Procedures: No procedures performed      Clinical History: No specialty comments available.   She reports that she has been smoking cigarettes. She has been smoking an average of 1 pack per day. She  has never used smokeless tobacco. No results for input(s): "HGBA1C", "LABURIC" in the last 8760 hours.  Objective:  VS:  HT:    WT:   BMI:     BP:   HR: bpm  TEMP: ( )  RESP:  Physical Exam Vitals and nursing note reviewed.  HENT:     Head: Normocephalic and atraumatic.     Right Ear: External ear normal.     Left Ear: External ear normal.     Nose: Nose normal.     Mouth/Throat:     Mouth: Mucous membranes are moist.  Eyes:     Extraocular Movements: Extraocular movements intact.  Cardiovascular:     Rate and Rhythm: Normal rate.     Pulses: Normal pulses.  Pulmonary:     Effort: Pulmonary effort is normal.  Abdominal:     General: Abdomen is flat. There is no distension.  Musculoskeletal:        General: Tenderness present.     Cervical back: Normal range of motion.     Comments: Pt is slow to rise from seated position to standing. Concordant low back pain with facet loading, lumbar spine extension and rotation. Strong distal strength without clonus, no pain upon palpation of greater trochanters. Sensation intact bilaterally. Walks independently, gait steady.   Skin:    General: Skin is warm and dry.     Capillary Refill: Capillary refill takes less than 2 seconds.  Neurological:     General: No focal deficit present.     Mental Status: She is alert and oriented to person, place, and time.  Psychiatric:        Mood and Affect: Mood normal.        Behavior: Behavior normal.     Ortho Exam  Imaging: No results found.  Past Medical/Family/Surgical/Social History: Medications & Allergies reviewed per EMR, new medications updated. Patient Active Problem List   Diagnosis Date Noted   Low back pain 10/31/2021   Past Medical History:  Diagnosis Date   Bipolar 1 disorder ([place])    Brain tumor (benign) ([place])    Personality disorder ([place])    PTSD (post-traumatic stress disorder)    Seizures ([place])    last seizure 2 weeks ago   Stroke [facility])    Substance abuse ([place])     History reviewed. No pertinent family history. Past Surgical History:  Procedure Laterality Date   LEG SURGERY     TUBAL LIGATION     TUMOR REMOVAL     WRIST SURGERY     Social History   Occupational History   Not on file  Tobacco Use   Smoking status: Every Day    Packs/day: 1.00    Types: Cigarettes   Smokeless tobacco: Never  Vaping Use   Vaping Use: Every day  Substance and Sexual Activity   Alcohol use: Not Currently   Drug use: Not Currently   Sexual activity: Yes    Birth control/protection: Condom

## 2021-11-18 ENCOUNTER — Ambulatory Visit: Payer: Medicaid Other

## 2021-11-24 ENCOUNTER — Ambulatory Visit: Payer: No Typology Code available for payment source | Attending: Physician Assistant | Admitting: Physical Therapy

## 2021-11-24 ENCOUNTER — Encounter: Payer: Self-pay | Admitting: Physical Therapy

## 2021-11-24 ENCOUNTER — Other Ambulatory Visit: Payer: Self-pay

## 2021-11-24 DIAGNOSIS — M5459 Other low back pain: Secondary | ICD-10-CM | POA: Diagnosis present

## 2021-11-24 DIAGNOSIS — M62830 Muscle spasm of back: Secondary | ICD-10-CM | POA: Diagnosis present

## 2021-11-24 DIAGNOSIS — M5416 Radiculopathy, lumbar region: Secondary | ICD-10-CM | POA: Diagnosis present

## 2021-11-24 DIAGNOSIS — R262 Difficulty in walking, not elsewhere classified: Secondary | ICD-10-CM | POA: Insufficient documentation

## 2021-11-24 DIAGNOSIS — M5440 Lumbago with sciatica, unspecified side: Secondary | ICD-10-CM | POA: Diagnosis not present

## 2021-11-24 DIAGNOSIS — M6281 Muscle weakness (generalized): Secondary | ICD-10-CM | POA: Diagnosis present

## 2021-11-24 NOTE — Therapy (Signed)
OUTPATIENT PHYSICAL THERAPY THORACOLUMBAR EVALUATION   Patient Name: Heather Barton MRN: [id number] DOB:19-Mar-1980, 41 y.o., female Today's Date: 11/24/2021   PT End of Session - 11/24/21 1532     Visit Number 1    Number of Visits 16    Date for PT Re-Evaluation 01/19/22    Authorization Type Medicaid    PT Start Time 1532    PT Stop Time 1624    PT Time Calculation (min) 52 min    Activity Tolerance Patient tolerated treatment well    Behavior During Therapy WFL for tasks assessed/performed;Flat affect             Past Medical History:  Diagnosis Date   Bipolar 1 disorder ([place])    Brain tumor (benign) ([place])    Personality disorder ([place])    PTSD (post-traumatic stress disorder)    Seizures ([place])    last seizure 2 weeks ago   Stroke [facility])    Substance abuse ([place])    Past Surgical History:  Procedure Laterality Date   LEG SURGERY     TUBAL LIGATION     TUMOR REMOVAL     WRIST SURGERY     Patient Active Problem List   Diagnosis Date Noted   Low back pain 10/31/2021    PCP: Rip Harbour   REFERRING PROVIDER: Persons, Bevely Palmer, PA   REFERRING DIAG: M54.40 (ICD-10-CM) - Acute bilateral low back pain with sciatica, sciatica laterality unspecified  THERAPY DIAG:  Other low back pain  Radiculopathy, lumbar region  Muscle weakness (generalized)  Muscle spasm of back  Difficulty in walking, not elsewhere classified  RATIONALE FOR EVALUATION AND TREATMENT: Rehabilitation  ONSET DATE: Chronic  NEXT MD VISIT: PRN pending response to ESI (11/27/21) and PT   SUBJECTIVE:                                                                                                                                                                                           SUBJECTIVE STATEMENT: Pt reports pain in B low back and upper buttocks for the past few years. She also notes the bones grind and pop in her low back. She reports h/o "tailbone" fracture in her 11's  and states the pain started a few years later. She reports change/worsening of her LBP following a L patella fracture in March of the this year for which she was initially in a knee immobilizer, then a knee brace. Pain interferes with sleeping and most household chores. She is scheduled to receive ESI on 11/27/21 with Dr. Ernestina Patches.   PAIN:  Are you having pain? Yes: NPRS scale: 3-4/10  Pain location: B low back and upper buttocks Pain description: Stabbing, burning, sometimes numb Aggravating factors: twisting, bending, sweeping the floor, standing too long washing dishes Relieving factors: pillow btw legs when sleeping, laying on her stomach, ibuprofen, muscle relaxant, ice pack, topical analgesic  PERTINENT HISTORY: Seizures, CVA, bipolar 1 disorder, PTSD  PRECAUTIONS: None  WEIGHT BEARING RESTRICTIONS: No  FALLS:  Has patient fallen in last 6 months? Yes. 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   Minimal degenerative changes of the lower lumbar spine, most prominent at L5-S1 with severe left-sided facet arthropathy and mild left-sided neural foraminal stenosis at this level. Unchanged mild spinal canal narrowing at L4-L5.  PATIENT SURVEYS:  Modified Oswestry 24 / 50 = 48.0% - severe disability   SCREENING FOR RED FLAGS: Bowel or bladder incontinence: Yes: when  feeling nauseous Spinal tumors: No Cauda equina syndrome: No Compression fracture: No Abdominal aneurysm: No  COGNITION:  Overall cognitive status: Within functional limits for tasks assessed     SENSATION: WFL Except intermittent numbness down L LE to mid calf  MUSCLE LENGTH: Hamstrings: very mild tight B ITB: mild tight B Piriformis: mild tight R>L Hip flexors: mild/mod tight B Quads: mild tight B Heelcord: WFL  POSTURE:  rounded shoulders, forward head, increased lumbar lordosis, increased thoracic kyphosis, and anterior pelvic tilt  PALPATION: Increased muscle tension and TTP in B thoracolumbar paraspinals, glutes and piriformis. Lumbar and sacral hypomobility with CPAs.  LUMBAR ROM:   Active  AROM  Eval - 11/24/21  Flexion Fingertips to floor - tight  Extension 25% limited - pain  Right lateral flexion WFL - pain in L low back  Left lateral flexion WFL - tight in R low back  Right rotation WFL  Left rotation WFL   (Blank rows = not tested)  LOWER EXTREMITY ROM:    WFL  LOWER EXTREMITY MMT:    MMT Right eval Left eval  Hip flexion 4+ 4  Hip extension 4 4  Hip abduction 4 4  Hip adduction 4+ 4  Hip internal rotation 4+ 4  Hip external rotation 4 4-  Knee flexion 4 4  Knee extension 4+ 4-  Ankle dorsiflexion 4 4  Ankle plantarflexion 5- 4  Ankle inversion    Ankle eversion     (Blank rows = not tested)  LUMBAR SPECIAL TESTS:  Straight leg raise test: Negative and FABER test: Negative   TODAY'S TREATMENT: Access Code: [email or web address] URL: [email or web address] Date: 11/24/2021 Prepared by: Annie Paras  Exercises - Supine Single Knee to Chest  - 2-3 x daily - 7 x weekly - 3 reps - 30 sec hold - Supine Piriformis Stretch Pulling Heel to Hip  - 2-3 x daily - 7 x weekly - 3 reps - 30 sec hold - Supine Lower Trunk Rotation  - 2-3 x daily - 7 x weekly -  5 reps - 10-15 sec hold - Supine Posterior Pelvic Tilt  - 2-3 x daily - 7 x weekly - 2  sets - 10 reps - 5 sec hold - Static Prone on Elbows  - 2-3 x daily - 7 x weekly - 2 sets - 3 reps - 30 sec hold - Prone Press Up  - 2-3 x daily - 7 x weekly - 2 sets - 10 reps - 2 sec hold   PATIENT EDUCATION:  Education details: PT eval findings, anticipated POC, and initial HEP Person educated: Patient Education method: Explanation, Demonstration, Verbal cues, and Handouts Education comprehension: verbalized understanding, returned demonstration, verbal cues required, and needs further education   HOME EXERCISE PROGRAM: Access Code: [email or web address] URL: [email or web address] Date: 11/24/2021 Prepared by: Annie Paras  Exercises - Supine Single Knee to Chest  - 2-3 x daily - 7 x weekly - 3 reps - 30 sec hold - Supine Piriformis Stretch Pulling Heel to Hip  - 2-3 x daily - 7 x weekly - 3 reps - 30 sec hold - Supine Lower Trunk Rotation  - 2-3 x daily - 7 x weekly - 5 reps - 10-15 sec hold - Supine Posterior Pelvic Tilt  - 2-3 x daily - 7 x weekly - 2 sets - 10 reps - 5 sec hold - Static Prone on Elbows  - 2-3 x daily - 7 x weekly - 2 sets - 3 reps - 30 sec hold - Prone Press Up  - 2-3 x daily - 7 x weekly - 2 sets - 10 reps - 2 sec hold  ASSESSMENT:  CLINICAL IMPRESSION: DEMYAH SMYRE is a 41 y.o. female who was seen today for physical therapy evaluation and treatment for chronic LBP with intermittent L LE radiculopathy. She reports several year h/o LBP which may be related to "tailbone" fracture when she was in her 25s, but seems to have been exacerbated following a fracture of her L patella in March of this year. Pain impacts all positional tolerance as well as most ADLs and household chores. Lumbar ROM essentially WFL except slight limitation in extension, but pain and tightness noted with most motions. Additional deficits include abnormal posture, mildly limited proximal flexibility, increased muscle tension and TTP in thoracolumbar paraspinals and glutes/piriformis, along  with core/lumbopelvic and LE weakness, L>R. Seleen will benefit from skilled PT to address above deficits to improve activity tolerance with decreased pain interference.  OBJECTIVE IMPAIRMENTS: decreased activity tolerance, decreased endurance, decreased knowledge of condition, decreased mobility, difficulty walking, decreased ROM, decreased strength, hypomobility, increased fascial restrictions, impaired perceived functional ability, increased muscle spasms, impaired flexibility, impaired sensation, improper body mechanics, postural dysfunction, and pain.   ACTIVITY LIMITATIONS: carrying, lifting, bending, sitting, standing, squatting, sleeping, stairs, transfers, bed mobility, bathing, toileting, dressing, and caring for others  PARTICIPATION LIMITATIONS: meal prep, cleaning, laundry, shopping, community activity, and school  PERSONAL FACTORS: Fitness, Past/current experiences, Time since onset of injury/illness/exacerbation, and 3+ comorbidities: Seizures, CVA, bipolar 1 disorder, PTSD  are also affecting patient's functional outcome.   REHAB POTENTIAL: Good  CLINICAL DECISION MAKING: Evolving/moderate complexity  EVALUATION COMPLEXITY: Moderate   GOALS: Goals reviewed with patient? Yes  SHORT TERM GOALS: Target date: 12/08/2021   Patient will be independent with initial HEP to improve outcomes and carryover.  Baseline: Initial HEP provided on eval Goal status: INITIAL  2.  Patient will report centralization of radicular symptoms.  Baseline: Intermittent L LE radicular numbness to calf Goal status: INITIAL  3.  Patient to  verbalize understanding of good spinal alignment/posturing and body mechanics needed for daily activities. Baseline: Flexed trunk posture with forward head, rounded shoulders, increased positional kyphosis and lumbar lordosis with anterior pelvic tilt Goal status: INITIAL   LONG TERM GOALS: Target date: 01/19/2022    Patient will be independent with  ongoing/advanced HEP for self-management at home.  Baseline: Initial HEP provided on eval Goal status: INITIAL  2.  Patient will report 75% improvement in low back pain to improve QOL.  Baseline: 3-4/10 Goal status: INITIAL  3.  Patient to demonstrate ability to achieve and maintain good spinal alignment/posturing and body mechanics needed for daily activities. Baseline: Flexed trunk posture with forward head, rounded shoulders, increased positional kyphosis and lumbar lordosis with anterior pelvic tilt Goal status: INITIAL  4.  Patient will demonstrate full pain free lumbar ROM to perform ADLs.   Baseline: 25% limitation in lumbar extension, but pain and tightness noted with most motions Goal status: INITIAL  5.  Patient will demonstrate improved B LE strength to >/= 4+/5 for improved stability and ease of mobility . Baseline: Refer to above MMT table Goal status: INITIAL  6.  Patient will report </= 36% on Modified Oswestry to demonstrate improved functional ability.  Baseline:  24 / 50 = 48.0% Goal status: INITIAL   7.  Patient will tolerate >30-45 min of sitting or standing to perform normal daily ADLs and school-related tasks. Baseline: Pain prevents her from sitting or standing for >30 min. Goal status: INITIAL   PLAN: PT FREQUENCY: 2x/week  PT DURATION: 8 weeks  PLANNED INTERVENTIONS: Therapeutic exercises, Therapeutic activity, Neuromuscular re-education, Balance training, Gait training, Patient/Family education, Self Care, Joint mobilization, Stair training, Dry Needling, Spinal mobilization, Cryotherapy, Moist heat, Taping, Traction, Ultrasound, Ionotophoresis '4mg'$ /ml Dexamethasone, Manual therapy, and Re-evaluation.  PLAN FOR NEXT SESSION: Review initial HEP; progress lumbopelvic flexibility and stabilization/strengthening; posture and body mechanics education; MT +/- DN to address pain and abnormal muscle tension in thoracolumbar paraspinals and glutes/piriformis;  modalities PRN for pain   Percival Spanish, PT 11/24/2021, 5:18 PM

## 2021-11-26 ENCOUNTER — Ambulatory Visit: Payer: No Typology Code available for payment source

## 2021-11-26 DIAGNOSIS — M5459 Other low back pain: Secondary | ICD-10-CM | POA: Diagnosis not present

## 2021-11-26 DIAGNOSIS — M62830 Muscle spasm of back: Secondary | ICD-10-CM

## 2021-11-26 DIAGNOSIS — M6281 Muscle weakness (generalized): Secondary | ICD-10-CM

## 2021-11-26 DIAGNOSIS — M5416 Radiculopathy, lumbar region: Secondary | ICD-10-CM

## 2021-11-26 DIAGNOSIS — R262 Difficulty in walking, not elsewhere classified: Secondary | ICD-10-CM

## 2021-11-26 NOTE — Therapy (Signed)
OUTPATIENT PHYSICAL THERAPY TREATMENT   Patient Name: Heather Barton MRN: [id number] DOB:1981/02/10, 41 y.o., female Today's Date: 11/26/2021   PT End of Session - 11/26/21 1522     Visit Number 2    Number of Visits 16    Date for PT Re-Evaluation 01/19/22    Authorization Type Medicaid    Authorization Time Period 11/26/21- 12/06/21    Authorization - Visit Number 1    Authorization - Number of Visits 3    PT Start Time 3149    PT Stop Time 1529    PT Time Calculation (min) 42 min    Activity Tolerance Patient tolerated treatment well    Behavior During Therapy WFL for tasks assessed/performed              Past Medical History:  Diagnosis Date   Bipolar 1 disorder ([place])    Brain tumor (benign) ([place])    Personality disorder ([place])    PTSD (post-traumatic stress disorder)    Seizures ([place])    last seizure 2 weeks ago   Stroke [facility])    Substance abuse ([place])    Past Surgical History:  Procedure Laterality Date   LEG SURGERY     TUBAL LIGATION     TUMOR REMOVAL     WRIST SURGERY     Patient Active Problem List   Diagnosis Date Noted   Low back pain 10/31/2021    PCP: Robert Bellow, PA-C   REFERRING PROVIDER: Persons, Bevely Palmer, PA   REFERRING DIAG: M54.40 (ICD-10-CM) - Acute bilateral low back pain with sciatica, sciatica laterality unspecified  THERAPY DIAG:  Other low back pain  Radiculopathy, lumbar region  Muscle weakness (generalized)  Muscle spasm of back  Difficulty in walking, not elsewhere classified  RATIONALE FOR EVALUATION AND TREATMENT: Rehabilitation  ONSET DATE: Chronic  NEXT MD VISIT: PRN pending response to ESI (11/27/21) and PT   SUBJECTIVE:                                                                                                                                                                                           SUBJECTIVE STATEMENT: Woke up with a crook on L side of neck, the lower back is tight today, hurt a  lot yesterday so did not do exercises.   PAIN:  Are you having pain? No-stiffness  PERTINENT HISTORY: Seizures, CVA, bipolar 1 disorder, PTSD  PRECAUTIONS: None  WEIGHT BEARING RESTRICTIONS: No  FALLS:  Has patient fallen in last 6 months? Yes. 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   Minimal degenerative changes of the lower lumbar spine, most prominent at L5-S1 with severe left-sided facet arthropathy and mild left-sided neural foraminal stenosis at this level. Unchanged mild spinal canal narrowing at L4-L5.  PATIENT SURVEYS:  Modified Oswestry 24 / 50 = 48.0% - severe disability   SCREENING FOR RED FLAGS: Bowel or bladder incontinence: Yes: when feeling nauseous Spinal tumors: No Cauda equina syndrome: No Compression fracture: No Abdominal aneurysm: No  COGNITION:  Overall cognitive status: Within functional limits for tasks assessed     SENSATION: WFL Except intermittent numbness down L LE to mid calf  MUSCLE LENGTH: Hamstrings: very mild tight B ITB: mild tight B Piriformis: mild tight R>L Hip flexors: mild/mod tight B Quads: mild tight B Heelcord: WFL  POSTURE:  rounded shoulders, forward head, increased lumbar lordosis, increased thoracic kyphosis, and anterior pelvic tilt  PALPATION: Increased muscle tension and TTP in B thoracolumbar  paraspinals, glutes and piriformis. Lumbar and sacral hypomobility with CPAs.  LUMBAR ROM:   Active  AROM  Eval - 11/24/21  Flexion Fingertips to floor - tight  Extension 25% limited - pain  Right lateral flexion WFL - pain in L low back  Left lateral flexion WFL - tight in R low back  Right rotation WFL  Left rotation WFL   (Blank rows = not tested)  LOWER EXTREMITY ROM:    WFL  LOWER EXTREMITY MMT:    MMT Right eval Left eval  Hip flexion 4+ 4  Hip extension 4 4  Hip abduction 4 4  Hip adduction 4+ 4  Hip internal rotation 4+ 4  Hip external rotation 4 4-  Knee flexion 4 4  Knee extension 4+ 4-  Ankle dorsiflexion 4 4  Ankle plantarflexion 5- 4  Ankle inversion    Ankle eversion     (Blank rows = not tested)  LUMBAR SPECIAL TESTS:  Straight leg raise test: Negative and FABER test: Negative   TODAY'S TREATMENT: 11/26/21 TherEx: Nustep L4x35mn SKTC stretch 2x30" bil Piriformis stretch 2x30" bil LTR supine 2x 15 sec hold bil Pelvic tilts in hooklying x 10 Seated lumbar flexion stretch 2x30 sec green pball  L thomas stretch x 30 sec Supine bridge x 10 Supine clam red TB x 10   Access Code: [email or web address] URL: [email or web address] Date: 11/24/2021 Prepared by: JAnnie Paras Exercises - Supine Single Knee to Chest  - 2-3 x daily - 7 x weekly - 3 reps - 30 sec hold - Supine Piriformis Stretch Pulling Heel to Hip  - 2-3 x daily - 7 x weekly - 3 reps - 30 sec hold - Supine Lower Trunk Rotation  - 2-3 x daily - 7 x weekly - 5 reps - 10-15 sec hold - Supine Posterior Pelvic Tilt  - 2-3 x daily - 7 x weekly - 2 sets - 10 reps - 5 sec hold - Static Prone on Elbows  - 2-3 x  daily - 7 x weekly - 2 sets - 3 reps - 30 sec hold - Prone Press Up  - 2-3 x daily - 7 x weekly - 2 sets - 10 reps - 2 sec hold   PATIENT EDUCATION:  Education details: PT eval findings, anticipated POC, and initial HEP Person educated: Patient Education method: Explanation,  Demonstration, Verbal cues, and Handouts Education comprehension: verbalized understanding, returned demonstration, verbal cues required, and needs further education   HOME EXERCISE PROGRAM: Access Code: [email or web address] URL: [email or web address] Date: 11/26/2021 Prepared by: Clarene Essex  Exercises - Supine Single Knee to Chest  - 2-3 x daily - 7 x weekly - 3 reps - 30 sec hold - Supine Piriformis Stretch Pulling Heel to Hip  - 2-3 x daily - 7 x weekly - 3 reps - 30 sec hold - Supine Lower Trunk Rotation  - 2-3 x daily - 7 x weekly - 5 reps - 10-15 sec hold - Supine Posterior Pelvic Tilt  - 2-3 x daily - 7 x weekly - 2 sets - 10 reps - 5 sec hold - Prone Press Up  - 2-3 x daily - 7 x weekly - 2 sets - 10 reps - 2 sec hold - Hip Flexor Stretch at Edge of Bed  - 1 x daily - 7 x weekly - 3 sets - 30 sec hold - Supine Bridge  - 1 x daily - 7 x weekly - 2 sets - 10 reps - Hooklying Clamshell with Resistance  - 1 x daily - 7 x weekly - 2 sets - 10 reps  ASSESSMENT:  CLINICAL IMPRESSION: Pt had reported pain yesterday, so she had not tried any of her HEP. Reviewed initial HEP, with patient demonstrating good understanding of exercises. She was pretty tight with thomas stretch, so I added this to HEP along with lumbopelvic strengthening exercises. Pt demonstrated a good respopnse to treatment, will continue progressing exercises.  OBJECTIVE IMPAIRMENTS: decreased activity tolerance, decreased endurance, decreased knowledge of condition, decreased mobility, difficulty walking, decreased ROM, decreased strength, hypomobility, increased fascial restrictions, impaired perceived functional ability, increased muscle spasms, impaired flexibility, impaired sensation, improper body mechanics, postural dysfunction, and pain.   ACTIVITY LIMITATIONS: carrying, lifting, bending, sitting, standing, squatting, sleeping, stairs, transfers, bed mobility, bathing, toileting, dressing, and caring for  others  PARTICIPATION LIMITATIONS: meal prep, cleaning, laundry, shopping, community activity, and school  PERSONAL FACTORS: Fitness, Past/current experiences, Time since onset of injury/illness/exacerbation, and 3+ comorbidities: Seizures, CVA, bipolar 1 disorder, PTSD  are also affecting patient's functional outcome.   REHAB POTENTIAL: Good  CLINICAL DECISION MAKING: Evolving/moderate complexity  EVALUATION COMPLEXITY: Moderate   GOALS: Goals reviewed with patient? Yes  SHORT TERM GOALS: Target date: 12/08/2021   Patient will be independent with initial HEP to improve outcomes and carryover.  Baseline: Initial HEP provided on eval Goal status: IN PROGRESS  2.  Patient will report centralization of radicular symptoms.  Baseline: Intermittent L LE radicular numbness to calf Goal status: IN PROGRESS  3.  Patient to verbalize understanding of good spinal alignment/posturing and body mechanics needed for daily activities. Baseline: Flexed trunk posture with forward head, rounded shoulders, increased positional kyphosis and lumbar lordosis with anterior pelvic tilt Goal status: IN PROGRESS   LONG TERM GOALS: Target date: 01/19/2022    Patient will be independent with ongoing/advanced HEP for self-management at home.  Baseline: Initial HEP provided on eval Goal status: IN PROGRESS  2.  Patient will report 75% improvement in low back pain to improve  QOL.  Baseline: 3-4/10 Goal status: IN PROGRESS  3.  Patient to demonstrate ability to achieve and maintain good spinal alignment/posturing and body mechanics needed for daily activities. Baseline: Flexed trunk posture with forward head, rounded shoulders, increased positional kyphosis and lumbar lordosis with anterior pelvic tilt Goal status: IN PROGRESS  4.  Patient will demonstrate full pain free lumbar ROM to perform ADLs.   Baseline: 25% limitation in lumbar extension, but pain and tightness noted with most motions Goal  status: IN PROGRESS  5.  Patient will demonstrate improved B LE strength to >/= 4+/5 for improved stability and ease of mobility . Baseline: Refer to above MMT table Goal status: IN PROGRESS  6.  Patient will report </= 36% on Modified Oswestry to demonstrate improved functional ability.  Baseline:  24 / 50 = 48.0% Goal status: IN PROGRESS   7.  Patient will tolerate >30-45 min of sitting or standing to perform normal daily ADLs and school-related tasks. Baseline: Pain prevents her from sitting or standing for >30 min. Goal status: IN PROGRESS   PLAN: PT FREQUENCY: 2x/week  PT DURATION: 8 weeks  PLANNED INTERVENTIONS: Therapeutic exercises, Therapeutic activity, Neuromuscular re-education, Balance training, Gait training, Patient/Family education, Self Care, Joint mobilization, Stair training, Dry Needling, Spinal mobilization, Cryotherapy, Moist heat, Taping, Traction, Ultrasound, Ionotophoresis '4mg'$ /ml Dexamethasone, Manual therapy, and Re-evaluation.  PLAN FOR NEXT SESSION:  progress lumbopelvic flexibility and stabilization/strengthening; posture and body mechanics education; MT +/- DN to address pain and abnormal muscle tension in thoracolumbar paraspinals and glutes/piriformis; modalities PRN for pain   Artist Pais, PTA 11/26/2021, 4:06 PM

## 2021-11-27 ENCOUNTER — Ambulatory Visit: Payer: Self-pay

## 2021-11-27 ENCOUNTER — Ambulatory Visit (INDEPENDENT_AMBULATORY_CARE_PROVIDER_SITE_OTHER): Payer: No Typology Code available for payment source | Admitting: Physical Medicine and Rehabilitation

## 2021-11-27 VITALS — BP 116/76 | HR 94

## 2021-11-27 DIAGNOSIS — M47816 Spondylosis without myelopathy or radiculopathy, lumbar region: Secondary | ICD-10-CM | POA: Diagnosis not present

## 2021-11-27 MED ORDER — METHYLPREDNISOLONE ACETATE 80 MG/ML IJ SUSP
80.0000 mg | Freq: Once | INTRAMUSCULAR | Status: AC
  Administered 2021-11-27: 80 mg

## 2021-11-27 NOTE — Progress Notes (Unsigned)
Mgy, .14 sec

## 2021-11-27 NOTE — Patient Instructions (Signed)

## 2021-12-01 ENCOUNTER — Encounter: Payer: Self-pay | Admitting: Physical Therapy

## 2021-12-01 ENCOUNTER — Ambulatory Visit: Payer: No Typology Code available for payment source | Admitting: Physical Therapy

## 2021-12-01 DIAGNOSIS — M5459 Other low back pain: Secondary | ICD-10-CM

## 2021-12-01 DIAGNOSIS — R262 Difficulty in walking, not elsewhere classified: Secondary | ICD-10-CM

## 2021-12-01 DIAGNOSIS — M6281 Muscle weakness (generalized): Secondary | ICD-10-CM

## 2021-12-01 DIAGNOSIS — M62830 Muscle spasm of back: Secondary | ICD-10-CM

## 2021-12-01 DIAGNOSIS — M5416 Radiculopathy, lumbar region: Secondary | ICD-10-CM

## 2021-12-01 NOTE — Therapy (Signed)
OUTPATIENT PHYSICAL THERAPY TREATMENT   Patient Name: Heather Barton MRN: [id number] DOB:[birth year], 41 y.o., female Today's Date: 12/01/2021   PT End of Session - 12/01/21 1402     Visit Number 3    Number of Visits 16    Date for PT Re-Evaluation 01/19/22    Authorization Type Medicaid    Authorization Time Period 11/26/21- 12/06/21    Authorization - Visit Number 2    Authorization - Number of Visits 3    PT Start Time 1402    PT Stop Time 1440    PT Time Calculation (min) 38 min    Activity Tolerance Patient tolerated treatment well    Behavior During Therapy WFL for tasks assessed/performed               Past Medical History:  Diagnosis Date   Bipolar 1 disorder ([place])    Brain tumor (benign) ([place])    Personality disorder ([place])    PTSD (post-traumatic stress disorder)    Seizures ([place])    last seizure 2 weeks ago   Stroke [facility])    Substance abuse ([place])    Past Surgical History:  Procedure Laterality Date   LEG SURGERY     TUBAL LIGATION     TUMOR REMOVAL     WRIST SURGERY     Patient Active Problem List   Diagnosis Date Noted   Low back pain 10/31/2021    PCP: Rip Harbour   REFERRING PROVIDER: Persons, Bevely Palmer, PA   REFERRING DIAG: M54.40 (ICD-10-CM) - Acute bilateral low back pain with sciatica, sciatica laterality unspecified  THERAPY DIAG:  Other low back pain  Radiculopathy, lumbar region  Muscle weakness (generalized)  Muscle spasm of back  Difficulty in walking, not elsewhere classified  RATIONALE FOR EVALUATION AND TREATMENT: Rehabilitation  ONSET DATE: Chronic  NEXT MD VISIT: PRN pending response to ESI (11/27/21) and PT   SUBJECTIVE:                                                                                                                                                                                           SUBJECTIVE STATEMENT: Pt reports she thinks the ESI injections made her pain worse. No pain  currently but increased pain with popping and cracking last night.  PAIN:  Are you having pain? No-stiffness  PERTINENT HISTORY: Seizures, CVA, bipolar 1 disorder, PTSD  PRECAUTIONS: None  WEIGHT BEARING RESTRICTIONS: No  FALLS:  Has patient fallen in last 6 months? Yes. 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   Minimal degenerative changes of the lower lumbar spine, most prominent at L5-S1 with severe left-sided facet arthropathy and mild left-sided neural foraminal stenosis at this level. Unchanged mild spinal canal narrowing at L4-L5.  PATIENT SURVEYS:  Modified Oswestry 24 / 50 = 48.0% - severe disability   SCREENING FOR RED FLAGS: Bowel or bladder incontinence: Yes: when feeling nauseous Spinal tumors: No Cauda equina syndrome: No Compression fracture: No Abdominal aneurysm: No  COGNITION:  Overall cognitive status: Within functional limits for tasks assessed     SENSATION: WFL Except intermittent numbness down L LE to mid calf  MUSCLE LENGTH: Hamstrings: very mild tight B ITB: mild tight B Piriformis: mild tight R>L Hip flexors: mild/mod tight B Quads: mild tight B Heelcord: WFL  POSTURE:  rounded shoulders, forward head, increased lumbar lordosis, increased thoracic kyphosis, and anterior pelvic tilt  PALPATION: Increased muscle tension  and TTP in B thoracolumbar paraspinals, glutes and piriformis. Lumbar and sacral hypomobility with CPAs.  LUMBAR ROM:   Active  AROM  Eval - 11/24/21  Flexion Fingertips to floor - tight  Extension 25% limited - pain  Right lateral flexion WFL - pain in L low back  Left lateral flexion WFL - tight in R low back  Right rotation WFL  Left rotation WFL   (Blank rows = not tested)  LOWER EXTREMITY ROM:    WFL  LOWER EXTREMITY MMT:    MMT Right eval Left eval  Hip flexion 4+ 4  Hip extension 4 4  Hip abduction 4 4  Hip adduction 4+ 4  Hip internal rotation 4+ 4  Hip external rotation 4 4-  Knee flexion 4 4  Knee extension 4+ 4-  Ankle dorsiflexion 4 4  Ankle plantarflexion 5- 4  Ankle inversion    Ankle eversion     (Blank rows = not tested)  LUMBAR SPECIAL TESTS:  Straight leg raise test: Negative and FABER test: Negative   TODAY'S TREATMENT:  12/01/21 THERAPEUTIC EXERCISE: to improve flexibility, strength and mobility.  Verbal and tactile cues throughout for technique. Rec Bike - L3 x 6 min Seated 3-way lumbar flexion stretch with green Pball 2 x 30" each position  Hooklying TrA + RTB hip flexion march 10 x 3" Hooklying TrA + RTB hip ABD/ER clam 10 x 3" Bridge + RTB hip ABD isometric 10 x 3" - limited lift (L lower than R) R/L sidelying RTB clam 10 x 3"  SELF CARE:  Provided education in proper posture and body mechanics for typical daily positioning and household chores to minimize strain on low back and neck.   11/26/21 TherEx: Nustep L4x53mn SKTC stretch 2x30" bil Piriformis stretch 2x30" bil LTR supine 2x 15 sec hold bil Pelvic tilts in hooklying x 10 Seated lumbar flexion stretch 2x30 sec green pball  L thomas stretch x 30 sec Supine bridge x 10 Supine clam red TB x 10   PATIENT EDUCATION:  Education  details: PT eval findings, anticipated POC, and initial HEP Person educated: Patient Education method: Explanation, Demonstration, Verbal cues, and  Handouts Education comprehension: verbalized understanding, returned demonstration, verbal cues required, and needs further education   HOME EXERCISE PROGRAM: Access Code: [email or web address] URL: [email or web address] Date: 12/01/2021 Prepared by: Annie Paras  Exercises - Supine Single Knee to Chest  - 2-3 x daily - 7 x weekly - 3 reps - 30 sec hold - Supine Piriformis Stretch Pulling Heel to Hip  - 2-3 x daily - 7 x weekly - 3 reps - 30 sec hold - Supine Lower Trunk Rotation  - 2-3 x daily - 7 x weekly - 5 reps - 10-15 sec hold - Supine Posterior Pelvic Tilt  - 2-3 x daily - 7 x weekly - 2 sets - 10 reps - 5 sec hold - Prone Press Up  - 2-3 x daily - 7 x weekly - 2 sets - 10 reps - 2 sec hold - Hip Flexor Stretch at Edge of Bed  - 1 x daily - 7 x weekly - 3 sets - 30 sec hold - Supine Bridge  - 1 x daily - 7 x weekly - 2 sets - 10 reps - Hooklying Clamshell with Resistance  - 1 x daily - 7 x weekly - 2 sets - 10 reps  Patient Education - Posture and Body Mechanics  ASSESSMENT:  CLINICAL IMPRESSION: Coralynn reports her pain has been worse since the ESI. She denies any questions or concerns with the HEP but only completing them 2 days/wk - reminded her of prescribed frequency for current exercises: at least daily for all exercises and up to 2-3x/day for stretches. Attempted to progress exercises but limited tolerance due c/o popping in low back/SIJ. SIJ alignment assessed with alignment appearing neutral. Remainder of visit focusing on education for proper posture and body mechanics for typical daily positioning, mobility and household tasks - pt acknowledging "there's a lot to know" but verbalized understanding..  OBJECTIVE IMPAIRMENTS: decreased activity tolerance, decreased endurance, decreased knowledge of condition, decreased mobility, difficulty walking, decreased ROM, decreased strength, hypomobility, increased fascial restrictions, impaired perceived functional ability,  increased muscle spasms, impaired flexibility, impaired sensation, improper body mechanics, postural dysfunction, and pain.   ACTIVITY LIMITATIONS: carrying, lifting, bending, sitting, standing, squatting, sleeping, stairs, transfers, bed mobility, bathing, toileting, dressing, and caring for others  PARTICIPATION LIMITATIONS: meal prep, cleaning, laundry, shopping, community activity, and school  PERSONAL FACTORS: Fitness, Past/current experiences, Time since onset of injury/illness/exacerbation, and 3+ comorbidities: Seizures, CVA, bipolar 1 disorder, PTSD  are also affecting patient's functional outcome.   REHAB POTENTIAL: Good  CLINICAL DECISION MAKING: Evolving/moderate complexity  EVALUATION COMPLEXITY: Moderate   GOALS: Goals reviewed with patient? Yes  SHORT TERM GOALS: Target date: 12/08/2021   Patient will be independent with initial HEP to improve outcomes and carryover.  Baseline: Initial HEP provided on eval Goal status: IN PROGRESS  2.  Patient will report centralization of radicular symptoms.  Baseline: Intermittent L LE radicular numbness to calf Goal status: IN PROGRESS  3.  Patient to verbalize understanding of good spinal alignment/posturing and body mechanics needed for daily activities. Baseline: Flexed trunk posture with forward head, rounded shoulders, increased positional kyphosis and lumbar lordosis with anterior pelvic tilt Goal status: IN PROGRESS   LONG TERM GOALS: Target date: 01/19/2022    Patient will be independent with ongoing/advanced HEP for self-management at home.  Baseline: Initial HEP provided on eval Goal status: IN PROGRESS  2.  Patient will report  75% improvement in low back pain to improve QOL.  Baseline: 3-4/10 Goal status: IN PROGRESS  3.  Patient to demonstrate ability to achieve and maintain good spinal alignment/posturing and body mechanics needed for daily activities. Baseline: Flexed trunk posture with forward head, rounded  shoulders, increased positional kyphosis and lumbar lordosis with anterior pelvic tilt Goal status: IN PROGRESS  4.  Patient will demonstrate full pain free lumbar ROM to perform ADLs.   Baseline: 25% limitation in lumbar extension, but pain and tightness noted with most motions Goal status: IN PROGRESS  5.  Patient will demonstrate improved B LE strength to >/= 4+/5 for improved stability and ease of mobility . Baseline: Refer to above MMT table Goal status: IN PROGRESS  6.  Patient will report </= 36% on Modified Oswestry to demonstrate improved functional ability.  Baseline:  24 / 50 = 48.0% Goal status: IN PROGRESS   7.  Patient will tolerate >30-45 min of sitting or standing to perform normal daily ADLs and school-related tasks. Baseline: Pain prevents her from sitting or standing for >30 min. Goal status: IN PROGRESS   PLAN: PT FREQUENCY: 2x/week  PT DURATION: 8 weeks  PLANNED INTERVENTIONS: Therapeutic exercises, Therapeutic activity, Neuromuscular re-education, Balance training, Gait training, Patient/Family education, Self Care, Joint mobilization, Stair training, Dry Needling, Spinal mobilization, Cryotherapy, Moist heat, Taping, Traction, Ultrasound, Ionotophoresis '4mg'$ /ml Dexamethasone, Manual therapy, and Re-evaluation.  PLAN FOR NEXT SESSION:  STG assessment and Medicaid re-authorization; progress lumbopelvic flexibility and stabilization/strengthening; review posture and body mechanics education PRN; MT +/- DN to address pain and abnormal muscle tension in thoracolumbar paraspinals and glutes/piriformis; modalities PRN for pain   Percival Spanish, PT 12/01/2021, 2:45 PM

## 2021-12-02 ENCOUNTER — Emergency Department (HOSPITAL_BASED_OUTPATIENT_CLINIC_OR_DEPARTMENT_OTHER): Payer: Medicaid Other

## 2021-12-02 ENCOUNTER — Other Ambulatory Visit: Payer: Self-pay

## 2021-12-02 ENCOUNTER — Emergency Department (HOSPITAL_BASED_OUTPATIENT_CLINIC_OR_DEPARTMENT_OTHER)
Admission: EM | Admit: 2021-12-02 | Discharge: 2021-12-02 | Disposition: A | Discharge status: Home / Self Care | Payer: Medicaid Other | Attending: Emergency Medicine | Admitting: Emergency Medicine

## 2021-12-02 ENCOUNTER — Encounter (HOSPITAL_BASED_OUTPATIENT_CLINIC_OR_DEPARTMENT_OTHER): Payer: Self-pay | Admitting: Emergency Medicine

## 2021-12-02 DIAGNOSIS — S93432A Sprain of tibiofibular ligament of left ankle, initial encounter: Secondary | ICD-10-CM | POA: Insufficient documentation

## 2021-12-02 DIAGNOSIS — X501XXA Overexertion from prolonged static or awkward postures, initial encounter: Secondary | ICD-10-CM | POA: Diagnosis not present

## 2021-12-02 DIAGNOSIS — Z79899 Other long term (current) drug therapy: Secondary | ICD-10-CM | POA: Diagnosis not present

## 2021-12-02 DIAGNOSIS — S93492A Sprain of other ligament of left ankle, initial encounter: Secondary | ICD-10-CM

## 2021-12-02 DIAGNOSIS — Y92002 Bathroom of unspecified non-institutional (private) residence single-family (private) house as the place of occurrence of the external cause: Secondary | ICD-10-CM | POA: Diagnosis not present

## 2021-12-02 DIAGNOSIS — S99912A Unspecified injury of left ankle, initial encounter: Secondary | ICD-10-CM | POA: Diagnosis present

## 2021-12-02 MED ORDER — IBUPROFEN 800 MG PO TABS
800.0000 mg | ORAL_TABLET | Freq: Once | ORAL | Status: AC
  Administered 2021-12-02: 800 mg via ORAL
  Filled 2021-12-02: qty 1

## 2021-12-02 MED ORDER — ACETAMINOPHEN 325 MG PO TABS
650.0000 mg | ORAL_TABLET | Freq: Once | ORAL | Status: AC
  Administered 2021-12-02: 650 mg via ORAL
  Filled 2021-12-02: qty 2

## 2021-12-02 NOTE — ED Notes (Signed)
Pt fell injuring her left foot and ankle

## 2021-12-02 NOTE — Discharge Instructions (Signed)
Please use Tylenol or ibuprofen for pain.  You may use 600 mg ibuprofen every 6 hours or 1000 mg of Tylenol every 6 hours.  You may choose to alternate between the 2.  This would be most effective.  Not to exceed 4 g of Tylenol within 24 hours.  Not to exceed 3200 mg ibuprofen 24 hours.  I recommend the RICE method which hands for rest, ice, compression, elevation of the affected extremity.  You can bear weight as tolerated but may have some increased pain for the first few days until you begin to see improvement with ankle.  Have attached some rehab exercises that will help with your sprain over the next few days.  If you still have significant pain despite 2 weeks of trying all of the above I recommend following up with orthopedics for further evaluation.

## 2021-12-02 NOTE — ED Triage Notes (Signed)
Patient states she went to the restroom and her legs fell asleep while she was sitting on the toilet, when she went to walk her left leg turned and caused her to fall. Patient c/o left foot and ankle pain.

## 2021-12-02 NOTE — ED Provider Notes (Signed)
[facility] Provider Note   CSN: [id number] Arrival date & time: 12/02/21  2120     History  Chief Complaint  Patient presents with   [facility] is a 41 y.o. female with noncontributory past medical history presents with concern for left Foot, ankle pain after fall.  Patient reports that she was sitting on the toilet when her legs fell asleep, and then she stood up, and twisted the left ankle.  Patient reports that she has been able to bear weight since with some pain.  She reports that the pain is sharp in nature, worsened with movement.  She not take anything for pain prior to arrival.  She denies any persistent numbness, tingling.  She does not take any blood thinners.  Patient denies any other notable injuries at this time.   Fall       Home Medications Prior to Admission medications   Medication Sig Start Date End Date Taking? Authorizing Provider  ARIPiprazole (ABILIFY) 10 MG tablet Take 10 mg by mouth daily. Patient not taking: Reported on 11/24/2021    [provider]  busPIRone (BUSPAR) 5 MG tablet TAKE 1 TABLET BY MOUTH THREE TIMES A DAY 06/06/18   [provider]  chlorhexidine (PERIDEX) 0.12 % solution Use as directed 15 mLs in the mouth or throat 2 (two) times daily. Patient not taking: Reported on 11/24/2021 05/07/19   Sherol Dade E, PA-C  diazepam (VALIUM) 5 MG tablet Take one tablet by mouth with food one hour prior to procedure. May repeat 30 minutes prior if needed. 11/12/21   Lorine Bears, NP  diphenhydrAMINE (BENADRYL) 25 MG tablet Take 1 tablet (25 mg total) by mouth every 6 (six) hours. 04/14/15   Fredia Sorrow, MD  gabapentin (NEURONTIN) 300 MG capsule Take 600 mg by mouth 3 (three) times daily.     [provider]  levETIRAcetam (KEPPRA) 1000 MG tablet Take 2,000 mg by mouth 2 (two) times daily.     [provider]  lidocaine (LIDODERM) 5 % Place 1 patch onto the  skin daily. Remove & Discard patch within 12 hours or as directed by MD Patient not taking: Reported on 11/24/2021 10/27/21   Mickie Hillier, PA-C  meloxicam (MOBIC) 15 MG tablet Take 1 tablet (15 mg total) by mouth daily. 10/31/21   Persons, Bevely Palmer, PA  methadone (DOLOPHINE) 10 MG tablet Take by mouth.    [provider]  methocarbamol (ROBAXIN) 500 MG tablet Take 1 tablet (500 mg total) by mouth 2 (two) times daily. 10/27/21   Mickie Hillier, PA-C  omeprazole (PRILOSEC) 40 MG capsule Take by mouth daily. 06/24/18   [provider]  permethrin (ELIMITE) 5 % cream Apply to entire body once and leave on for 8-12 hours before washing off 07/16/18   Alroy Bailiff, Margaux, PA-C  predniSONE (DELTASONE) 10 MG tablet Take 1 tablet (10 mg total) by mouth daily. 06/03/21   Horton, Alvin Critchley, DO  sertraline (ZOLOFT) 100 MG tablet Take 100 mg by mouth daily.    [provider]  tiZANidine (ZANAFLEX) 4 MG tablet Take 1 tablet (4 mg total) by mouth every 6 (six) hours as needed for muscle spasms. 11/03/21   Persons, Bevely Palmer, PA  traZODone (DESYREL) 100 MG tablet Take 100 mg by mouth at bedtime. Takes 4 tablets     '400mg'$     [provider]      Allergies    Sulfa  antibiotics, Aripiprazole, Duloxetine hcl, and Vicodin [hydrocodone-acetaminophen]    Review of Systems   Review of Systems  Musculoskeletal:  Positive for gait problem and joint swelling.  All other systems reviewed and are negative.   Physical Exam Updated Vital Signs BP 125/83 (BP Location: Left Arm)   Pulse 86   Temp 98.4 F (36.9 C)   Resp 18   Ht '5\' 6"'$  (1.676 m)   Wt 84.8 kg   SpO2 95%   BMI 30.18 kg/m  Physical Exam Vitals and nursing note reviewed.  Constitutional:      General: She is not in acute distress.    Appearance: Normal appearance.  HENT:     Head: Normocephalic and atraumatic.  Eyes:     General:        Right eye: No discharge.        Left eye: No discharge.  Cardiovascular:      Rate and Rhythm: Normal rate and regular rhythm.     Pulses: Normal pulses.  Pulmonary:     Effort: Pulmonary effort is normal. No respiratory distress.  Musculoskeletal:        General: No deformity.     Comments: Patient with some minor soft tissue swelling overlying the lateral midfoot, some tenderness to palpation midfoot, and lateral malleolus on the left.  Intact strength to dorsiflexion, plantarflexion at the ankle, normal range of motion both passively and actively at the ankle.  Patient can bear weight with some difficulty.  Skin:    General: Skin is warm and dry.     Capillary Refill: Capillary refill takes less than 2 seconds.  Neurological:     Mental Status: She is alert and oriented to person, place, and time.  Psychiatric:        Mood and Affect: Mood normal.        Behavior: Behavior normal.     ED Results / Procedures / Treatments   Labs (all labs ordered are listed, but only abnormal results are displayed) Labs Reviewed - No data to display  EKG None  Radiology DG Foot Complete Left  Result Date: 12/02/2021 CLINICAL DATA:  Left foot and ankle pain. Fall, turned leg while walking leading to fall. EXAM: LEFT FOOT - COMPLETE 3+ VIEW COMPARISON:  Foot radiograph 06/18/2016 FINDINGS: No acute fracture or dislocation. Previous fifth proximal phalanx fracture has healed. Chronic irregularity about the lateral calcaneus. Chronic sclerosis involving the third toe proximal phalanx and fourth toe middle phalanx. Well corticated density adjacent to the base of the fifth metatarsal is chronic. No erosive change or periostitis. No focal soft tissue abnormalities. IMPRESSION: No acute fracture or dislocation of the left foot. Electronically Signed   By: Keith Rake M.D.   On: 12/02/2021 22:11   DG Ankle Complete Left  Result Date: 12/02/2021 CLINICAL DATA:  Left foot and ankle pain after fall, turned leg while walking leading to fall. EXAM: LEFT ANKLE COMPLETE - 3+ VIEW  COMPARISON:  Radiograph 06/18/2016 FINDINGS: There is no evidence of fracture or dislocation. The ankle mortise is preserved. Well corticated density about the dorsal navicular may represent a remote injury or an accessory ossicle. Possible small ankle joint effusion. Soft tissues are unremarkable. IMPRESSION: No fracture or subluxation of the left ankle. Possible small joint effusion. Electronically Signed   By: Keith Rake M.D.   On: 12/02/2021 22:09    Procedures Procedures    Medications Ordered in ED Medications  ibuprofen (ADVIL) tablet 800 mg (800 mg Oral Given  12/02/21 2203)  acetaminophen (TYLENOL) tablet 650 mg (650 mg Oral Given 12/02/21 2203)    ED Course/ Medical Decision Making/ A&P                           Medical Decision Making Amount and/or Complexity of Data Reviewed Radiology: ordered.  Risk OTC drugs. Prescription drug management.   There is an overall well-appearing 41 year old female with overall noncontributory past medical history, does not take blood thinners who presents with concern for left foot and ankle pain after falling, patient reports that she went to the restroom, legs fell asleep while on the toilet, left leg turning caused her to fall.  On exam patient is neurovascularly intact, with some tenderness of the left foot and ankle on the lateral aspect.  I independently interpreted imaging including plain film of the left ankle and left foot which shows no acute fracture, dislocation, possible small ankle effusion, suggesting likely sprain. I agree with the radiologist interpretation.  O my exam patient is able to ambulate with some pain, she has no step-off, mild soft tissue deformity.  Encouraged rest, ice, compression, elevation, will offer ASO ankle brace.  Years orthopedic follow-up as needed.  Patient discharged in stable condition at this time. Final Clinical Impression(s) / ED Diagnoses Final diagnoses:  Sprain of anterior talofibular  ligament of left ankle, initial encounter    Rx / DC Orders ED Discharge Orders     None         Dorien Chihuahua 12/02/21 2223    Fransico Meadow, MD 12/03/21 1344

## 2021-12-03 ENCOUNTER — Ambulatory Visit: Payer: No Typology Code available for payment source

## 2021-12-08 ENCOUNTER — Ambulatory Visit: Payer: No Typology Code available for payment source

## 2021-12-08 NOTE — Procedures (Signed)
Lumbar Facet Joint Intra-Articular Injection(s) with Fluoroscopic Guidance  Patient: [facility]      Date of Birth: 1980/09/17 MRN: [id number] PCP: Robert Bellow, PA-C      Visit Date: 11/27/2021   Universal Protocol:    Date/Time: 11/27/2021  Consent Given By: the patient  Position: PRONE   Additional Comments: Vital signs were monitored before and after the procedure. Patient was prepped and draped in the usual sterile fashion. The correct patient, procedure, and site was verified.   Injection Procedure Details:  Procedure Site One Meds Administered:  Meds ordered this encounter  Medications   methylPREDNISolone acetate (DEPO-MEDROL) injection 80 mg     Laterality: Bilateral  Location/Site:  L4-L5 L5-S1  Needle size: 22 guage  Needle type: Spinal  Needle Placement: Articular  Findings:  -Comments: Excellent flow of contrast producing a partial arthrogram.  Procedure Details: The fluoroscope beam is vertically oriented in AP, and the inferior recess is visualized beneath the lower pole of the inferior apophyseal process, which represents the target point for needle insertion. When direct visualization is difficult the target point is located at the medial projection of the vertebral pedicle. The region overlying each aforementioned target is locally anesthetized with a 1 to 2 ml. volume of 1% Lidocaine without Epinephrine.   The spinal needle was inserted into each of the above mentioned facet joints using biplanar fluoroscopic guidance. A 0.25 to 0.5 ml. volume of Isovue-250 was injected and a partial facet joint arthrogram was obtained. A single spot film was obtained of the resulting arthrogram.    One to 1.25 ml of the steroid/anesthetic solution was then injected into each of the facet joints noted above.   Additional Comments:  The patient tolerated the procedure well Dressing: 2 x 2 sterile gauze and Band-Aid    Post-procedure details: Patient was  observed during the procedure. Post-procedure instructions were reviewed.  Patient left the clinic in stable condition.

## 2021-12-08 NOTE — Progress Notes (Signed)
[facility] - 41 y.o. female MRN [id number]  Date of birth: 1980-05-08  Office Visit Note: Visit Date: 11/27/2021 PCP: Robert Bellow, PA-C Referred by: Robert Bellow, PA-C  Subjective: Chief Complaint  Patient presents with   Lower Back - Pain   HPI:  [facility] is a 41 y.o. female who comes in today at the request of Barnet Pall, FNP for planned Bilateral  L4-5 and L5-S1 Lumbar facet/medial branch block with fluoroscopic guidance.  The patient has failed conservative care including home exercise, medications, time and activity modification.  This injection will be diagnostic and hopefully therapeutic.  Please see requesting physician notes for further details and justification.  Exam has shown concordant pain with facet joint loading.   ROS Otherwise per HPI.  Assessment & Plan: Visit Diagnoses:    ICD-10-CM   1. Spondylosis without myelopathy or radiculopathy, lumbar region  M47.816 XR C-ARM NO REPORT    Facet Injection    methylPREDNISolone acetate (DEPO-MEDROL) injection 80 mg      Plan: No additional findings.   Meds & Orders:  Meds ordered this encounter  Medications   methylPREDNISolone acetate (DEPO-MEDROL) injection 80 mg    Orders Placed This Encounter  Procedures   Facet Injection   XR C-ARM NO REPORT    Follow-up: Return for Review Pain Diary.   Procedures: No procedures performed  Lumbar Facet Joint Intra-Articular Injection(s) with Fluoroscopic Guidance  Patient: [facility]      Date of Birth: September 22, 1980 MRN: [id number] PCP: Robert Bellow, PA-C      Visit Date: 11/27/2021   Universal Protocol:    Date/Time: 11/27/2021  Consent Given By: the patient  Position: PRONE   Additional Comments: Vital signs were monitored before and after the procedure. Patient was prepped and draped in the usual sterile fashion. The correct patient, procedure, and site was verified.   Injection Procedure Details:  Procedure Site One Meds  Administered:  Meds ordered this encounter  Medications   methylPREDNISolone acetate (DEPO-MEDROL) injection 80 mg     Laterality: Bilateral  Location/Site:  L4-L5 L5-S1  Needle size: 22 guage  Needle type: Spinal  Needle Placement: Articular  Findings:  -Comments: Excellent flow of contrast producing a partial arthrogram.  Procedure Details: The fluoroscope beam is vertically oriented in AP, and the inferior recess is visualized beneath the lower pole of the inferior apophyseal process, which represents the target point for needle insertion. When direct visualization is difficult the target point is located at the medial projection of the vertebral pedicle. The region overlying each aforementioned target is locally anesthetized with a 1 to 2 ml. volume of 1% Lidocaine without Epinephrine.   The spinal needle was inserted into each of the above mentioned facet joints using biplanar fluoroscopic guidance. A 0.25 to 0.5 ml. volume of Isovue-250 was injected and a partial facet joint arthrogram was obtained. A single spot film was obtained of the resulting arthrogram.    One to 1.25 ml of the steroid/anesthetic solution was then injected into each of the facet joints noted above.   Additional Comments:  The patient tolerated the procedure well Dressing: 2 x 2 sterile gauze and Band-Aid    Post-procedure details: Patient was observed during the procedure. Post-procedure instructions were reviewed.  Patient left the clinic in stable condition.    Clinical History: No specialty comments available.     Objective:  VS:  HT:    WT:   BMI:  BP:116/76  HR:94bpm  TEMP: ( )  RESP:  Physical Exam Vitals and nursing note reviewed.  Constitutional:      General: She is not in acute distress.    Appearance: Normal appearance. She is not ill-appearing.  HENT:     Head: Normocephalic and atraumatic.     Right Ear: External ear normal.     Left Ear: External ear normal.   Eyes:     Extraocular Movements: Extraocular movements intact.  Cardiovascular:     Rate and Rhythm: Normal rate.     Pulses: Normal pulses.  Pulmonary:     Effort: Pulmonary effort is normal. No respiratory distress.  Abdominal:     General: There is no distension.     Palpations: Abdomen is soft.  Musculoskeletal:        General: Tenderness present.     Cervical back: Neck supple.     Right lower leg: No edema.     Left lower leg: No edema.     Comments: Patient has good distal strength with no pain over the greater trochanters.  No clonus or focal weakness.  Skin:    Findings: No erythema, lesion or rash.  Neurological:     General: No focal deficit present.     Mental Status: She is alert and oriented to person, place, and time.     Sensory: No sensory deficit.     Motor: No weakness or abnormal muscle tone.     Coordination: Coordination normal.  Psychiatric:        Mood and Affect: Mood normal.        Behavior: Behavior normal.      Imaging: No results found.

## 2021-12-10 ENCOUNTER — Ambulatory Visit: Payer: No Typology Code available for payment source

## 2021-12-10 DIAGNOSIS — R262 Difficulty in walking, not elsewhere classified: Secondary | ICD-10-CM

## 2021-12-10 DIAGNOSIS — M5416 Radiculopathy, lumbar region: Secondary | ICD-10-CM

## 2021-12-10 DIAGNOSIS — M62830 Muscle spasm of back: Secondary | ICD-10-CM

## 2021-12-10 DIAGNOSIS — M5459 Other low back pain: Secondary | ICD-10-CM | POA: Diagnosis not present

## 2021-12-10 DIAGNOSIS — M6281 Muscle weakness (generalized): Secondary | ICD-10-CM

## 2021-12-10 NOTE — Therapy (Signed)
OUTPATIENT PHYSICAL THERAPY TREATMENT   Patient Name: Heather Barton MRN: [id number] DOB:1980-09-23, 41 y.o., female Today's Date: 12/10/2021   PT End of Session - 12/10/21 1452     Visit Number 4    Number of Visits 16    Date for PT Re-Evaluation 01/19/22    Authorization Type Medicaid    Authorization Time Period 12/08/21- 01/18/22    Authorization - Visit Number 1    Authorization - Number of Visits 12    PT Start Time 2703    PT Stop Time 1439    PT Time Calculation (min) 36 min    Activity Tolerance Patient tolerated treatment well    Behavior During Therapy WFL for tasks assessed/performed                Past Medical History:  Diagnosis Date   Bipolar 1 disorder ([place])    Brain tumor (benign) ([place])    Personality disorder ([place])    PTSD (post-traumatic stress disorder)    Seizures ([place])    last seizure 2 weeks ago   Stroke [facility])    Substance abuse ([place])    Past Surgical History:  Procedure Laterality Date   LEG SURGERY     TUBAL LIGATION     TUMOR REMOVAL     WRIST SURGERY     Patient Active Problem List   Diagnosis Date Noted   Low back pain 10/31/2021    PCP: Rip Harbour   REFERRING PROVIDER: Persons, Bevely Palmer, PA   REFERRING DIAG: M54.40 (ICD-10-CM) - Acute bilateral low back pain with sciatica, sciatica laterality unspecified  THERAPY DIAG:  Other low back pain  Radiculopathy, lumbar region  Muscle weakness (generalized)  Muscle spasm of back  Difficulty in walking, not elsewhere classified  RATIONALE FOR EVALUATION AND TREATMENT: Rehabilitation  ONSET DATE: Chronic  NEXT MD VISIT: PRN pending response to [facility] (11/27/21) and PT   SUBJECTIVE:                                                                                                                                                                                           SUBJECTIVE STATEMENT: Pt reports last week that she had a bad ankle sprain, where she was  sitting on the toilet and fell asleep, when she woke her foot was turn the wrong way. She has not been able to do exercises since.  PAIN:  Are you having pain? No- none in low back, yes in ankle  PERTINENT HISTORY: Seizures, CVA, bipolar 1 disorder, PTSD  PRECAUTIONS: None  WEIGHT BEARING RESTRICTIONS: No  FALLS:  Has patient fallen in  last 6 months? Yes. 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   Minimal degenerative changes of the lower lumbar spine, most prominent at L5-S1 with severe left-sided facet arthropathy and mild left-sided neural foraminal stenosis at this level. Unchanged mild spinal canal narrowing at L4-L5.  PATIENT SURVEYS:  Modified Oswestry 24 / 50 = 48.0% - severe disability   SCREENING FOR RED FLAGS: Bowel or bladder incontinence: Yes: when feeling nauseous Spinal tumors: No Cauda equina syndrome: No Compression fracture: No Abdominal aneurysm: No  COGNITION:  Overall cognitive status: Within functional limits for tasks assessed     SENSATION: WFL Except intermittent numbness down L LE to mid calf  MUSCLE LENGTH: Hamstrings: very mild tight B ITB: mild tight B Piriformis: mild tight R>L Hip flexors: mild/mod tight B Quads: mild tight B Heelcord: WFL  POSTURE:  rounded shoulders, forward head, increased lumbar lordosis, increased  thoracic kyphosis, and anterior pelvic tilt  PALPATION: Increased muscle tension and TTP in B thoracolumbar paraspinals, glutes and piriformis. Lumbar and sacral hypomobility with CPAs.  LUMBAR ROM:   Active  AROM  Eval - 11/24/21  Flexion Fingertips to floor - tight  Extension 25% limited - pain  Right lateral flexion WFL - pain in L low back  Left lateral flexion WFL - tight in R low back  Right rotation WFL  Left rotation WFL   (Blank rows = not tested)  LOWER EXTREMITY ROM:    WFL  LOWER EXTREMITY MMT:    MMT Right eval Left eval  Hip flexion 4+ 4  Hip extension 4 4  Hip abduction 4 4  Hip adduction 4+ 4  Hip internal rotation 4+ 4  Hip external rotation 4 4-  Knee flexion 4 4  Knee extension 4+ 4-  Ankle dorsiflexion 4 4  Ankle plantarflexion 5- 4  Ankle inversion    Ankle eversion     (Blank rows = not tested)  LUMBAR SPECIAL TESTS:  Straight leg raise test: Negative and FABER test: Negative   TODAY'S TREATMENT: 12/10/21 THERAPEUTIC EXERCISE: to improve flexibility, strength and mobility.  Verbal and tactile cues throughout for technique. Rec Bike - L3 x 8 min Seated lumbar flexion stretch green yoga ball 2x30" Seated rhomboid stretch 2x30"  Manual Therapy: STM to R thoracic paraspinals, rhomboids  12/01/21 THERAPEUTIC EXERCISE: to improve flexibility, strength and mobility.  Verbal and tactile cues throughout for technique. Rec Bike - L3 x 6 min Seated 3-way lumbar flexion stretch with green Pball 2 x 30" each position  Hooklying TrA + RTB hip flexion march 10 x 3" Hooklying TrA + RTB hip ABD/ER clam 10 x 3" Bridge + RTB hip ABD isometric 10 x 3" - limited lift (L lower than R) R/L sidelying RTB clam 10 x 3"  SELF CARE:  Provided education in proper posture and body mechanics for typical daily  positioning and household chores to minimize strain on low back and neck.   11/26/21 TherEx: Nustep L4x59mn SKTC stretch 2x30" bil Piriformis stretch  2x30" bil LTR supine 2x 15 sec hold bil Pelvic tilts in hooklying x 10 Seated lumbar flexion stretch 2x30 sec green pball  L thomas stretch x 30 sec Supine bridge x 10 Supine clam red TB x 10   PATIENT EDUCATION:  Education details: PT eval findings, anticipated POC, and initial HEP Person educated: Patient Education method: Explanation, Demonstration, Verbal cues, and Handouts Education comprehension: verbalized understanding, returned demonstration, verbal cues required, and needs further education   HOME EXERCISE PROGRAM: Access Code: [email or web address] URL: [email or web address] Date: 12/01/2021 Prepared by: JAnnie Paras Exercises - Supine Single Knee to Chest  - 2-3 x daily - 7 x weekly - 3 reps - 30 sec hold - Supine Piriformis Stretch Pulling Heel to Hip  - 2-3 x daily - 7 x weekly - 3 reps - 30 sec hold - Supine Lower Trunk Rotation  - 2-3 x daily - 7 x weekly - 5 reps - 10-15 sec hold - Supine Posterior Pelvic Tilt  - 2-3 x daily - 7 x weekly - 2 sets - 10 reps - 5 sec hold - Prone Press Up  - 2-3 x daily - 7 x weekly - 2 sets - 10 reps - 2 sec hold - Hip Flexor Stretch at Edge of Bed  - 1 x daily - 7 x weekly - 3 sets - 30 sec hold - Supine Bridge  - 1 x daily - 7 x weekly - 2 sets - 10 reps - Hooklying Clamshell with Resistance  - 1 x daily - 7 x weekly - 2 sets - 10 reps  Patient Education - Posture and Body Mechanics  ASSESSMENT:  CLINICAL IMPRESSION: TSatonyahas recently had an ankle injury which is why she has missed the past two appointment of PT. Today we checked STGs, she met STG #1. STGs #2 and #3 are in progress, with specifics in the goal section of note. We tried to work on lumbopelvic exercises but pt had a spasm along thoracic portion of the back, she had to stop and stretch. We then did some STM which she was very tender in the thoracic paraspinals and rhomboids area. She noted that these spasms have been going on for the past few days and feels that  they came on from her injury last week in the bathroom. Advised to seek MD for muscle spasm in thoracic muscles if sx persist. Ended session early d/t muscle spasms experienced from patient.   OBJECTIVE IMPAIRMENTS: decreased activity tolerance, decreased endurance, decreased knowledge of condition, decreased mobility, difficulty walking, decreased ROM, decreased strength, hypomobility, increased fascial restrictions, impaired perceived functional ability, increased muscle spasms, impaired flexibility, impaired sensation, improper body mechanics, postural dysfunction, and pain.   ACTIVITY LIMITATIONS: carrying, lifting, bending, sitting, standing, squatting, sleeping, stairs, transfers, bed mobility, bathing, toileting, dressing, and caring for others  PARTICIPATION LIMITATIONS: meal prep, cleaning, laundry, shopping, community activity, and school  PERSONAL FACTORS: Fitness, Past/current experiences, Time since onset of injury/illness/exacerbation, and 3+ comorbidities: Seizures, CVA, bipolar 1 disorder, PTSD  are also affecting patient's functional outcome.   REHAB POTENTIAL: Good  CLINICAL DECISION MAKING: Evolving/moderate complexity  EVALUATION COMPLEXITY: Moderate   GOALS: Goals reviewed with patient? Yes  SHORT TERM GOALS: Target date: 12/08/2021   Patient will be independent with initial HEP to improve outcomes and carryover.  Baseline: Initial HEP provided on eval  Goal status: MET - 12/10/21  2.  Patient will report centralization of radicular symptoms.  Baseline: Intermittent L LE radicular numbness to calf Goal status: IN PROGRESS- 12/10/21 the whole L leg goes numb at least everyday or every other day  3.  Patient to verbalize understanding of good spinal alignment/posturing and body mechanics needed for daily activities. Baseline: Flexed trunk posture with forward head, rounded shoulders, increased positional kyphosis and lumbar lordosis with anterior pelvic tilt Goal  status: IN PROGRESS- education given on 12/10/21   LONG TERM GOALS: Target date: 01/19/2022    Patient will be independent with ongoing/advanced HEP for self-management at home.  Baseline: Initial HEP provided on eval Goal status: IN PROGRESS  2.  Patient will report 75% improvement in low back pain to improve QOL.  Baseline: 3-4/10 Goal status: IN PROGRESS  3.  Patient to demonstrate ability to achieve and maintain good spinal alignment/posturing and body mechanics needed for daily activities. Baseline: Flexed trunk posture with forward head, rounded shoulders, increased positional kyphosis and lumbar lordosis with anterior pelvic tilt Goal status: IN PROGRESS  4.  Patient will demonstrate full pain free lumbar ROM to perform ADLs.   Baseline: 25% limitation in lumbar extension, but pain and tightness noted with most motions Goal status: IN PROGRESS  5.  Patient will demonstrate improved B LE strength to >/= 4+/5 for improved stability and ease of mobility . Baseline: Refer to above MMT table Goal status: IN PROGRESS  6.  Patient will report </= 36% on Modified Oswestry to demonstrate improved functional ability.  Baseline:  24 / 50 = 48.0% Goal status: IN PROGRESS   7.  Patient will tolerate >30-45 min of sitting or standing to perform normal daily ADLs and school-related tasks. Baseline: Pain prevents her from sitting or standing for >30 min. Goal status: IN PROGRESS   PLAN: PT FREQUENCY: 2x/week  PT DURATION: 8 weeks  PLANNED INTERVENTIONS: Therapeutic exercises, Therapeutic activity, Neuromuscular re-education, Balance training, Gait training, Patient/Family education, Self Care, Joint mobilization, Stair training, Dry Needling, Spinal mobilization, Cryotherapy, Moist heat, Taping, Traction, Ultrasound, Ionotophoresis 39m/ml Dexamethasone, Manual therapy, and Re-evaluation.  PLAN FOR NEXT SESSION:  progress lumbopelvic flexibility and stabilization/strengthening; review  posture and body mechanics education PRN; MT +/- DN to address pain and abnormal muscle tension in thoracolumbar paraspinals and glutes/piriformis; modalities PRN for pain   BArtist Pais PTA 12/10/2021, 2:53 PM

## 2021-12-11 ENCOUNTER — Encounter: Payer: Medicaid Other | Admitting: Physical Therapy

## 2021-12-15 ENCOUNTER — Ambulatory Visit: Payer: No Typology Code available for payment source | Admitting: Physical Therapy

## 2021-12-15 ENCOUNTER — Encounter: Payer: Self-pay | Admitting: Physical Therapy

## 2021-12-15 DIAGNOSIS — M5459 Other low back pain: Secondary | ICD-10-CM | POA: Diagnosis not present

## 2021-12-15 DIAGNOSIS — R262 Difficulty in walking, not elsewhere classified: Secondary | ICD-10-CM

## 2021-12-15 DIAGNOSIS — M5416 Radiculopathy, lumbar region: Secondary | ICD-10-CM

## 2021-12-15 DIAGNOSIS — M6281 Muscle weakness (generalized): Secondary | ICD-10-CM

## 2021-12-15 DIAGNOSIS — M62830 Muscle spasm of back: Secondary | ICD-10-CM

## 2021-12-15 NOTE — Therapy (Addendum)
OUTPATIENT PHYSICAL THERAPY TREATMENT / Central SUMMARY   Patient Name: Heather Barton MRN: [id number] DOB:1980-12-21, 41 y.o., female Today's Date: 12/15/2021   PT End of Session - 12/15/21 1447     Visit Number 5    Number of Visits 16    Date for PT Re-Evaluation 01/19/22    Authorization Type Medicaid    Authorization Time Period 12/08/21- 01/18/22    Authorization - Visit Number 2    Authorization - Number of Visits 12    PT Start Time 4259    PT Stop Time 1529    PT Time Calculation (min) 42 min    Activity Tolerance Patient tolerated treatment well    Behavior During Therapy WFL for tasks assessed/performed                Past Medical History:  Diagnosis Date   Bipolar 1 disorder ([place])    Brain tumor (benign) ([place])    Personality disorder ([place])    PTSD (post-traumatic stress disorder)    Seizures ([place])    last seizure 2 weeks ago   Stroke [facility])    Substance abuse ([place])    Past Surgical History:  Procedure Laterality Date   LEG SURGERY     TUBAL LIGATION     TUMOR REMOVAL     WRIST SURGERY     Patient Active Problem List   Diagnosis Date Noted   Low back pain 10/31/2021    PCP: Robert Bellow, PA-C   REFERRING PROVIDER: Persons, Bevely Palmer, PA   REFERRING DIAG: M54.40 (ICD-10-CM) - Acute bilateral low back pain with sciatica, sciatica laterality unspecified  THERAPY DIAG:  Other low back pain  Radiculopathy, lumbar region  Muscle weakness (generalized)  Muscle spasm of back  Difficulty in walking, not elsewhere classified  RATIONALE FOR EVALUATION AND TREATMENT: Rehabilitation  ONSET DATE: Chronic  NEXT MD VISIT: PRN pending response to [facility] (11/27/21) and PT   SUBJECTIVE:                                                                                                                                                                                           SUBJECTIVE STATEMENT: Pt reports she continues to have spasms in her lower  ribs/thoracic since her fall when she sprained her ankle. She also notes her lower back has been popping more than usual.  PAIN:  Are you having pain? Yes: NPRS scale: 2/10 Pain location: R lower thoracic/ribs Pain description: stabbing, twisting, spasm  PERTINENT HISTORY: Seizures, CVA, bipolar 1 disorder, PTSD  PRECAUTIONS: None  WEIGHT BEARING RESTRICTIONS: No  FALLS:  Has patient fallen in  last 6 months? Yes. 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   Minimal degenerative changes of the lower lumbar spine, most prominent at L5-S1 with severe left-sided facet arthropathy and mild left-sided neural foraminal stenosis at this level. Unchanged mild spinal canal narrowing at L4-L5.  PATIENT SURVEYS:  Modified Oswestry 24 / 50 = 48.0% - severe disability   SCREENING FOR RED FLAGS: Bowel or bladder incontinence: Yes: when feeling nauseous Spinal tumors: No Cauda equina syndrome: No Compression fracture: No Abdominal aneurysm: No  COGNITION:  Overall cognitive status: Within functional limits for tasks assessed     SENSATION: WFL Except intermittent numbness down L LE to mid calf  MUSCLE LENGTH: Hamstrings: very mild tight B ITB: mild tight B Piriformis: mild tight R>L Hip flexors: mild/mod tight B Quads: mild tight B Heelcord: WFL  POSTURE:  rounded  shoulders, forward head, increased lumbar lordosis, increased thoracic kyphosis, and anterior pelvic tilt  PALPATION: Increased muscle tension and TTP in B thoracolumbar paraspinals, glutes and piriformis. Lumbar and sacral hypomobility with CPAs.  LUMBAR ROM:   Active  AROM  Eval - 11/24/21  Flexion Fingertips to floor - tight  Extension 25% limited - pain  Right lateral flexion WFL - pain in L low back  Left lateral flexion WFL - tight in R low back  Right rotation WFL  Left rotation WFL   (Blank rows = not tested)  LOWER EXTREMITY ROM:    WFL  LOWER EXTREMITY MMT:    MMT Right eval Left eval  Hip flexion 4+ 4  Hip extension 4 4  Hip abduction 4 4  Hip adduction 4+ 4  Hip internal rotation 4+ 4  Hip external rotation 4 4-  Knee flexion 4 4  Knee extension 4+ 4-  Ankle dorsiflexion 4 4  Ankle plantarflexion 5- 4  Ankle inversion    Ankle eversion     (Blank rows = not tested)  LUMBAR SPECIAL TESTS:  Straight leg raise test: Negative and FABER test: Negative   TODAY'S TREATMENT:  12/15/21 THERAPEUTIC EXERCISE: to improve flexibility, strength and mobility.  Verbal and tactile cues throughout for technique. NuStep - L5 x 6 min Seated rhomboid stretch 2 x 30" Seated R/L posterior shoulder capsule stretch 2 x 30" each Thoracic extension + pec stretch over back of chair 10 x 5" Cervical retraction 10 x 5" Seated RTB scap retraction + B shoulder ER 10 x 3" Seated RTB scap retraction + B shoulder horiz ABD 10 x 3" Seated RTB scap retraction + B shoulder horiz ABD alt diagonals 10 x 3"  MANUAL THERAPY: To promote normalized muscle tension, improved flexibility, and reduced pain. Skilled palpation and monitoring of soft tissue during DN Trigger Point Dry-Needling  Treatment instructions: Expect mild to moderate muscle soreness. S/S of pneumothorax if dry needled over a lung field, and to seek immediate  medical attention should they occur. Patient verbalized  understanding of these instructions and education. Patient Consent Given: Yes Education handout provided: Previously provided Muscles treated: R rhomboids, subscapularis and lats Electrical stimulation performed: No Parameters: N/A Treatment response/outcome: Twitch Response Elicited and Palpable Increase in Muscle Length STM/DTM, manual TPR and pin & stretch to muscles addressed with DN  SELF CARE: Provided instruction in self-STM techniques to medial scapular muscles using tennis ball on wall.   12/10/21 THERAPEUTIC EXERCISE: to improve flexibility, strength and mobility.  Verbal and tactile cues throughout for technique. Rec Bike - L3 x 8 min Seated lumbar flexion stretch green yoga ball 2x30" Seated rhomboid stretch 2x30"  Manual Therapy: STM to R thoracic paraspinals, rhomboids   12/01/21 THERAPEUTIC EXERCISE: to improve flexibility, strength and mobility.  Verbal and tactile cues throughout for technique. Rec Bike - L3 x 6 min Seated 3-way lumbar flexion stretch with green Pball 2 x 30" each position  Hooklying TrA + RTB hip flexion march 10 x 3" Hooklying TrA + RTB hip ABD/ER clam 10 x 3" Bridge + RTB hip ABD isometric 10 x 3" - limited lift (L lower than R) R/L sidelying RTB clam 10 x 3"  SELF CARE:  Provided education in proper posture and body mechanics for typical daily positioning and household chores to minimize strain on low back and neck.   PATIENT EDUCATION:  Education details: self-STM techniques to medial scapular muscles using tennis ball in sock on wall, role of DN, and DN rational, procedure, outcomes, potential side effects, and recommended post-treatment exercises/activity Person educated: Patient Education method: Explanation and Demonstration Education comprehension: verbalized understanding   HOME EXERCISE PROGRAM: Access Code: [email or web address] URL: [email or web address] Date: 12/01/2021 Prepared by: Annie Paras  Exercises - Supine  Single Knee to Chest  - 2-3 x daily - 7 x weekly - 3 reps - 30 sec hold - Supine Piriformis Stretch Pulling Heel to Hip  - 2-3 x daily - 7 x weekly - 3 reps - 30 sec hold - Supine Lower Trunk Rotation  - 2-3 x daily - 7 x weekly - 5 reps - 10-15 sec hold - Supine Posterior Pelvic Tilt  - 2-3 x daily - 7 x weekly - 2 sets - 10 reps - 5 sec hold - Prone Press Up  - 2-3 x daily - 7 x weekly - 2 sets - 10 reps - 2 sec hold - Hip Flexor Stretch at Edge of Bed  - 1 x daily - 7 x weekly - 3 sets - 30 sec hold - Supine Bridge  - 1 x daily - 7 x weekly - 2 sets - 10 reps - Hooklying Clamshell with Resistance  - 1 x daily - 7 x weekly - 2 sets - 10 reps  Patient Education - Posture and Body Mechanics  ASSESSMENT:  CLINICAL IMPRESSION: Marigene reports persistent pain and muscle spasm in R rhomboids and medial scapular muscles which did not seem to respond to MT last visit and she requested trial of DN. After explanation of DN rational, procedures, outcomes and potential side effects, including precautions with DN over the lung fields, patient verbalized consent to DN treatment in conjunction with manual STM/DTM and TPR to reduce ttp/muscle tension. Muscles treated as indicated above. DN produced normal response with good twitches elicited resulting in palpable reduction in pain/ttp and muscle tension. Pt educated to expect mild to moderate muscle soreness for up to 24-48 hrs and instructed to continue prescribed home exercise program and current  activity level with pt verbalizing understanding of theses instructions.   OBJECTIVE IMPAIRMENTS: decreased activity tolerance, decreased endurance, decreased knowledge of condition, decreased mobility, difficulty walking, decreased ROM, decreased strength, hypomobility, increased fascial restrictions, impaired perceived functional ability, increased muscle spasms, impaired flexibility, impaired sensation, improper body mechanics, postural dysfunction, and pain.    ACTIVITY LIMITATIONS: carrying, lifting, bending, sitting, standing, squatting, sleeping, stairs, transfers, bed mobility, bathing, toileting, dressing, and caring for others  PARTICIPATION LIMITATIONS: meal prep, cleaning, laundry, shopping, community activity, and school  PERSONAL FACTORS: Fitness, Past/current experiences, Time since onset of injury/illness/exacerbation, and 3+ comorbidities: Seizures, CVA, bipolar 1 disorder, PTSD  are also affecting patient's functional outcome.   REHAB POTENTIAL: Good  CLINICAL DECISION MAKING: Evolving/moderate complexity  EVALUATION COMPLEXITY: Moderate   GOALS: Goals reviewed with patient? Yes  SHORT TERM GOALS: Target date: 12/08/2021   Patient will be independent with initial HEP to improve outcomes and carryover.  Baseline: Initial HEP provided on eval Goal status: MET - 12/10/21  2.  Patient will report centralization of radicular symptoms.  Baseline: Intermittent L LE radicular numbness to calf Goal status: IN PROGRESS- 12/10/21 the whole L leg goes numb at least everyday or every other day  3.  Patient to verbalize understanding of good spinal alignment/posturing and body mechanics needed for daily activities. Baseline: Flexed trunk posture with forward head, rounded shoulders, increased positional kyphosis and lumbar lordosis with anterior pelvic tilt Goal status: IN PROGRESS- education given on 12/10/21   LONG TERM GOALS: Target date: 01/19/2022    Patient will be independent with ongoing/advanced HEP for self-management at home.  Baseline: Initial HEP provided on eval Goal status: IN PROGRESS  2.  Patient will report 75% improvement in low back pain to improve QOL.  Baseline: 3-4/10 Goal status: IN PROGRESS  3.  Patient to demonstrate ability to achieve and maintain good spinal alignment/posturing and body mechanics needed for daily activities. Baseline: Flexed trunk posture with forward head, rounded shoulders,  increased positional kyphosis and lumbar lordosis with anterior pelvic tilt Goal status: IN PROGRESS  4.  Patient will demonstrate full pain free lumbar ROM to perform ADLs.   Baseline: 25% limitation in lumbar extension, but pain and tightness noted with most motions Goal status: IN PROGRESS  5.  Patient will demonstrate improved B LE strength to >/= 4+/5 for improved stability and ease of mobility . Baseline: Refer to above MMT table Goal status: IN PROGRESS  6.  Patient will report </= 36% on Modified Oswestry to demonstrate improved functional ability.  Baseline:  24 / 50 = 48.0% Goal status: IN PROGRESS   7.  Patient will tolerate >30-45 min of sitting or standing to perform normal daily ADLs and school-related tasks. Baseline: Pain prevents her from sitting or standing for >30 min. Goal status: IN PROGRESS   PLAN: PT FREQUENCY: 2x/week  PT DURATION: 8 weeks  PLANNED INTERVENTIONS: Therapeutic exercises, Therapeutic activity, Neuromuscular re-education, Balance training, Gait training, Patient/Family education, Self Care, Joint mobilization, Stair training, Dry Needling, Spinal mobilization, Cryotherapy, Moist heat, Taping, Traction, Ultrasound, Ionotophoresis 4mg /ml Dexamethasone, Manual therapy, and Re-evaluation.  PLAN FOR NEXT SESSION:  assess response to DN; progress lumbopelvic and postural flexibility and stabilization/strengthening; review posture and body mechanics education PRN; MT +/- DN to address pain and abnormal muscle tension in thoracolumbar paraspinals and glutes/piriformis; modalities PRN for pain   Percival Spanish, PT 12/15/2021, 3:30 PM   PHYSICAL THERAPY DISCHARGE SUMMARY  Visits from Start of Care: 5  Current functional level related  to goals / functional outcomes:   Refer to above clinical impression and goal assessment for status as of last visit on 12/15/21.  Patient failed to schedule any further visits and when contacted about scheduling, she  declined to schedule due to having concerns about problems with her blood pressure.  She has not returned to physical therapy in 30 days, therefore will proceed with discharge from PT for this episode.     Remaining deficits:   As above. Unable to formally assess status at discharge due to failure to return to PT.   Education / Equipment:   HEP, Biomedical scientist education   Patient agrees to discharge. Patient goals were partially met. Patient is being discharged due to not returning since the last visit.  Percival Spanish, PT, MPT 01/21/22, 10:05 AM  [facility] [address]  [place], [place], [address] Phone: [phone]   Fax:  [phone]

## 2021-12-22 ENCOUNTER — Encounter (HOSPITAL_BASED_OUTPATIENT_CLINIC_OR_DEPARTMENT_OTHER): Payer: Self-pay | Admitting: Emergency Medicine

## 2021-12-22 ENCOUNTER — Other Ambulatory Visit: Payer: Self-pay

## 2021-12-22 ENCOUNTER — Emergency Department (HOSPITAL_BASED_OUTPATIENT_CLINIC_OR_DEPARTMENT_OTHER)
Admission: EM | Admit: 2021-12-22 | Discharge: 2021-12-22 | Disposition: A | Discharge status: Home / Self Care | Payer: No Typology Code available for payment source | Attending: Emergency Medicine | Admitting: Emergency Medicine

## 2021-12-22 ENCOUNTER — Emergency Department (HOSPITAL_BASED_OUTPATIENT_CLINIC_OR_DEPARTMENT_OTHER): Payer: No Typology Code available for payment source

## 2021-12-22 DIAGNOSIS — R112 Nausea with vomiting, unspecified: Secondary | ICD-10-CM | POA: Diagnosis not present

## 2021-12-22 DIAGNOSIS — D649 Anemia, unspecified: Secondary | ICD-10-CM | POA: Diagnosis not present

## 2021-12-22 DIAGNOSIS — Z79899 Other long term (current) drug therapy: Secondary | ICD-10-CM | POA: Diagnosis not present

## 2021-12-22 DIAGNOSIS — R519 Headache, unspecified: Secondary | ICD-10-CM

## 2021-12-22 DIAGNOSIS — I10 Essential (primary) hypertension: Secondary | ICD-10-CM | POA: Diagnosis not present

## 2021-12-22 DIAGNOSIS — R030 Elevated blood-pressure reading, without diagnosis of hypertension: Secondary | ICD-10-CM | POA: Diagnosis present

## 2021-12-22 DIAGNOSIS — R072 Precordial pain: Secondary | ICD-10-CM | POA: Insufficient documentation

## 2021-12-22 LAB — URINALYSIS, ROUTINE W REFLEX MICROSCOPIC
Bilirubin Urine: NEGATIVE
Glucose, UA: NEGATIVE mg/dL
Hgb urine dipstick: NEGATIVE
Ketones, ur: NEGATIVE mg/dL
Leukocytes,Ua: NEGATIVE
Nitrite: NEGATIVE
Protein, ur: NEGATIVE mg/dL
Specific Gravity, Urine: 1.02 (ref 1.005–1.030)
pH: 5.5 (ref 5.0–8.0)

## 2021-12-22 LAB — CBC
HCT: 31.3 % — ABNORMAL LOW (ref 36.0–46.0)
Hemoglobin: 9.5 g/dL — ABNORMAL LOW (ref 12.0–15.0)
MCH: 22.5 pg — ABNORMAL LOW (ref 26.0–34.0)
MCHC: 30.4 g/dL (ref 30.0–36.0)
MCV: 74.2 fL — ABNORMAL LOW (ref 80.0–100.0)
Platelets: 271 10*3/uL (ref 150–400)
RBC: 4.22 MIL/uL (ref 3.87–5.11)
RDW: 19.4 % — ABNORMAL HIGH (ref 11.5–15.5)
WBC: 4.2 10*3/uL (ref 4.0–10.5)
nRBC: 0 % (ref 0.0–0.2)

## 2021-12-22 LAB — BASIC METABOLIC PANEL
Anion gap: 6 (ref 5–15)
BUN: 11 mg/dL (ref 6–20)
CO2: 25 mmol/L (ref 22–32)
Calcium: 8.6 mg/dL — ABNORMAL LOW (ref 8.9–10.3)
Chloride: 105 mmol/L (ref 98–111)
Creatinine, Ser: 0.86 mg/dL (ref 0.44–1.00)
GFR, Estimated: >60 mL/min (ref >60)
Glucose, Bld: 86 mg/dL (ref 70–99)
Potassium: 4.2 mmol/L (ref 3.5–5.1)
Sodium: 136 mmol/L (ref 135–145)

## 2021-12-22 LAB — TROPONIN I (HIGH SENSITIVITY): Troponin I (High Sensitivity): <2 ng/L (ref <18)

## 2021-12-22 LAB — PREGNANCY, URINE: Preg Test, Ur: NEGATIVE

## 2021-12-22 MED ORDER — KETOROLAC TROMETHAMINE 30 MG/ML IJ SOLN
30.0000 mg | Freq: Once | INTRAMUSCULAR | Status: AC
  Administered 2021-12-22: 30 mg via INTRAVENOUS
  Filled 2021-12-22: qty 1

## 2021-12-22 MED ORDER — PROCHLORPERAZINE EDISYLATE 10 MG/2ML IJ SOLN
10.0000 mg | Freq: Once | INTRAMUSCULAR | Status: AC
  Administered 2021-12-22: 10 mg via INTRAVENOUS
  Filled 2021-12-22: qty 2

## 2021-12-22 MED ORDER — ACETAMINOPHEN 500 MG PO TABS
1000.0000 mg | ORAL_TABLET | Freq: Once | ORAL | Status: AC
  Administered 2021-12-22: 1000 mg via ORAL
  Filled 2021-12-22: qty 2

## 2021-12-22 MED ORDER — HYDROCHLOROTHIAZIDE 12.5 MG PO TABS: 12.5000 mg | ORAL_TABLET | Freq: Every day | ORAL | 2 refills | Status: AC

## 2021-12-22 NOTE — ED Provider Notes (Signed)
[facility] DEPART[facility] Provider Note   CSN: [id number] Arrival date & time: 12/22/21  1005     History  Chief Complaint  Patient presents with   Hypertension    Heather Barton is a 41 y.o. female presents to the ER from the methadone clinic due to elevated blood pressure.  She states she has also been having a right-sided headache, dizziness, intermittent chest pain for the past 2 weeks.  She does not have a history of hypertension and does not currently take any blood pressure medications.  Patient does have history of migraine headaches.  She describes it as a right-sided headache with minor visual disturbance associated with nausea and vomiting.  It was not sudden onset.  She states she no longer drinks caffeine or energy drinks.  She is a daily nicotine user, and occasional THC vape user.  She reports her blood pressure has fluctuated in the past.  Denies fever, chest tightness, leg swelling, palpitations, lightheadedness, numbness, speech difficulty, syncope, weakness, seizures.       Home Medications Prior to Admission medications   Medication Sig Start Date End Date Taking? Authorizing Provider  busPIRone (BUSPAR) 5 MG tablet TAKE 1 TABLET BY MOUTH THREE TIMES A DAY 06/06/18  Yes [provider]  hydrochlorothiazide (HYDRODIURIL) 12.5 MG tablet Take 1 tablet (12.5 mg total) by mouth daily. 12/22/21  Yes Cayton Cuevas R, PA  levETIRAcetam (KEPPRA) 1000 MG tablet Take 2,000 mg by mouth 2 (two) times daily.    Yes [provider]  sertraline (ZOLOFT) 100 MG tablet Take 100 mg by mouth daily.   Yes [provider]  ARIPiprazole (ABILIFY) 10 MG tablet Take 10 mg by mouth daily. Patient not taking: Reported on 11/24/2021    [provider]  chlorhexidine (PERIDEX) 0.12 % solution Use as directed 15 mLs in the mouth or throat 2 (two) times daily. Patient not taking: Reported on 11/24/2021 05/07/19   Sherol Dade E, PA-C   diazepam (VALIUM) 5 MG tablet Take one tablet by mouth with food one hour prior to procedure. May repeat 30 minutes prior if needed. 11/12/21   Lorine Bears, NP  diphenhydrAMINE (BENADRYL) 25 MG tablet Take 1 tablet (25 mg total) by mouth every 6 (six) hours. 04/14/15   Fredia Sorrow, MD  gabapentin (NEURONTIN) 300 MG capsule Take 600 mg by mouth 3 (three) times daily.     [provider]  lidocaine (LIDODERM) 5 % Place 1 patch onto the skin daily. Remove & Discard patch within 12 hours or as directed by MD Patient not taking: Reported on 11/24/2021 10/27/21   Mickie Hillier, PA-C  meloxicam (MOBIC) 15 MG tablet Take 1 tablet (15 mg total) by mouth daily. 10/31/21   Persons, Bevely Palmer, PA  methadone (DOLOPHINE) 10 MG tablet Take by mouth.    [provider]  methocarbamol (ROBAXIN) 500 MG tablet Take 1 tablet (500 mg total) by mouth 2 (two) times daily. 10/27/21   Mickie Hillier, PA-C  omeprazole (PRILOSEC) 40 MG capsule Take by mouth daily. 06/24/18   [provider]  permethrin (ELIMITE) 5 % cream Apply to entire body once and leave on for 8-12 hours before washing off 07/16/18   Alroy Bailiff, Margaux, PA-C  predniSONE (DELTASONE) 10 MG tablet Take 1 tablet (10 mg total) by mouth daily. 06/03/21   Horton, Alvin Critchley, DO  tiZANidine (ZANAFLEX) 4 MG tablet Take 1 tablet (4 mg total) by mouth every 6 (six) hours as needed  for muscle spasms. 11/03/21   Persons, Bevely Palmer, PA  traZODone (DESYREL) 100 MG tablet Take 100 mg by mouth at bedtime. Takes 4 tablets     '400mg'$     [provider]      Allergies    Sulfa antibiotics, Aripiprazole, Duloxetine hcl, and Vicodin [hydrocodone-acetaminophen]    Review of Systems   Review of Systems  Constitutional:  Negative for fever.  Respiratory:  Positive for shortness of breath. Negative for chest tightness.   Cardiovascular:  Positive for chest pain. Negative for palpitations and leg swelling.  Gastrointestinal:  Positive for  nausea and vomiting. Negative for diarrhea.  Neurological:  Positive for dizziness and headaches. Negative for seizures, syncope, speech difficulty, weakness, light-headedness and numbness.    Physical Exam Updated Vital Signs BP (!) 128/98 (BP Location: Right Arm)   Pulse 62   Temp 97.7 F (36.5 C) (Oral)   Resp 16   Ht '5\' 6"'$  (1.676 m)   Wt 82.6 kg   LMP 12/08/2021 (Approximate)   SpO2 97%   BMI 29.38 kg/m  Physical Exam Vitals and nursing note reviewed.  Constitutional:      General: She is not in acute distress.    Appearance: She is not ill-appearing or diaphoretic.  HENT:     Mouth/Throat:     Mouth: Mucous membranes are moist.     Pharynx: Oropharynx is clear.  Cardiovascular:     Rate and Rhythm: Normal rate and regular rhythm.     Pulses: Normal pulses.     Heart sounds: Normal heart sounds.  Pulmonary:     Effort: Pulmonary effort is normal. No respiratory distress.     Breath sounds: Normal breath sounds and air entry.  Abdominal:     General: Abdomen is flat. Bowel sounds are normal. There is no distension.     Palpations: Abdomen is soft.     Tenderness: There is no abdominal tenderness.  Musculoskeletal:     Right lower leg: No edema.     Left lower leg: No edema.  Skin:    General: Skin is warm and dry.     Capillary Refill: Capillary refill takes less than 2 seconds.  Neurological:     General: No focal deficit present.     Mental Status: She is alert and oriented to person, place, and time. Mental status is at baseline.     Cranial Nerves: Cranial nerves 2-12 are intact. No facial asymmetry.     Sensory: Sensation is intact.     Motor: Motor function is intact.     Coordination: Coordination is intact.     Gait: Gait is intact.  Psychiatric:        Mood and Affect: Mood normal.        Behavior: Behavior normal.     ED Results / Procedures / Treatments   Labs (all labs ordered are listed, but only abnormal results are displayed) Labs Reviewed   BASIC METABOLIC PANEL - Abnormal; Notable for the following components:      Result Value   Calcium 8.6 (*)    All other components within normal limits  CBC - Abnormal; Notable for the following components:   Hemoglobin 9.5 (*)    HCT 31.3 (*)    MCV 74.2 (*)    MCH 22.5 (*)    RDW 19.4 (*)    All other components within normal limits  PREGNANCY, URINE  URINALYSIS, ROUTINE W REFLEX MICROSCOPIC  TROPONIN I (HIGH SENSITIVITY)  EKG EKG Interpretation  Date/Time:  Monday December 22 2021 11:07:43 EDT Ventricular Rate:  68 PR Interval:  140 QRS Duration: 83 QT Interval:  414 QTC Calculation: 441 R Axis:   38 Text Interpretation: Sinus rhythm Probable left atrial enlargement Confirmed by Nanda Quinton [id number]) on 12/22/2021 11:21:49 AM  Radiology DG Chest 2 View  Result Date: 12/22/2021 CLINICAL DATA:  Chest pain. EXAM: CHEST - 2 VIEW COMPARISON:  Chest x-ray December 25, 2020. FINDINGS: The heart size and mediastinal contours are within normal limits. Both lungs are clear. No visible pleural effusions or pneumothorax. No acute osseous abnormality. IMPRESSION: No active cardiopulmonary disease. Electronically Signed   By: Margaretha Sheffield M.D.   On: 12/22/2021 10:54    Procedures Procedures    Medications Ordered in ED Medications  acetaminophen (TYLENOL) tablet 1,000 mg (1,000 mg Oral Given 12/22/21 1153)  ketorolac (TORADOL) 30 MG/ML injection 30 mg (30 mg Intravenous Given 12/22/21 1225)  prochlorperazine (COMPAZINE) injection 10 mg (10 mg Intravenous Given 12/22/21 1225)    ED Course/ Medical Decision Making/ A&P                           Medical Decision Making Problems Addressed: Acute nonintractable headache, unspecified headache type: acute illness or injury Anemia, unspecified type: acute illness or injury Precordial pain: acute illness or injury Uncontrolled hypertension: undiagnosed new problem with uncertain prognosis  Amount and/or Complexity of Data  Reviewed Labs: ordered. Radiology: ordered.   This patient presents to the ED with chief complaint(s) of elevated BP, headache, dizziness associated with nausea/vomiting for 2 weeks with pertinent past medical history of seizures, CVA, substance abuse, migraine headache.  The complaint involves an extensive differential diagnosis and also carries with it a high risk of complications and morbidity.    The differential diagnosis includes ACS, hypertensive urgency, pleuritis, angina, migraine headache.  Low suspicion of CVA, TIA, or SAH due to normal neuro exam, no focal deficits, and no headache red flags.  The initial plan is to obtain ECG and CXR; order labs: CBC, BMP, trop, and urinalysis.  Additional history obtained: Records reviewed Primary Care Documents  Initial Assessment:   Upon exam, patient appears comfortable and not in distress.  Heart rate and rhythm normal with normal heart sounds.  LCTAB.  CN II through XII grossly intact.  Normal sensation, motor function, and gait.  No focal deficits, she is alert and oriented.  EOM intact, conjunctive normal, and PERRL.    Independent ECG/labs interpretation:  The following labs were independently interpreted:  CBC reveals microcytic anemia, no leukocytosis. BMP reveals mild hypocalcemia, no other metabolic disturbance. UA negative, specifically for protein. ECG demonstrates sinus rhythm without evidence of ectopy, ischemia, or infarction.    Independent visualization and interpretation of imaging: I independently visualized the following imaging with scope of interpretation limited to determining acute life threatening conditions related to emergency care: Chest x-ray, which revealed normal heart size, no pneumothorax, no pleural effusions; no evidence of active cardiopulmonary disease process.  Treatment and Reassessment: Plan to treat patient's headache with Compazine, Toradol, and Tylenol.  Based on exam, findings are consistent with  migraine type headache.  She states upon reassessment headache has remained relatively unchanged.  She does appear to be resting comfortably in the bed.  Blood pressure improved since initial reading.  152/129 --> 128/98  Disposition:   Based on assessment, work-up, and treatment of patient, I believe she is stable and appropriate for discharge  with close PCP follow-up.  I have advised patient to begin taking iron supplement daily to help with suspected iron deficiency anemia.  I have also prescribed HCTZ for uncontrolled hypertension.  Advised patient to call her PCP and schedule an appointment within a week.  Strict return precautions provided.  Discussed plan of discharge and medication treatment with patient who agrees.  Discussed HPI, physical exam, assessment and plan with attending Nanda Quinton.  The attending physician evaluated patient independently as part of a shared visit and agrees with plan.        Final Clinical Impression(s) / ED Diagnoses Final diagnoses:  Acute nonintractable headache, unspecified headache type  Precordial pain  Anemia, unspecified type  Uncontrolled hypertension    Rx / DC Orders ED Discharge Orders          Ordered    hydrochlorothiazide (HYDRODIURIL) 12.5 MG tablet  Daily        12/22/21 1302              Theressa Stamps [place], [place] 12/22/21 1308    Margette Fast, MD 12/23/21 1339

## 2021-12-22 NOTE — ED Triage Notes (Signed)
Pt states she is having headache, dizziness for 2 weeks.  Pt went to methadone clinic today and BP was high.  Denies any caffeine or energy drinks lately.

## 2021-12-22 NOTE — Discharge Instructions (Addendum)
You were seen in the ER for hypertension and headache.  Your laboratory work-up and imaging are reassuring, however, you do have evidence of anemia.  I recommend starting on a daily iron supplement.  Continue to take your multivitamin as well.  Iron can sometimes cause constipation, you can use MiraLAX or increase your fiber intake if you develop constipation.  I am sending you home on a prescription called hydrochlorothiazide to help with your blood pressure.  Take this medication daily.  Please call your PCP and schedule an appointment within a week for follow-up.  Return to the ER if you develop new or worsening symptoms.

## 2022-04-01 ENCOUNTER — Other Ambulatory Visit: Payer: Self-pay

## 2022-04-01 ENCOUNTER — Emergency Department (HOSPITAL_BASED_OUTPATIENT_CLINIC_OR_DEPARTMENT_OTHER)
Admission: EM | Admit: 2022-04-01 | Discharge: 2022-04-01 | Disposition: A | Discharge status: Home / Self Care | Payer: No Typology Code available for payment source | Attending: Emergency Medicine | Admitting: Emergency Medicine

## 2022-04-01 DIAGNOSIS — R42 Dizziness and giddiness: Secondary | ICD-10-CM | POA: Diagnosis present

## 2022-04-01 DIAGNOSIS — I10 Essential (primary) hypertension: Secondary | ICD-10-CM

## 2022-04-01 DIAGNOSIS — Z79899 Other long term (current) drug therapy: Secondary | ICD-10-CM | POA: Insufficient documentation

## 2022-04-01 DIAGNOSIS — E876 Hypokalemia: Secondary | ICD-10-CM | POA: Insufficient documentation

## 2022-04-01 LAB — URINALYSIS, ROUTINE W REFLEX MICROSCOPIC
Bilirubin Urine: NEGATIVE
Glucose, UA: NEGATIVE mg/dL
Hgb urine dipstick: NEGATIVE
Ketones, ur: NEGATIVE mg/dL
Leukocytes,Ua: NEGATIVE
Nitrite: NEGATIVE
Protein, ur: NEGATIVE mg/dL
Specific Gravity, Urine: >=1.03 (ref 1.005–1.030)
pH: 5.5 (ref 5.0–8.0)

## 2022-04-01 LAB — CBC
HCT: 40 % (ref 36.0–46.0)
Hemoglobin: 12 g/dL (ref 12.0–15.0)
MCH: 21.1 pg — ABNORMAL LOW (ref 26.0–34.0)
MCHC: 30 g/dL (ref 30.0–36.0)
MCV: 70.2 fL — ABNORMAL LOW (ref 80.0–100.0)
Platelets: 526 10*3/uL — ABNORMAL HIGH (ref 150–400)
RBC: 5.7 MIL/uL — ABNORMAL HIGH (ref 3.87–5.11)
RDW: 17.7 % — ABNORMAL HIGH (ref 11.5–15.5)
WBC: 7.2 10*3/uL (ref 4.0–10.5)
nRBC: 0 % (ref 0.0–0.2)

## 2022-04-01 LAB — BASIC METABOLIC PANEL
Anion gap: 12 (ref 5–15)
BUN: 9 mg/dL (ref 6–20)
CO2: 23 mmol/L (ref 22–32)
Calcium: 8.6 mg/dL — ABNORMAL LOW (ref 8.9–10.3)
Chloride: 96 mmol/L — ABNORMAL LOW (ref 98–111)
Creatinine, Ser: 1.12 mg/dL — ABNORMAL HIGH (ref 0.44–1.00)
GFR, Estimated: >60 mL/min (ref >60)
Glucose, Bld: 150 mg/dL — ABNORMAL HIGH (ref 70–99)
Potassium: 3.3 mmol/L — ABNORMAL LOW (ref 3.5–5.1)
Sodium: 131 mmol/L — ABNORMAL LOW (ref 135–145)

## 2022-04-01 LAB — PREGNANCY, URINE: Preg Test, Ur: NEGATIVE

## 2022-04-01 MED ORDER — SODIUM CHLORIDE 0.9 % IV BOLUS
1000.0000 mL | Freq: Once | INTRAVENOUS | Status: AC
  Administered 2022-04-01: 1000 mL via INTRAVENOUS

## 2022-04-01 MED ORDER — AMLODIPINE BESYLATE 5 MG PO TABS
5.0000 mg | ORAL_TABLET | Freq: Once | ORAL | Status: AC
  Administered 2022-04-01: 5 mg via ORAL
  Filled 2022-04-01: qty 1

## 2022-04-01 NOTE — ED Provider Notes (Signed)
[facility] Provider Note   CSN: [id number] Arrival date & time: 04/01/22  1359     History {Add pertinent medical, surgical, social history, OB history to HPI:1} Chief Complaint  Patient presents with   Dizziness    Heather Barton is a 42 y.o. female.  She has history of elevated blood pressure and mood disorder.  She said she has been out of one of her blood pressure medicines for a week.  For the last 3 days she has had elevated heart rate feeling dizzy lightheaded nauseous especially when she stands.  She is unable to get her prescription filled for a few more days.  She does not think she has had a fever but she has had cold chills.  She vomited once earlier today.  No diarrhea no urinary symptoms.  The history is provided by the patient.  Dizziness Quality:  Lightheadedness Onset quality:  Gradual Duration:  3 days Timing:  Intermittent Progression:  Unchanged Chronicity:  New Context: standing up   Relieved by:  Nothing Worsened by:  Standing up Ineffective treatments:  Lying down Associated symptoms: nausea and vomiting   Associated symptoms: no chest pain        Home Medications Prior to Admission medications   Medication Sig Start Date End Date Taking? Authorizing Provider  ARIPiprazole (ABILIFY) 10 MG tablet Take 10 mg by mouth daily. Patient not taking: Reported on 11/24/2021    [provider]  busPIRone (BUSPAR) 5 MG tablet TAKE 1 TABLET BY MOUTH THREE TIMES A DAY 06/06/18   [provider]  chlorhexidine (PERIDEX) 0.12 % solution Use as directed 15 mLs in the mouth or throat 2 (two) times daily. Patient not taking: Reported on 11/24/2021 05/07/19   Sherol Dade E, PA-C  diazepam (VALIUM) 5 MG tablet Take one tablet by mouth with food one hour prior to procedure. May repeat 30 minutes prior if needed. 11/12/21   Lorine Bears, NP  diphenhydrAMINE (BENADRYL) 25 MG tablet Take 1 tablet (25  mg total) by mouth every 6 (six) hours. 04/14/15   Fredia Sorrow, MD  gabapentin (NEURONTIN) 300 MG capsule Take 600 mg by mouth 3 (three) times daily.     [provider]  hydrochlorothiazide (HYDRODIURIL) 12.5 MG tablet Take 1 tablet (12.5 mg total) by mouth daily. 12/22/21   Clark, Meghan R, PA  levETIRAcetam (KEPPRA) 1000 MG tablet Take 2,000 mg by mouth 2 (two) times daily.     [provider]  lidocaine (LIDODERM) 5 % Place 1 patch onto the skin daily. Remove & Discard patch within 12 hours or as directed by MD Patient not taking: Reported on 11/24/2021 10/27/21   Mickie Hillier, PA-C  meloxicam (MOBIC) 15 MG tablet Take 1 tablet (15 mg total) by mouth daily. 10/31/21   Persons, Bevely Palmer, PA  methadone (DOLOPHINE) 10 MG tablet Take by mouth.    [provider]  methocarbamol (ROBAXIN) 500 MG tablet Take 1 tablet (500 mg total) by mouth 2 (two) times daily. 10/27/21   Mickie Hillier, PA-C  omeprazole (PRILOSEC) 40 MG capsule Take by mouth daily. 06/24/18   [provider]  permethrin (ELIMITE) 5 % cream Apply to entire body once and leave on for 8-12 hours before washing off 07/16/18   Alroy Bailiff, Margaux, PA-C  predniSONE (DELTASONE) 10 MG tablet Take 1 tablet (10 mg total) by mouth daily. 06/03/21   Horton, Alvin Critchley, DO  sertraline (ZOLOFT) 100 MG  tablet Take 100 mg by mouth daily.    [provider]  tiZANidine (ZANAFLEX) 4 MG tablet Take 1 tablet (4 mg total) by mouth every 6 (six) hours as needed for muscle spasms. 11/03/21   Persons, Bevely Palmer, PA  traZODone (DESYREL) 100 MG tablet Take 100 mg by mouth at bedtime. Takes 4 tablets     '400mg'$     [provider]      Allergies    Sulfa antibiotics, Aripiprazole, Duloxetine hcl, and Vicodin [hydrocodone-acetaminophen]    Review of Systems   Review of Systems  Constitutional:  Positive for chills. Negative for fever.  Eyes:  Negative for visual disturbance.  Cardiovascular:  Negative for chest  pain.  Gastrointestinal:  Positive for nausea and vomiting.  Genitourinary:  Negative for dysuria.  Neurological:  Positive for dizziness and light-headedness.    Physical Exam Updated Vital Signs BP (!) 147/110   Pulse (!) 127   Temp 98.1 F (36.7 C) (Oral)   Resp 20   Ht '5\' 6"'$  (1.676 m)   Wt 82.6 kg   SpO2 96%   BMI 29.38 kg/m  Physical Exam Vitals and nursing note reviewed.  Constitutional:      General: She is not in acute distress.    Appearance: Normal appearance. She is well-developed.  HENT:     Head: Normocephalic and atraumatic.  Eyes:     Conjunctiva/sclera: Conjunctivae normal.  Cardiovascular:     Rate and Rhythm: Regular rhythm. Tachycardia present.     Heart sounds: No murmur heard. Pulmonary:     Effort: Pulmonary effort is normal. No respiratory distress.     Breath sounds: Normal breath sounds.  Abdominal:     Palpations: Abdomen is soft.     Tenderness: There is no abdominal tenderness. There is no guarding or rebound.  Musculoskeletal:        General: No deformity.     Cervical back: Neck supple.     Right lower leg: No edema.     Left lower leg: No edema.  Skin:    General: Skin is warm and dry.     Capillary Refill: Capillary refill takes less than 2 seconds.  Neurological:     General: No focal deficit present.     Mental Status: She is alert and oriented to person, place, and time.     Cranial Nerves: No cranial nerve deficit.     Sensory: No sensory deficit.     Motor: No weakness.     ED Results / Procedures / Treatments   Labs (all labs ordered are listed, but only abnormal results are displayed) Labs Reviewed  BASIC METABOLIC PANEL - Abnormal; Notable for the following components:      Result Value   Sodium 131 (*)    Potassium 3.3 (*)    Chloride 96 (*)    Glucose, Bld 150 (*)    Creatinine, Ser 1.12 (*)    Calcium 8.6 (*)    All other components within normal limits  CBC - Abnormal; Notable for the following components:    RBC 5.70 (*)    MCV 70.2 (*)    MCH 21.1 (*)    RDW 17.7 (*)    Platelets 526 (*)    All other components within normal limits  URINALYSIS, ROUTINE W REFLEX MICROSCOPIC  PREGNANCY, URINE    EKG EKG Interpretation  Date/Time:  Wednesday April 01 2022 14:21:26 EST Ventricular Rate:  121 PR Interval:  126 QRS Duration: 79  QT Interval:  308 QTC Calculation: 437 R Axis:   41 Text Interpretation: Sinus tachycardia Borderline T abnormalities, inferior leads Confirmed by Aletta Edouard [phone]) on 04/01/2022 3:00:54 PM  Radiology No results found.  Procedures Procedures  {Document cardiac monitor, telemetry assessment procedure when appropriate:1}  Medications Ordered in ED Medications  sodium chloride 0.9 % bolus 1,000 mL (has no administration in time range)    ED Course/ Medical Decision Making/ A&P   {   Click here for ABCD2, HEART and other calculatorsREFRESH Note before signing :1}                          Medical Decision Making Amount and/or Complexity of Data Reviewed Labs: ordered.   This patient complains of ***; this involves an extensive number of treatment Options and is a complaint that carries with it a high risk of complications and morbidity. The differential includes ***  I ordered, reviewed and interpreted labs, which included *** I ordered medication *** and reviewed PMP when indicated. I ordered imaging studies which included *** and I independently    visualized and interpreted imaging which showed *** Additional history obtained from *** Previous records obtained and reviewed *** I consulted *** and discussed lab and imaging findings and discussed disposition.  Cardiac monitoring reviewed, *** Social determinants considered, *** Critical Interventions: ***  After the interventions stated above, I reevaluated the patient and found *** Admission and further testing considered, ***   {Document critical care time when  appropriate:1} {Document review of labs and clinical decision tools ie heart score, Chads2Vasc2 etc:1}  {Document your independent review of radiology images, and any outside records:1} {Document your discussion with family members, caretakers, and with consultants:1} {Document social determinants of health affecting pt's care:1} {Document your decision making why or why not admission, treatments were needed:1} Final Clinical Impression(s) / ED Diagnoses Final diagnoses:  None    Rx / DC Orders ED Discharge Orders     None

## 2022-04-01 NOTE — ED Notes (Signed)
Discharge paperwork reviewed entirely with patient, including Rx's and follow up care. Pain was under control. Pt verbalized understanding as well as all parties involved. No questions or concerns voiced at the time of discharge. No acute distress noted.   Pt ambulated out to PVA without incident or assistance.  

## 2022-04-01 NOTE — ED Triage Notes (Signed)
Patient presents to ED via POV from home. Reports dizziness x 3 days. States she has ran out of her blood pressure medicine. Last dose was a week ago. Also reports feeling faint.

## 2022-04-01 NOTE — ED Notes (Signed)
Pt states has history of seizures and feels "one is coming". Paramedic in room. Seizure pads applied to bed.

## 2022-04-01 NOTE — Discharge Instructions (Signed)
You were seen in the emergency department for dizziness and being out of one of your blood pressure medications.  Your blood pressure and heart rate were high your symptoms improved with a dose of your blood pressure medication and some IV fluids.  Your potassium was mildly low.  Please continue regular medications and restart your blood pressure medication when you are able.  Follow-up with your regular doctor.  Return if any worsening or concerning symptoms

## 2022-05-04 ENCOUNTER — Other Ambulatory Visit: Payer: Self-pay

## 2022-05-04 ENCOUNTER — Encounter (HOSPITAL_BASED_OUTPATIENT_CLINIC_OR_DEPARTMENT_OTHER): Payer: Self-pay

## 2022-05-04 DIAGNOSIS — F17210 Nicotine dependence, cigarettes, uncomplicated: Secondary | ICD-10-CM | POA: Insufficient documentation

## 2022-05-04 DIAGNOSIS — Z8673 Personal history of transient ischemic attack (TIA), and cerebral infarction without residual deficits: Secondary | ICD-10-CM | POA: Insufficient documentation

## 2022-05-04 DIAGNOSIS — R0981 Nasal congestion: Secondary | ICD-10-CM | POA: Diagnosis present

## 2022-05-04 DIAGNOSIS — J0100 Acute maxillary sinusitis, unspecified: Secondary | ICD-10-CM | POA: Insufficient documentation

## 2022-05-04 LAB — CBC WITH DIFFERENTIAL/PLATELET
Abs Immature Granulocytes: 0.01 10*3/uL (ref 0.00–0.07)
Basophils Absolute: 0 10*3/uL (ref 0.0–0.1)
Basophils Relative: 0 %
Eosinophils Absolute: 0 10*3/uL (ref 0.0–0.5)
Eosinophils Relative: 0 %
HCT: 30.2 % — ABNORMAL LOW (ref 36.0–46.0)
Hemoglobin: 9.3 g/dL — ABNORMAL LOW (ref 12.0–15.0)
Immature Granulocytes: 0 %
Lymphocytes Relative: 36 %
Lymphs Abs: 2.4 10*3/uL (ref 0.7–4.0)
MCH: 21.4 pg — ABNORMAL LOW (ref 26.0–34.0)
MCHC: 30.8 g/dL (ref 30.0–36.0)
MCV: 69.4 fL — ABNORMAL LOW (ref 80.0–100.0)
Monocytes Absolute: 0.3 10*3/uL (ref 0.1–1.0)
Monocytes Relative: 5 %
Neutro Abs: 3.9 10*3/uL (ref 1.7–7.7)
Neutrophils Relative %: 59 %
Platelets: 458 10*3/uL — ABNORMAL HIGH (ref 150–400)
RBC: 4.35 MIL/uL (ref 3.87–5.11)
RDW: 17.4 % — ABNORMAL HIGH (ref 11.5–15.5)
WBC: 6.6 10*3/uL (ref 4.0–10.5)
nRBC: 0 % (ref 0.0–0.2)

## 2022-05-04 LAB — BASIC METABOLIC PANEL
Anion gap: 7 (ref 5–15)
BUN: 7 mg/dL (ref 6–20)
CO2: 23 mmol/L (ref 22–32)
Calcium: 8.4 mg/dL — ABNORMAL LOW (ref 8.9–10.3)
Chloride: 104 mmol/L (ref 98–111)
Creatinine, Ser: 0.86 mg/dL (ref 0.44–1.00)
GFR, Estimated: >60 mL/min (ref >60)
Glucose, Bld: 103 mg/dL — ABNORMAL HIGH (ref 70–99)
Potassium: 3.7 mmol/L (ref 3.5–5.1)
Sodium: 134 mmol/L — ABNORMAL LOW (ref 135–145)

## 2022-05-04 LAB — PREGNANCY, URINE: Preg Test, Ur: NEGATIVE

## 2022-05-04 NOTE — ED Triage Notes (Signed)
Pt arrives with c/o left sided sinus pain that started about a month ago. Pt endorse bilateral ear pain. Pt denies fevers.

## 2022-05-05 ENCOUNTER — Emergency Department (HOSPITAL_BASED_OUTPATIENT_CLINIC_OR_DEPARTMENT_OTHER)
Admission: EM | Admit: 2022-05-05 | Discharge: 2022-05-05 | Disposition: A | Discharge status: Home / Self Care | Payer: No Typology Code available for payment source | Attending: Emergency Medicine | Admitting: Emergency Medicine

## 2022-05-05 DIAGNOSIS — J0100 Acute maxillary sinusitis, unspecified: Secondary | ICD-10-CM

## 2022-05-05 MED ORDER — DOXYCYCLINE HYCLATE 100 MG PO TABS
100.0000 mg | ORAL_TABLET | Freq: Once | ORAL | Status: AC
  Administered 2022-05-05: 100 mg via ORAL
  Filled 2022-05-05: qty 1

## 2022-05-05 MED ORDER — FLUTICASONE PROPIONATE 50 MCG/ACT NA SUSP
2.0000 | Freq: Every day | NASAL | Status: DC
  Administered 2022-05-05: 2 via NASAL
  Filled 2022-05-05: qty 16

## 2022-05-05 MED ORDER — DOXYCYCLINE HYCLATE 100 MG PO CAPS: 100.0000 mg | ORAL_CAPSULE | Freq: Two times a day (BID) | ORAL | 0 refills | Status: AC

## 2022-05-05 NOTE — ED Provider Notes (Signed)
[facility] Provider Note: Georgena Spurling, MD, FACEP  CSN: [id number] MRN: [id number] ARRIVAL: 05/04/22 at 2144 ROOM: [place]  Facial Pain   HISTORY OF PRESENT ILLNESS  05/05/22 2:01 AM Heather Barton is a 42 y.o. female who has had nasal congestion, primarily on the left, since the beginning of February of this year.  She has sinus pressure and postnasal drip.  She also feels pressure in her ears.  She rates associated pain as a 10 out of 10.  She has been using oxymetazoline on a regular basis but feels that this is making symptoms worse.  She has a "pimple" inside her left naris now as well.   Past Medical History:  Diagnosis Date   Bipolar 1 disorder ([place])    Brain tumor (benign) ([place])    Personality disorder ([place])    PTSD (post-traumatic stress disorder)    Seizures ([place])    last seizure 2 weeks ago   Stroke [facility])    Substance abuse ([place])     Past Surgical History:  Procedure Laterality Date   LEG SURGERY     TUBAL LIGATION     TUMOR REMOVAL     WRIST SURGERY      History reviewed. No pertinent family history.  Social History   Tobacco Use   Smoking status: Every Day    Packs/day: 1.00    Types: Cigarettes   Smokeless tobacco: Never  Vaping Use   Vaping Use: Every day  Substance Use Topics   Alcohol use: Not Currently   Drug use: Not Currently    Prior to Admission medications   Medication Sig Start Date End Date Taking? Authorizing Provider  doxycycline (VIBRAMYCIN) 100 MG capsule Take 1 capsule (100 mg total) by mouth 2 (two) times daily. One po bid x 7 days 05/05/22  Yes Amonte Brookover, MD  busPIRone (BUSPAR) 5 MG tablet TAKE 1 TABLET BY MOUTH THREE TIMES A DAY 06/06/18   [provider]  diazepam (VALIUM) 5 MG tablet Take one tablet by mouth with food one hour prior to procedure. May repeat 30 minutes prior if needed. 11/12/21   Lorine Bears, NP  diphenhydrAMINE (BENADRYL) 25 MG tablet Take 1 tablet (25 mg  total) by mouth every 6 (six) hours. 04/14/15   Fredia Sorrow, MD  gabapentin (NEURONTIN) 300 MG capsule Take 600 mg by mouth 3 (three) times daily.     [provider]  hydrochlorothiazide (HYDRODIURIL) 12.5 MG tablet Take 1 tablet (12.5 mg total) by mouth daily. 12/22/21   Clark, Meghan R, PA  levETIRAcetam (KEPPRA) 1000 MG tablet Take 2,000 mg by mouth 2 (two) times daily.     [provider]  meloxicam (MOBIC) 15 MG tablet Take 1 tablet (15 mg total) by mouth daily. 10/31/21   Persons, Bevely Palmer, PA  methadone (DOLOPHINE) 10 MG tablet Take by mouth.    [provider]  methocarbamol (ROBAXIN) 500 MG tablet Take 1 tablet (500 mg total) by mouth 2 (two) times daily. 10/27/21   Mickie Hillier, PA-C  omeprazole (PRILOSEC) 40 MG capsule Take by mouth daily. 06/24/18   [provider]  permethrin (ELIMITE) 5 % cream Apply to entire body once and leave on for 8-12 hours before washing off 07/16/18   Alroy Bailiff, Margaux, PA-C  sertraline (ZOLOFT) 100 MG tablet Take 100 mg by mouth daily.    [provider]  tiZANidine (ZANAFLEX) 4 MG tablet Take 1 tablet (4 mg  total) by mouth every 6 (six) hours as needed for muscle spasms. 11/03/21   Persons, Bevely Palmer, PA  traZODone (DESYREL) 100 MG tablet Take 100 mg by mouth at bedtime. Takes 4 tablets     '400mg'$     [provider]    Allergies Sulfa antibiotics, Aripiprazole, Duloxetine hcl, and Vicodin [hydrocodone-acetaminophen]   REVIEW OF SYSTEMS  Negative except as noted here or in the History of Present Illness.   PHYSICAL EXAMINATION  Initial Vital Signs Blood pressure (!) 137/117, pulse (!) 101, temperature 98.7 F (37.1 C), resp. rate 18, height '5\' 6"'$  (1.676 m), weight 83 kg, SpO2 100 %.  Examination General: Well-developed, well-nourished female in no acute distress; appearance consistent with age of record HENT: normocephalic; atraumatic; TMs normal; nasal congestion; mild tenderness of left  maxillary area without erythema or warmth Eyes: Normal appearance Neck: supple Heart: regular rate and rhythm Lungs: clear to auscultation bilaterally Abdomen: soft; nondistended; nontender; bowel sounds present Extremities: No deformity; full range of motion Neurologic: Awake, alert and oriented; motor function intact in all extremities and symmetric; no facial droop Skin: Warm and dry Psychiatric: Normal mood and affect   RESULTS  Summary of this visit's results, reviewed and interpreted by myself:   EKG Interpretation  Date/Time:    Ventricular Rate:    PR Interval:    QRS Duration:   QT Interval:    QTC Calculation:   R Axis:     Text Interpretation:         Laboratory Studies: Results for orders placed or performed during the hospital encounter of 05/05/22 (from the past 24 hour(s))  CBC with Differential     Status: Abnormal   Collection Time: 05/04/22 10:00 PM  Result Value Ref Range   WBC 6.6 4.0 - 10.5 K/uL   RBC 4.35 3.87 - 5.11 MIL/uL   Hemoglobin 9.3 (L) 12.0 - 15.0 g/dL   HCT 30.2 (L) 36.0 - 46.0 %   MCV 69.4 (L) 80.0 - 100.0 fL   MCH 21.4 (L) 26.0 - 34.0 pg   MCHC 30.8 30.0 - 36.0 g/dL   RDW 17.4 (H) 11.5 - 15.5 %   Platelets 458 (H) 150 - 400 K/uL   nRBC 0.0 0.0 - 0.2 %   Neutrophils Relative % 59 %   Neutro Abs 3.9 1.7 - 7.7 K/uL   Lymphocytes Relative 36 %   Lymphs Abs 2.4 0.7 - 4.0 K/uL   Monocytes Relative 5 %   Monocytes Absolute 0.3 0.1 - 1.0 K/uL   Eosinophils Relative 0 %   Eosinophils Absolute 0.0 0.0 - 0.5 K/uL   Basophils Relative 0 %   Basophils Absolute 0.0 0.0 - 0.1 K/uL   Immature Granulocytes 0 %   Abs Immature Granulocytes 0.01 0.00 - 0.07 K/uL  Basic metabolic panel     Status: Abnormal   Collection Time: 05/04/22 10:00 PM  Result Value Ref Range   Sodium 134 (L) 135 - 145 mmol/L   Potassium 3.7 3.5 - 5.1 mmol/L   Chloride 104 98 - 111 mmol/L   CO2 23 22 - 32 mmol/L   Glucose, Bld 103 (H) 70 - 99 mg/dL   BUN 7 6 - 20  mg/dL   Creatinine, Ser 0.86 0.44 - 1.00 mg/dL   Calcium 8.4 (L) 8.9 - 10.3 mg/dL   GFR, Estimated >60 >60 mL/min   Anion gap 7 5 - 15  Pregnancy, urine     Status: None   Collection Time: 05/04/22  11:32 PM  Result Value Ref Range   Preg Test, Ur NEGATIVE NEGATIVE   Imaging Studies: No results found.  ED COURSE and MDM  Nursing notes, initial and subsequent vitals signs, including pulse oximetry, reviewed and interpreted by myself.  Vitals:   05/04/22 2157 05/04/22 2157  BP:  (!) 137/117  Pulse:  (!) 101  Resp:  18  Temp:  98.7 F (37.1 C)  SpO2:  100%  Weight: 83 kg   Height: '5\' 6"'$  (1.676 m)    Medications  fluticasone (FLONASE) 50 MCG/ACT nasal spray 2 spray (2 sprays Each Nare Given 05/05/22 0210)  doxycycline (VIBRA-TABS) tablet 100 mg (100 mg Oral Given 05/05/22 0210)   Patient advised to discontinue oxymetazoline as it is only indicated for 3 days.  She is likely suffering rebound.  We will start her on Flonase nasal steroid.  Will also start her on doxycycline for both sinusitis and the small pustule in her left naris.   PROCEDURES  Procedures   ED DIAGNOSES     ICD-10-CM   1. Acute maxillary sinusitis, recurrence not specified  J01.00          Kamorah Nevils, Jenny Reichmann, MD 05/05/22 [phone]

## 2022-05-13 IMAGING — CT CT CERVICAL SPINE W/O CM
3 of 4 series · 12 of 33 positions shown, 14 images · non-contrast
Comparison: None.

CLINICAL DATA: Headache with hand tingling

EXAM:
CT HEAD WITHOUT CONTRAST
CT CERVICAL SPINE WITHOUT CONTRAST
TECHNIQUE: Multidetector CT imaging of the head and cervical spine was
performed following the standard protocol without intravenous
contrast. Multiplanar CT image reconstructions of the cervical spine
were also generated.

[Series 3: c_spine 2.0 i30s 3 · axial · 0.29mm/px · z∈[+1152,+1264]mm · 4 of 85 slices shown, 5 images]
[im 15/85  soft-tissue]
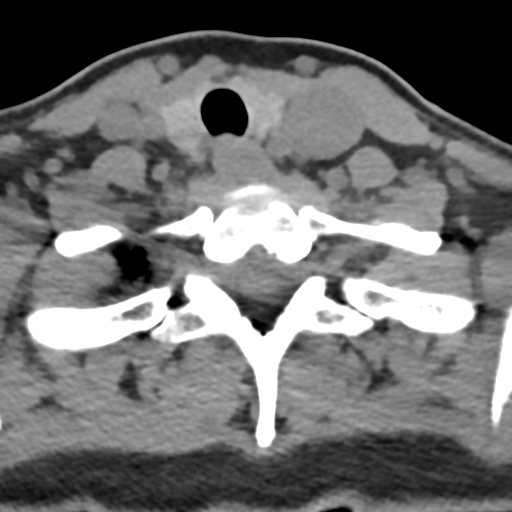
[im 15/85  bone]
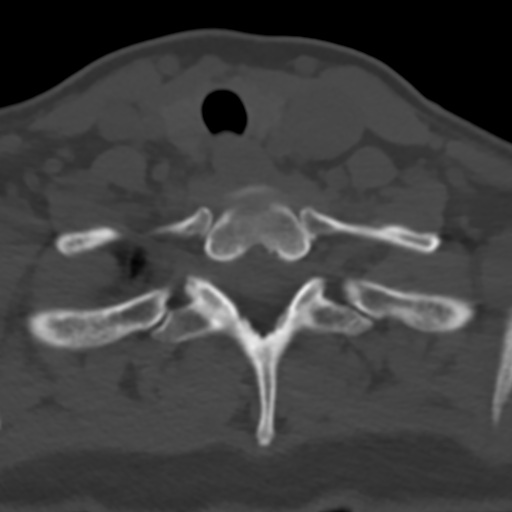
[im 29/85  bone]
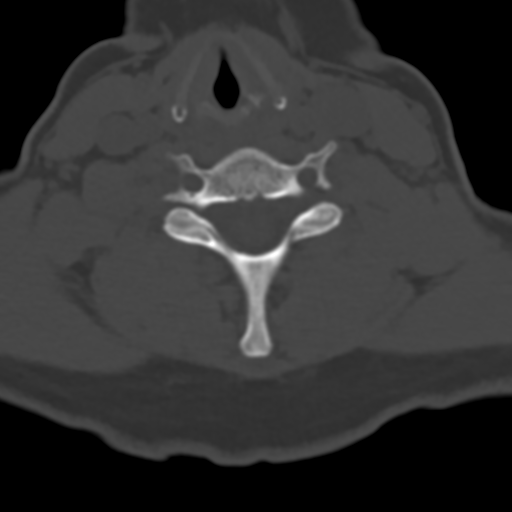
[im 57/85  bone]
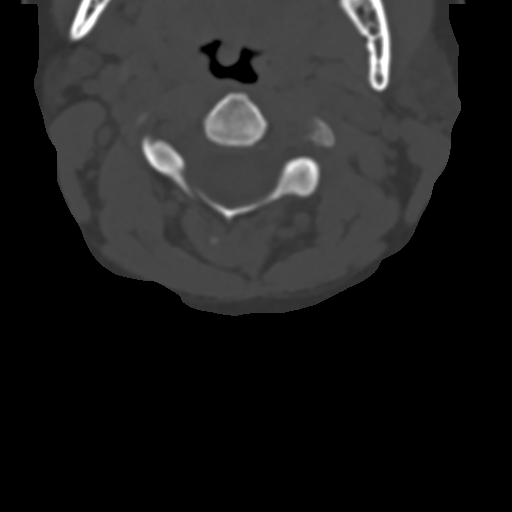
[im 71/85  bone]
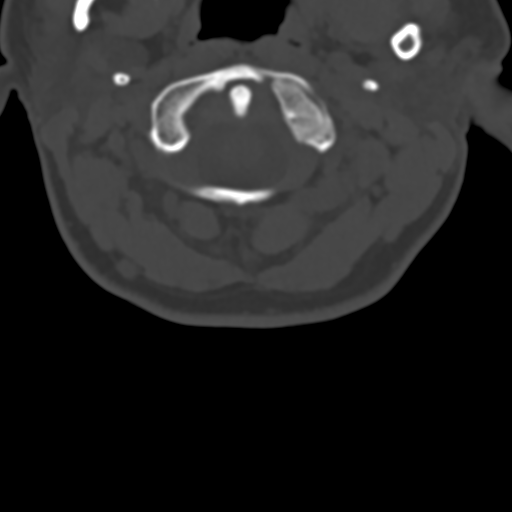

[Series 5: coronals · coronal · 0.25mm/px · 3 of 61 slices shown]
[im 13/61  bone]
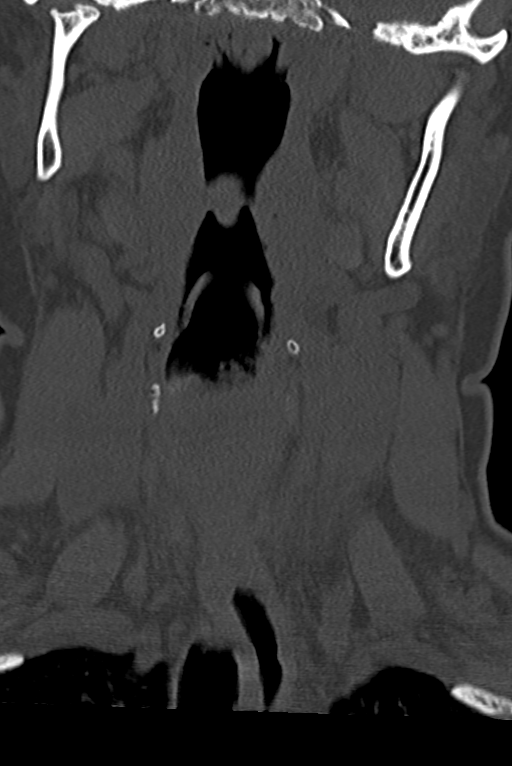
[im 25/61  bone]
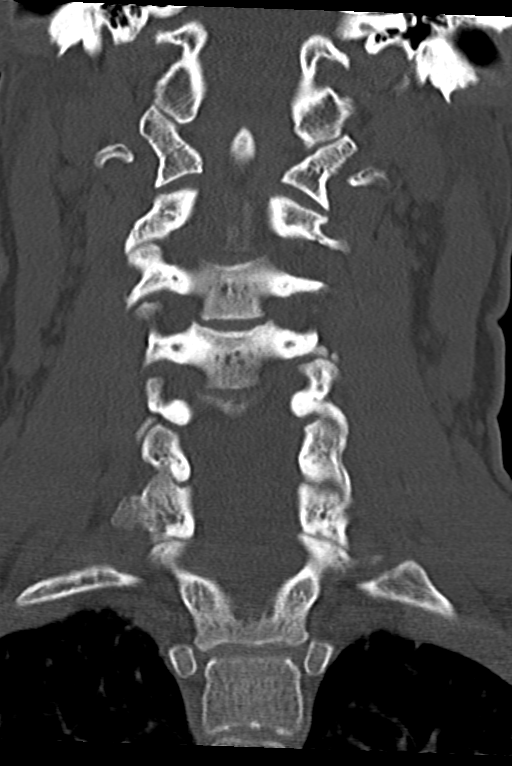
[im 37/61  bone]
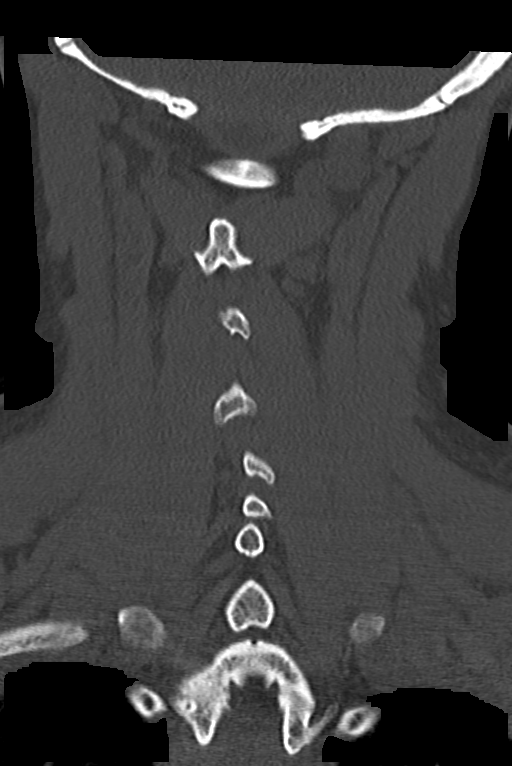

[Series 6: sagittals · sagittal · 0.25mm/px · 5 of 61 slices shown, 6 images]
[im 21/61  bone]
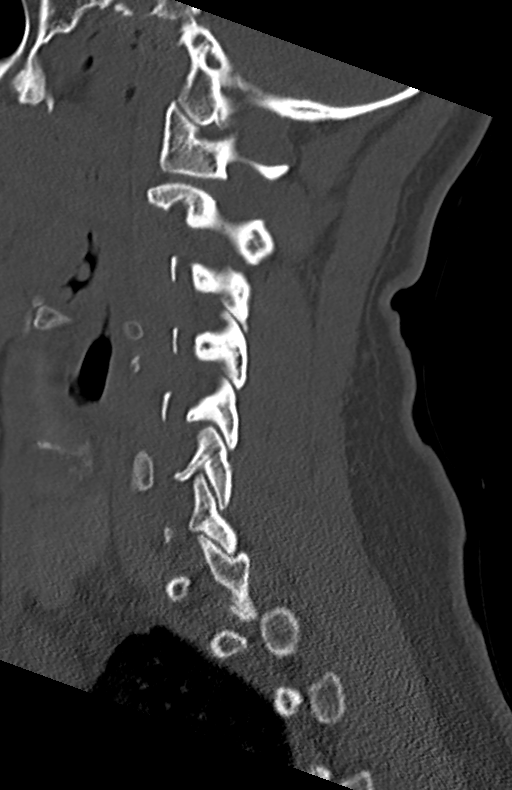
[im 26/61  bone]
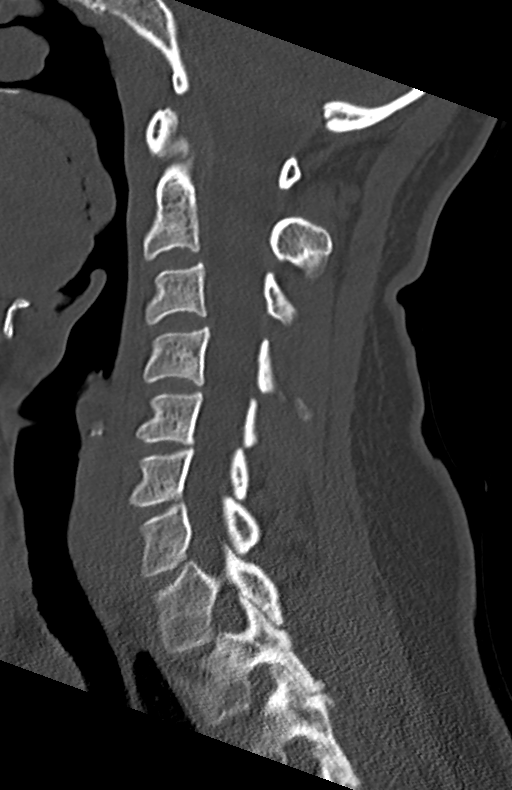
[im 31/61  soft-tissue]
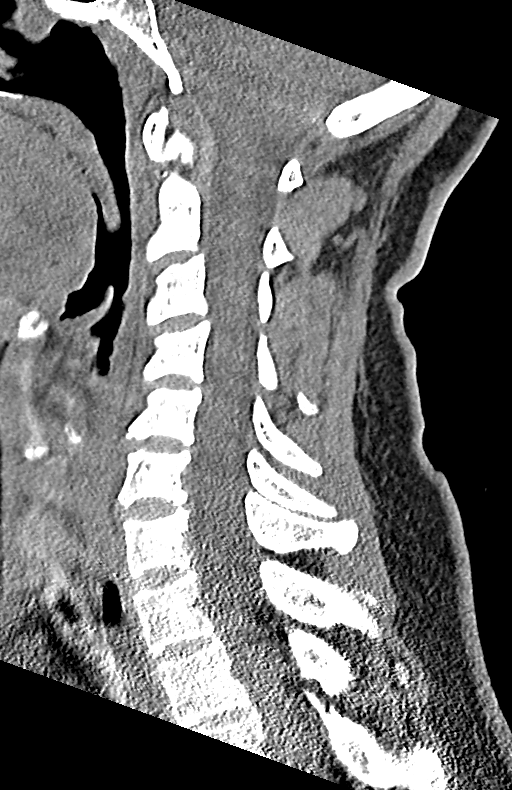
[im 31/61  bone]
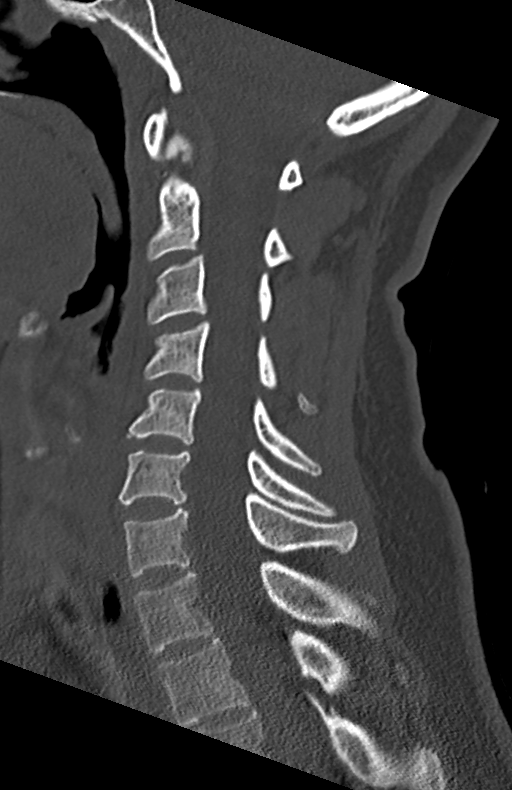
[im 36/61  bone]
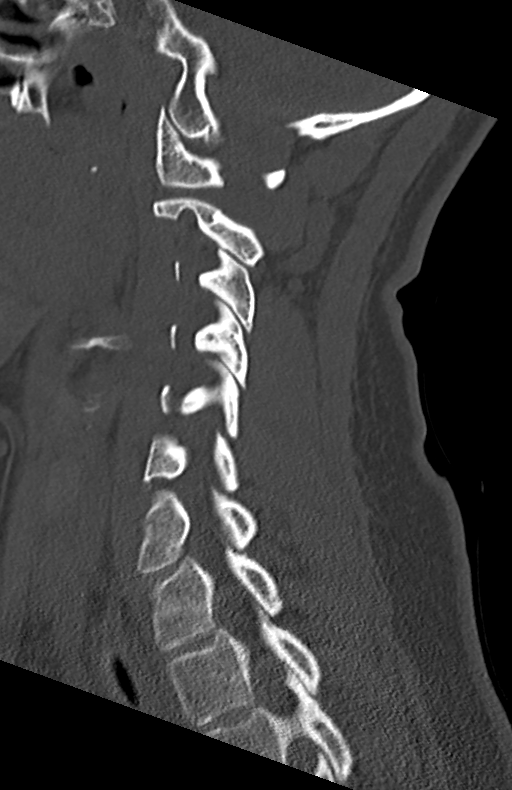
[im 41/61  bone]
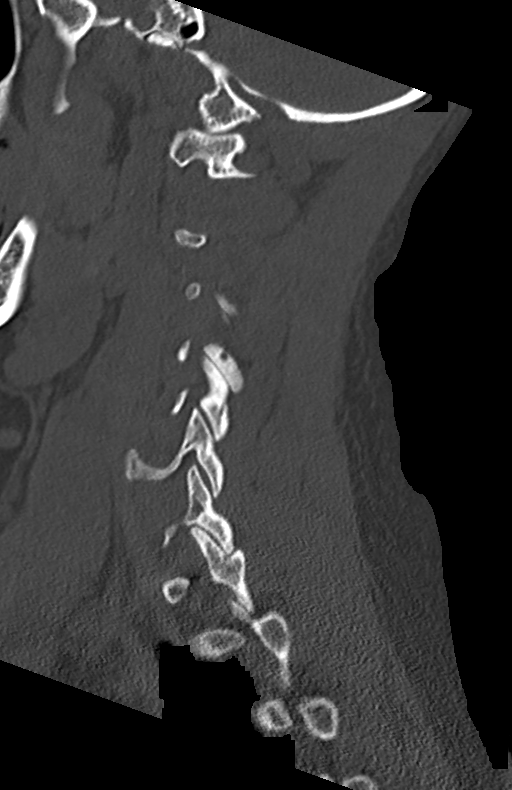

[12 of 33 positions shown; findings below may reference images not displayed]

FINDINGS: CT HEAD FINDINGS

Brain: There is no mass, hemorrhage or extra-axial collection. The
size and configuration of the ventricles and extra-axial CSF spaces
are normal. Old small vessel infarct of the left basal ganglia.
Brain parenchyma otherwise normal.

Vascular: No abnormal hyperdensity of the major intracranial
arteries or dural venous sinuses. No intracranial atherosclerosis.

Skull: The visualized skull base, calvarium and extracranial soft
tissues are normal.

Sinuses/Orbits: No fluid levels or advanced mucosal thickening of
the visualized paranasal sinuses. No mastoid or middle ear effusion.
The orbits are normal.

CT CERVICAL SPINE FINDINGS

Alignment: No static subluxation. Facets are aligned. Occipital
condyles are normally positioned.

Skull base and vertebrae: No acute fracture.

Soft tissues and spinal canal: No prevertebral fluid or swelling. No
visible canal hematoma.

Disc levels: No advanced spinal canal or neural foraminal stenosis.

Upper chest: No pneumothorax, pulmonary nodule or pleural effusion.

Other: Normal visualized paraspinal cervical soft tissues.
IMPRESSION: 1. No acute intracranial abnormality.
2. Old small vessel infarct of the left basal ganglia.
3. No acute fracture or static subluxation of the cervical spine.

## 2022-05-28 ENCOUNTER — Emergency Department (HOSPITAL_BASED_OUTPATIENT_CLINIC_OR_DEPARTMENT_OTHER)
Admission: EM | Admit: 2022-05-28 | Discharge: 2022-05-28 | Discharge status: Against Medical Advice | Payer: No Typology Code available for payment source | Attending: Emergency Medicine | Admitting: Emergency Medicine

## 2022-05-28 ENCOUNTER — Emergency Department (HOSPITAL_BASED_OUTPATIENT_CLINIC_OR_DEPARTMENT_OTHER): Payer: No Typology Code available for payment source

## 2022-05-28 ENCOUNTER — Encounter (HOSPITAL_BASED_OUTPATIENT_CLINIC_OR_DEPARTMENT_OTHER): Payer: Self-pay | Admitting: *Deleted

## 2022-05-28 ENCOUNTER — Other Ambulatory Visit: Payer: Self-pay

## 2022-05-28 DIAGNOSIS — Z1152 Encounter for screening for COVID-19: Secondary | ICD-10-CM | POA: Diagnosis not present

## 2022-05-28 DIAGNOSIS — Z5321 Procedure and treatment not carried out due to patient leaving prior to being seen by health care provider: Secondary | ICD-10-CM | POA: Insufficient documentation

## 2022-05-28 DIAGNOSIS — M79605 Pain in left leg: Secondary | ICD-10-CM | POA: Diagnosis present

## 2022-05-28 DIAGNOSIS — M79604 Pain in right leg: Secondary | ICD-10-CM | POA: Insufficient documentation

## 2022-05-28 LAB — RESP PANEL BY RT-PCR (RSV, FLU A&B, COVID)  RVPGX2
Influenza A by PCR: NEGATIVE
Influenza B by PCR: NEGATIVE
Resp Syncytial Virus by PCR: NEGATIVE
SARS Coronavirus 2 by RT PCR: NEGATIVE

## 2022-05-28 NOTE — ED Triage Notes (Signed)
Left leg pain for the past 10 days pt reports bilateral leg pain and swelling L>R.  Pt has also had URI symptoms for 2 weeks.

## 2022-05-28 NOTE — ED Notes (Signed)
Pt reports that she has been living out of her car for 2 weeks.

## 2022-07-14 ENCOUNTER — Other Ambulatory Visit: Payer: Self-pay | Admitting: Physician Assistant

## 2023-01-08 ENCOUNTER — Emergency Department (HOSPITAL_BASED_OUTPATIENT_CLINIC_OR_DEPARTMENT_OTHER): Payer: No Typology Code available for payment source

## 2023-01-08 ENCOUNTER — Encounter (HOSPITAL_BASED_OUTPATIENT_CLINIC_OR_DEPARTMENT_OTHER): Payer: Self-pay

## 2023-01-08 ENCOUNTER — Emergency Department (HOSPITAL_BASED_OUTPATIENT_CLINIC_OR_DEPARTMENT_OTHER)
Admission: EM | Admit: 2023-01-08 | Discharge: 2023-01-08 | Discharge status: Against Medical Advice | Payer: No Typology Code available for payment source | Attending: Emergency Medicine | Admitting: Emergency Medicine

## 2023-01-08 ENCOUNTER — Other Ambulatory Visit: Payer: Self-pay

## 2023-01-08 DIAGNOSIS — R519 Headache, unspecified: Secondary | ICD-10-CM | POA: Insufficient documentation

## 2023-01-08 DIAGNOSIS — M791 Myalgia, unspecified site: Secondary | ICD-10-CM | POA: Diagnosis not present

## 2023-01-08 DIAGNOSIS — Z5321 Procedure and treatment not carried out due to patient leaving prior to being seen by health care provider: Secondary | ICD-10-CM | POA: Diagnosis not present

## 2023-01-08 DIAGNOSIS — R509 Fever, unspecified: Secondary | ICD-10-CM | POA: Insufficient documentation

## 2023-01-08 DIAGNOSIS — R079 Chest pain, unspecified: Secondary | ICD-10-CM | POA: Insufficient documentation

## 2023-01-08 LAB — BASIC METABOLIC PANEL
Anion gap: 7 (ref 5–15)
BUN: 21 mg/dL — ABNORMAL HIGH (ref 6–20)
CO2: 25 mmol/L (ref 22–32)
Calcium: 8.8 mg/dL — ABNORMAL LOW (ref 8.9–10.3)
Chloride: 102 mmol/L (ref 98–111)
Creatinine, Ser: 1.17 mg/dL — ABNORMAL HIGH (ref 0.44–1.00)
GFR, Estimated: 59 mL/min — ABNORMAL LOW (ref >60)
Glucose, Bld: 88 mg/dL (ref 70–99)
Potassium: 3.6 mmol/L (ref 3.5–5.1)
Sodium: 134 mmol/L — ABNORMAL LOW (ref 135–145)

## 2023-01-08 LAB — CBC
HCT: 42.4 % (ref 36.0–46.0)
Hemoglobin: 14 g/dL (ref 12.0–15.0)
MCH: 27 pg (ref 26.0–34.0)
MCHC: 33 g/dL (ref 30.0–36.0)
MCV: 81.9 fL (ref 80.0–100.0)
Platelets: 279 10*3/uL (ref 150–400)
RBC: 5.18 MIL/uL — ABNORMAL HIGH (ref 3.87–5.11)
RDW: 15 % (ref 11.5–15.5)
WBC: 5.9 10*3/uL (ref 4.0–10.5)
nRBC: 0 % (ref 0.0–0.2)

## 2023-01-08 LAB — TROPONIN I (HIGH SENSITIVITY): Troponin I (High Sensitivity): <2 ng/L (ref <18)

## 2023-01-08 NOTE — ED Triage Notes (Addendum)
The patient having headache, body aches and fever for two days. She has also been having chest pain.

## 2023-09-19 IMAGING — DX DG KNEE COMPLETE 4+V*L*
4 series · 4 of 4 positions shown · non-contrast
Comparison: None.

CLINICAL DATA: Fall tonight on concrete.  Felt a crack.

EXAM:
LEFT KNEE - COMPLETE 4+ VIEW

[knee ap]
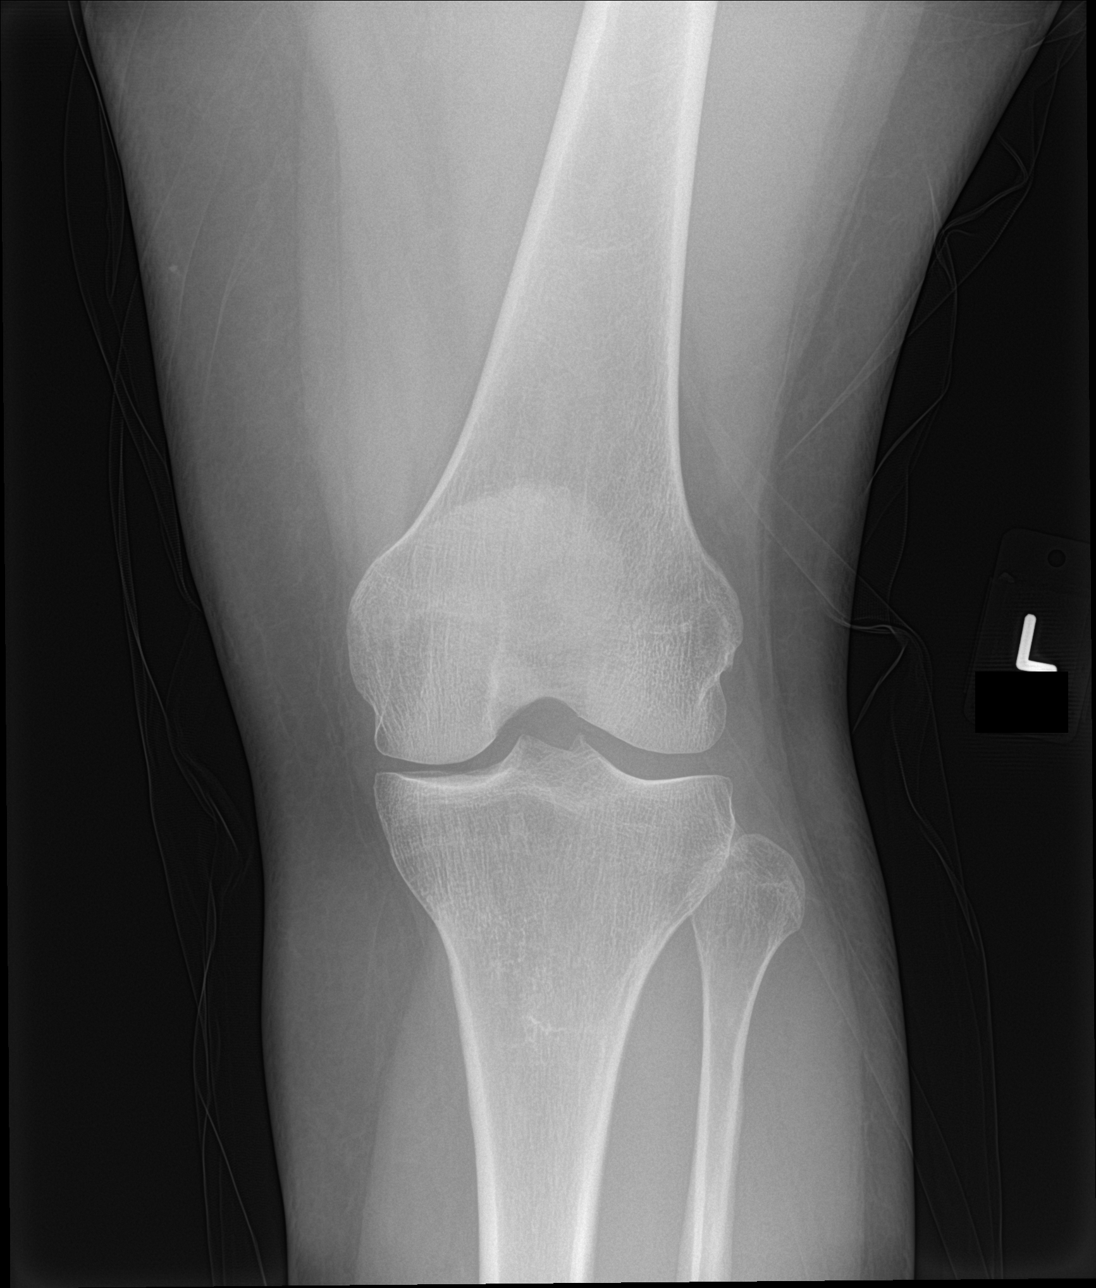

[knee lat]
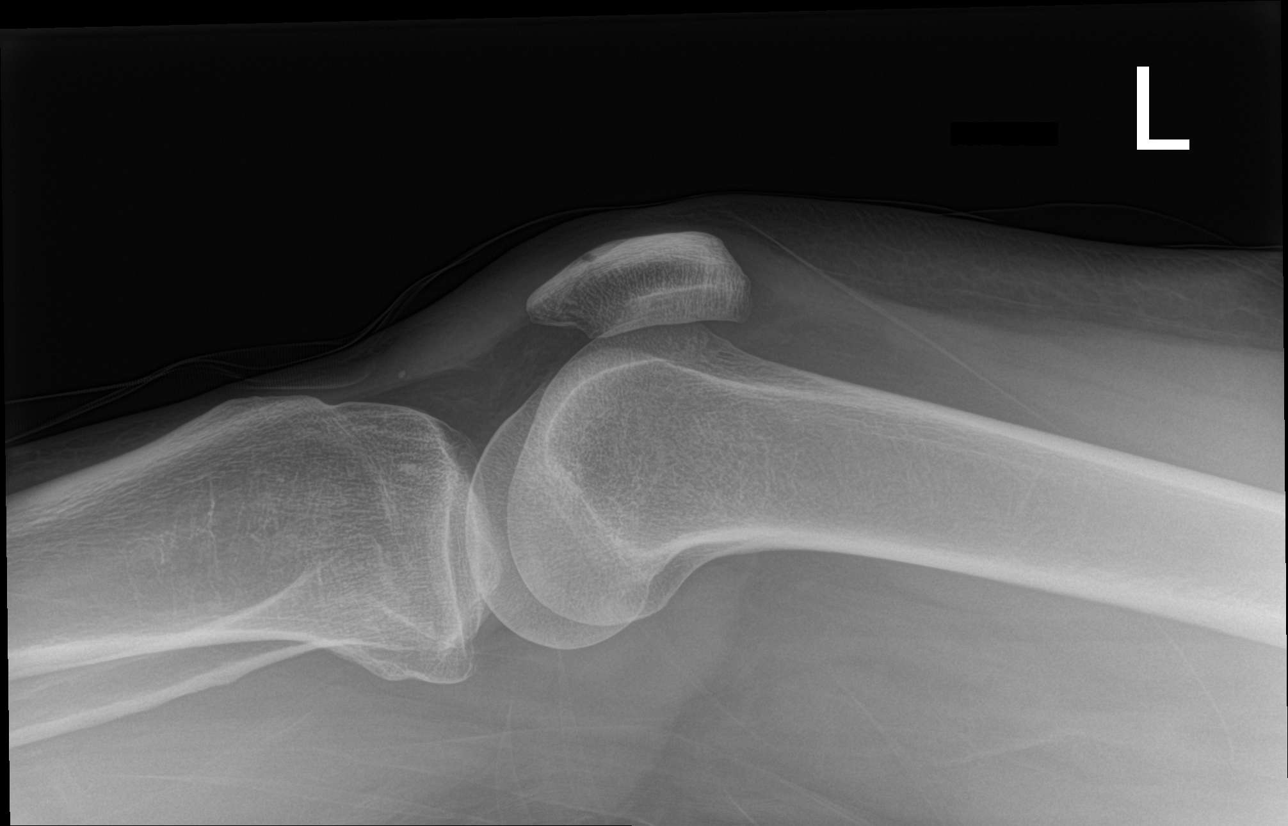

[knee obl (1 of 2)]
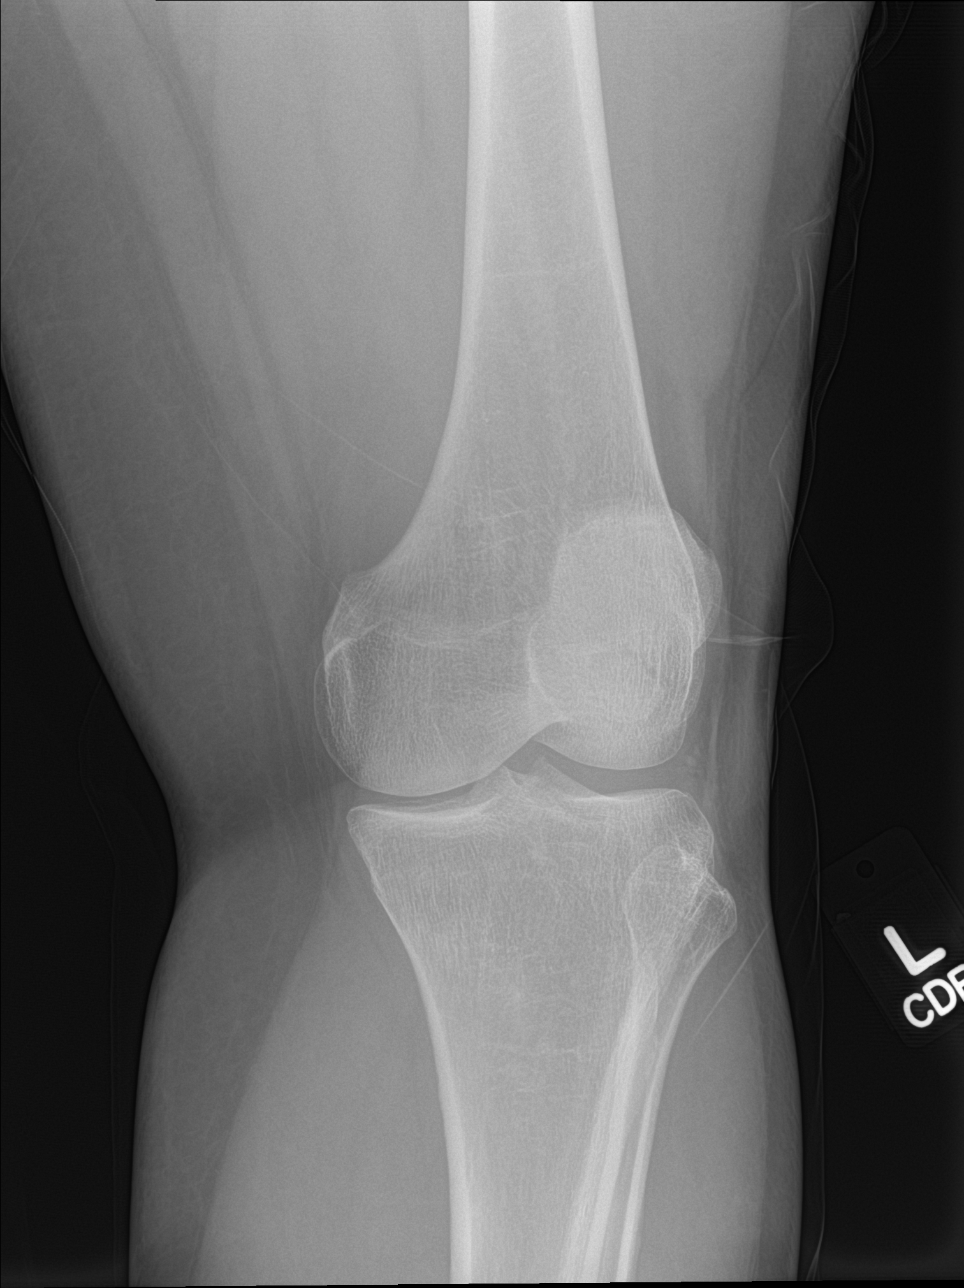

[knee obl (2 of 2)]
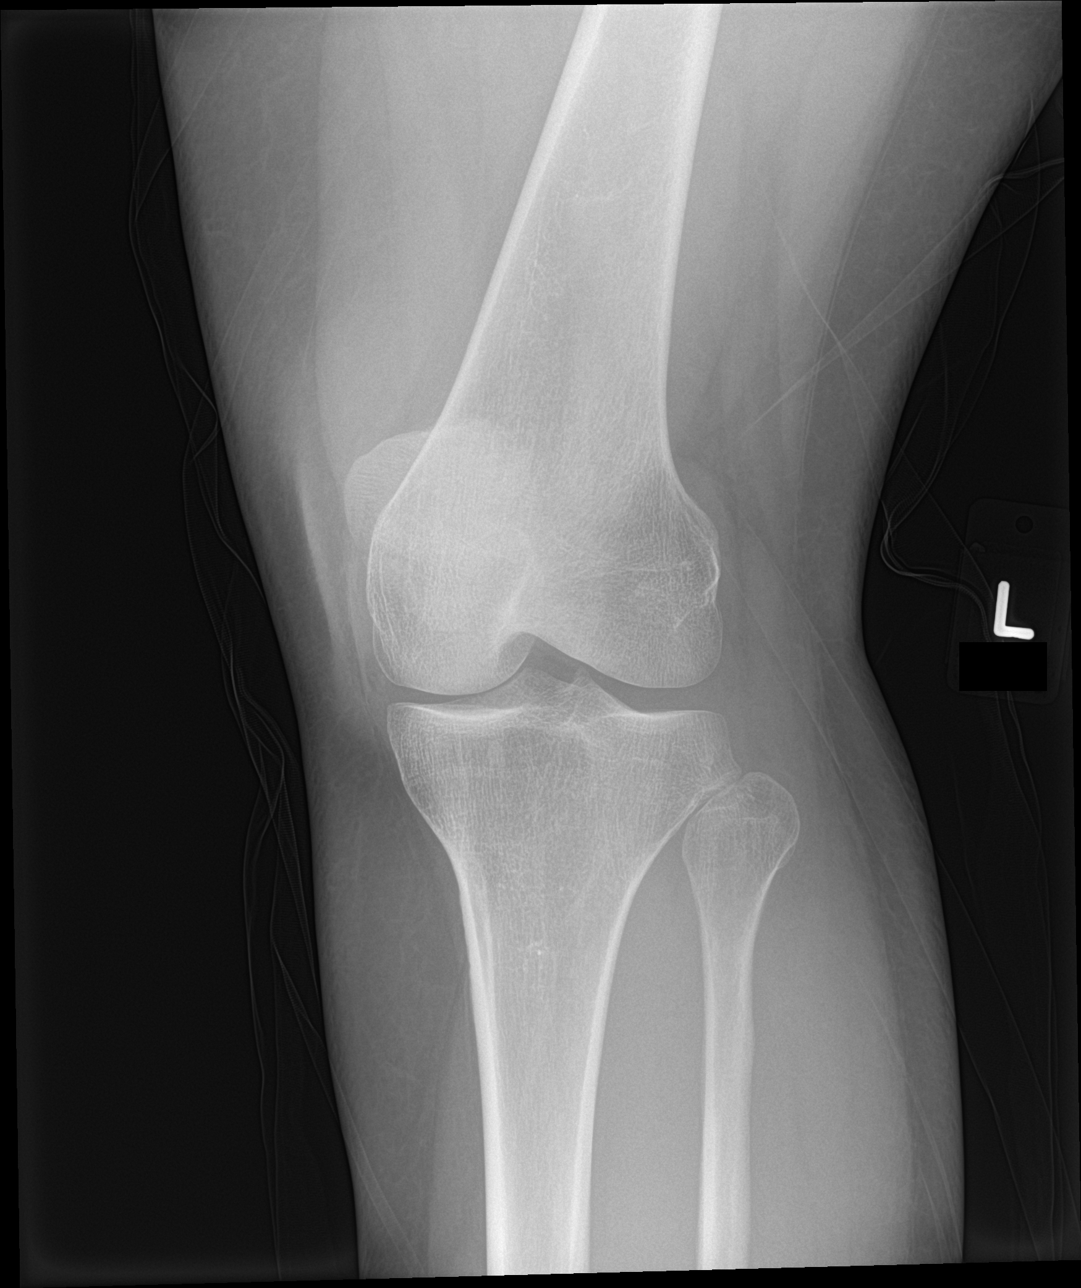

[4 of 4 positions shown; findings below may reference images not displayed]

FINDINGS: Minimal medial tibiofemoral joint space narrowing. Normal alignment.
Suspected nondisplaced fracture through the mid inferior patellar
pole. No other fracture. No significant knee joint effusion. Mild
soft tissue edema anteriorly.
IMPRESSION: Suspected nondisplaced fracture through the mid inferior patellar
pole.
